# Patient Record
Sex: Female | Born: 2000 | Race: Black or African American | Hispanic: No | Marital: Single | Smoking: Never smoker
Health system: Southern US, Community
[De-identification: ages and names within clinical notes are randomized; demographics above are authoritative.]

## PROBLEM LIST (undated history)

## (undated) ENCOUNTER — Inpatient Hospital Stay (HOSPITAL_COMMUNITY): Payer: Self-pay

## (undated) DIAGNOSIS — R55 Syncope and collapse: Secondary | ICD-10-CM

## (undated) DIAGNOSIS — N739 Female pelvic inflammatory disease, unspecified: Secondary | ICD-10-CM

## (undated) DIAGNOSIS — R0600 Dyspnea, unspecified: Secondary | ICD-10-CM

## (undated) DIAGNOSIS — A749 Chlamydial infection, unspecified: Secondary | ICD-10-CM

## (undated) DIAGNOSIS — F419 Anxiety disorder, unspecified: Secondary | ICD-10-CM

## (undated) DIAGNOSIS — N96 Recurrent pregnancy loss: Secondary | ICD-10-CM

## (undated) DIAGNOSIS — F32A Depression, unspecified: Secondary | ICD-10-CM

## (undated) HISTORY — DX: Recurrent pregnancy loss: N96

## (undated) HISTORY — DX: Chlamydial infection, unspecified: A74.9

## (undated) HISTORY — DX: Female pelvic inflammatory disease, unspecified: N73.9

## (undated) HISTORY — DX: Anxiety disorder, unspecified: F41.9

## (undated) HISTORY — PX: NO PAST SURGERIES: SHX2092

---
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
## 2018-06-21 ENCOUNTER — Encounter: Payer: Self-pay | Admitting: Obstetrics & Gynecology

## 2018-06-21 ENCOUNTER — Ambulatory Visit (INDEPENDENT_AMBULATORY_CARE_PROVIDER_SITE_OTHER): Payer: BLUE CROSS/BLUE SHIELD | Admitting: Obstetrics & Gynecology

## 2018-06-21 ENCOUNTER — Encounter: Payer: BLUE CROSS/BLUE SHIELD | Admitting: Obstetrics & Gynecology

## 2018-06-21 ENCOUNTER — Other Ambulatory Visit: Payer: Self-pay

## 2018-06-21 VITALS — BP 102/58 | HR 76 | Resp 18 | Ht 68.5 in | Wt 130.4 lb

## 2018-06-21 DIAGNOSIS — N926 Irregular menstruation, unspecified: Secondary | ICD-10-CM | POA: Diagnosis not present

## 2018-06-21 DIAGNOSIS — Z8619 Personal history of other infectious and parasitic diseases: Secondary | ICD-10-CM | POA: Diagnosis not present

## 2018-06-21 DIAGNOSIS — N946 Dysmenorrhea, unspecified: Secondary | ICD-10-CM

## 2018-06-21 LAB — POCT URINE PREGNANCY: Preg Test, Ur: NEGATIVE

## 2018-06-21 MED ORDER — TRANEXAMIC ACID 650 MG PO TABS: ORAL_TABLET | ORAL | 1 refills | Status: DC

## 2018-06-21 NOTE — Progress Notes (Signed)
17 y.o. G0P0000 Single Black or [place] American female here for new patient annual exam.  Cycles are very irregular.  Can have two cycles in a month and has gone up to 8 weeks between cycles.  Cramping is very severe when she is on her cycle.  Bleeds for 7 full days when she is on her cycle.  With the beginning of her cycle, she will have significant cramping for two to three days.  This is typically when she has the heaviest bleeding.  She will have to change pads every two hours.  Has tried OCPs twice in the past.  Didn't take the pills regularly.  States this did help cycles quite a bit.  Overall, she took the pills for over a year but did miss pills often.  Tries her best to not take medication when she cramps.  Has taken Tramadol and Motrin.  Tramadol has helped the most.    Does have some concerns about being on birth control.  Consider  Currently SA.  Has two partners in the past.  Has hx of chlamydia.  This was 10/18.  Did have repeat testing at Urgent Care but cannot remember which one.  Partner (of five years) was tested but never treated as he was negative.  She had pelvic pain at that time and was diagnosed with PID at that time as well.    Currently not working.  Wants to start school in January for real estate in January.  Will then take her license exam    Patient's last menstrual period was 05/22/2018 (approximate).          Sexually active: Yes.    The current method of family planning is none.    Exercising: No.   Smoker: yes  Health Maintenance: Pap:  Never Gardasil: not completed TDaP:  2010 Screening Labs: PCP   reports that she has been smoking cigarettes. She has never used smokeless tobacco. She reports that she does not drink alcohol or use drugs.  Past Medical History:  Diagnosis Date  . Anxiety   . Chlamydia   . PID (pelvic inflammatory disease)   . Syncope     Past Surgical History:  Procedure Laterality Date  . NO PAST SURGERIES      Current Outpatient  Medications  Medication Sig Dispense Refill  . Biotin 10 MG CAPS Take by mouth daily.    . Multiple Vitamins-Minerals (HAIR SKIN AND NAILS FORMULA PO) Take by mouth daily.     No current facility-administered medications for this visit.     Family History  Problem Relation Age of Onset  . Pancreatitis Mother   . Heart disease Mother   . Pancreatic cancer Mother   . Diabetes Father   . Leukemia Father   . Healthy Brother   . Alzheimer's disease Maternal Grandmother     Review of Systems  All other systems reviewed and are negative.   Exam:   BP (!) 102/58 (BP Location: Right Arm, Patient Position: Sitting, Cuff Size: Normal)   Pulse 76   Resp 18   Ht 5' 8.5" (1.74 m)   Wt 130 lb 6.4 oz (59.1 kg)   LMP 05/22/2018 (Approximate)   BMI 19.54 kg/m   Height: 5' 8.5" (174 cm)  Ht Readings from Last 3 Encounters:  06/21/18 5' 8.5" (1.74 m) (95 %, Z= 1.65)*  03/16/17 5\' 6"  (1.676 m) (75 %, Z= 0.69)*   * Growth percentiles are based on CDC (Girls, 2-20 Years) data.  General appearance: alert, cooperative and appears stated age Head: Normocephalic, without obvious abnormality, atraumatic Lungs: clear to auscultation bilaterally Heart: regular rate and rhythm Neurologic: Grossly normal  Pelvic:not performed today  A:  Dysmenorrhea, desires Tramadol H/O chlamydia  P:   Options for treatment discussed including OCPs, POPs, depo provera, IUDs, Nexplanon, Nuva ring and Evra patch.  Concerns addressed.  She has some mis-information that she read that we did discuss.  Wants to do some additional research D/w pt Tramadol medication and my concerns about her using this monthly and long term.  Will not plan to use this at this time. Will try Lysteda 1300mg  tid with bleeding and until bleeding stops.  #30/1RF.  Pt will give update.   GC/Chl/trich testing obtained  ~30 minutes spent with patient >50% of time was in face to face discussion of above.

## 2018-06-22 LAB — CHLAMYDIA/GONOCOCCUS/TRICHOMONAS, NAA
Chlamydia by NAA: NEGATIVE
Gonococcus by NAA: NEGATIVE
Trich vag by NAA: NEGATIVE

## 2018-06-24 ENCOUNTER — Telehealth: Payer: Self-pay | Admitting: Obstetrics & Gynecology

## 2018-06-24 MED ORDER — NAPROXEN 500 MG PO TABS: 500.0000 mg | ORAL_TABLET | Freq: Two times a day (BID) | ORAL | 0 refills | Status: DC

## 2018-06-24 NOTE — Telephone Encounter (Signed)
Call to patient. Patient states that she started her cycle this morning around 4 am and that the "cramps have been really bad. I'm not able to do anything." Patient states she has thrown up twice and is not able to eat anything. Patient states she took the lysteda around 11am. States the medicine "helped the bleeding, but didn't do anything for the cramps." She is changing her pad about every 2-3 hours and not saturated when she changes. OV offered to patient, but she declined wondering if there is anything Dr. Hyacinth Meeker can prescribe for pain? RN advised would need to review with Dr. Hyacinth Meeker and return call. Patient agreeable. Bleeding/pain precautions reviewed with patient and she verbalized understanding.   Routing to provider for review.

## 2018-06-24 NOTE — Telephone Encounter (Signed)
Patient would like to discuss her cycle with a nurse.

## 2018-06-24 NOTE — Telephone Encounter (Signed)
She needs to keep taking the Lysteda 3 times daily.  She wants Tramadol for the pain which I do not feel comfortable prescribing for her.  We could try Naprosyn 500mg  bid #30/0RF.  She declined all other treatment options including OCPs, POPs, other progesterone methods and IUD use.

## 2018-06-24 NOTE — Telephone Encounter (Signed)
Call to patient. Message given to patient as seen below from Dr. Hyacinth Meeker. Patient verbalized understanding. Pharmacy confirmed as [facility] on [address]. Instructions on use of naprosyn reviewed with patient and she verbalized understanding. Advised patient that should pain continue/worsen, patient would need to seek Urgent/ER care. Patient verbalized understanding and agreeable.   RX for Naprosyn 500mg  bid, #30/0RF sent to [facility] on [address] per patient request.   Routing to provider and will close encounter.

## 2019-03-28 ENCOUNTER — Ambulatory Visit: Payer: Self-pay | Admitting: Internal Medicine

## 2019-03-28 ENCOUNTER — Encounter: Payer: Self-pay | Admitting: Internal Medicine

## 2019-03-28 DIAGNOSIS — R21 Rash and other nonspecific skin eruption: Secondary | ICD-10-CM

## 2019-03-28 NOTE — Progress Notes (Signed)
Patient ID: Sydney Woods, female   DOB: 12-13-2000, 18 y.o.   MRN: [id number]  Error  = pt no show for appt

## 2019-03-28 NOTE — Patient Instructions (Signed)
Error = pt no show for appt

## 2019-08-02 ENCOUNTER — Ambulatory Visit (HOSPITAL_COMMUNITY)
Admission: EM | Admit: 2019-08-02 | Discharge: 2019-08-02 | Disposition: A | Discharge status: Home / Self Care | Payer: BC Managed Care – PPO | Attending: Emergency Medicine | Admitting: Emergency Medicine

## 2019-08-02 ENCOUNTER — Other Ambulatory Visit: Payer: Self-pay

## 2019-08-02 NOTE — ED Notes (Signed)
Sydney Woods, patient access , reports patient is going to her provider.

## 2019-08-03 ENCOUNTER — Other Ambulatory Visit: Payer: Self-pay

## 2019-08-04 ENCOUNTER — Ambulatory Visit (INDEPENDENT_AMBULATORY_CARE_PROVIDER_SITE_OTHER): Payer: BC Managed Care – PPO | Admitting: Certified Nurse Midwife

## 2019-08-04 ENCOUNTER — Other Ambulatory Visit: Payer: Self-pay

## 2019-08-04 ENCOUNTER — Encounter: Payer: Self-pay | Admitting: Certified Nurse Midwife

## 2019-08-04 VITALS — BP 118/70 | HR 72 | Temp 97.2°F | Resp 16 | Wt 125.0 lb

## 2019-08-04 DIAGNOSIS — N912 Amenorrhea, unspecified: Secondary | ICD-10-CM | POA: Diagnosis not present

## 2019-08-04 LAB — POCT URINE PREGNANCY: Preg Test, Ur: POSITIVE — AB

## 2019-08-04 NOTE — Patient Instructions (Signed)
Morning Sickness  Morning sickness is when you feel sick to your stomach (nauseous) during pregnancy. You may feel sick to your stomach and throw up (vomit). You may feel sick in the morning, but you can feel this way at any time of day. Some women feel very sick to their stomach and cannot stop throwing up (hyperemesis gravidarum). Follow these instructions at home: Medicines  Take over-the-counter and prescription medicines only as told by your doctor. Do not take any medicines until you talk with your doctor about them first.  Taking multivitamins before getting pregnant can stop or lessen the harshness of morning sickness. Eating and drinking  Eat dry toast or crackers before getting out of bed.  Eat 5 or 6 small meals a day.  Eat dry and bland foods like [place] and baked potatoes.  Do not eat greasy, fatty, or spicy foods.  Have someone cook for you if the smell of food causes you to feel sick or throw up.  If you feel sick to your stomach after taking prenatal vitamins, take them at night or with a snack.  Eat protein when you need a snack. Nuts, yogurt, and cheese are good choices.  Drink fluids throughout the day.  Try ginger ale made with real ginger, ginger tea made from fresh grated ginger, or ginger candies. General instructions  Do not use any products that have nicotine or tobacco in them, such as cigarettes and e-cigarettes. If you need help quitting, ask your doctor.  Use an air purifier to keep the air in your house free of smells.  Get lots of fresh air.  Try to avoid smells that make you feel sick.  Try: ? Wearing a bracelet that is used for seasickness (acupressure wristband). ? Going to a doctor who puts thin needles into certain body points (acupuncture) to improve how you feel. Contact a doctor if:  You need medicine to feel better.  You feel dizzy or light-headed.  You are losing weight. Get help right away if:  You feel very sick to your  stomach and cannot stop throwing up.  You pass out (faint).  You have very bad pain in your belly. Summary  Morning sickness is when you feel sick to your stomach (nauseous) during pregnancy.  You may feel sick in the morning, but you can feel this way at any time of day.  Making some changes to what you eat may help your symptoms go away. This information is not intended to replace advice given to you by your health care provider. Make sure you discuss any questions you have with your health care provider. Document Released: 10/08/2004 Document Revised: 08/13/2017 Document Reviewed: 10/01/2016 Elsevier Patient Education  2020 Elsevier Inc.  

## 2019-08-04 NOTE — Progress Notes (Signed)
18 y.o. single african [place] female g0p0 presents with amenorrhea with + UPT on 08/01/2019 and then on 08/02/2019. + UPT here today. LMP End of September , but not sure of date. Had no period in 06/2019. Has not used contraception in entire time with partner of 7 years. Complaining of breast tenderness, fatigue, nausea with one occasion of vomiting. Denies spotting, bleeding or cramping. Medications she is taking are prenatal vitamins, recently started. Patient has not consumed alcohol since + UPT. Has smoked marijuana in the past, but not since + UPT. Partner supportive and excited. Anxious to know how far along her pregnancy is. No other concerns today.  Review of Systems  Constitutional: Negative.   HENT: Negative.   Eyes: Negative.   Respiratory: Negative.   Cardiovascular: Negative.   Gastrointestinal: Positive for nausea.       Vomiting x 1 only  Genitourinary: Negative.   Musculoskeletal: Negative.   Skin: Negative.   Neurological: Negative.   Endo/Heme/Allergies: Negative.   Psychiatric/Behavioral: Negative.     O: HPI pertinent to above. Healthy WDWN female Affect: normal, orientation x 3  Last OV: 06/21/18 only for dysmenorrhea Pap smear: not file     Rubella screen: not file  A: Amenorrhea with positive UPT  07/01/2019   ?? 4- 6 weeks per  ? LNMP  Unplanned pregnancy, but excited, partner supportive History of Chlamydia and PID, needs early confirmation in uterus to risk of ectopic.   P: Reviewed with patient importance of prenatal care during pregnancy. Given OB provider list. Reviewed nutrition importance of pregnancy and selecting from all food groups and making sure to have adequate protein intake daily. Discussed avoiding raw or exotic fish, soft cheeses due to risk of bacteria . Discussed concerns with FAS with alcohol use in pregnancy. Discussed increase of IUGR and SIDS with smoking use or second smoke. Reviewed warning signs of early pregnancy and need to advise  if occurs. Discussed comfort measures for early pregnancy changes, such as nausea and vomiting. Discussed small frequent meals better tolerated. If concerns with vomiting needs to advise. Offered  PUS here prior to determine dating prior to initiating prenatal care. Patient would like  to have PUS. She will be called with insurance information and scheduled. Questions addressed at length.   Rv prn         38 minutes spent in time spent with patient in face to face counseling regarding pregnancy, prenatal care, and comfort measures.

## 2019-08-05 ENCOUNTER — Emergency Department (HOSPITAL_COMMUNITY)
Admission: EM | Admit: 2019-08-05 | Discharge: 2019-08-05 | Disposition: A | Discharge status: Home / Self Care | Payer: BC Managed Care – PPO | Attending: Emergency Medicine | Admitting: Emergency Medicine

## 2019-08-05 ENCOUNTER — Emergency Department (HOSPITAL_COMMUNITY): Payer: BC Managed Care – PPO

## 2019-08-05 ENCOUNTER — Other Ambulatory Visit: Payer: Self-pay

## 2019-08-05 ENCOUNTER — Encounter (HOSPITAL_COMMUNITY): Payer: Self-pay

## 2019-08-05 DIAGNOSIS — Z3A01 Less than 8 weeks gestation of pregnancy: Secondary | ICD-10-CM | POA: Insufficient documentation

## 2019-08-05 DIAGNOSIS — Z79899 Other long term (current) drug therapy: Secondary | ICD-10-CM | POA: Insufficient documentation

## 2019-08-05 DIAGNOSIS — Z87891 Personal history of nicotine dependence: Secondary | ICD-10-CM | POA: Diagnosis not present

## 2019-08-05 DIAGNOSIS — B9689 Other specified bacterial agents as the cause of diseases classified elsewhere: Secondary | ICD-10-CM | POA: Insufficient documentation

## 2019-08-05 DIAGNOSIS — O200 Threatened abortion: Secondary | ICD-10-CM | POA: Diagnosis not present

## 2019-08-05 DIAGNOSIS — O209 Hemorrhage in early pregnancy, unspecified: Secondary | ICD-10-CM | POA: Diagnosis not present

## 2019-08-05 DIAGNOSIS — O23591 Infection of other part of genital tract in pregnancy, first trimester: Secondary | ICD-10-CM | POA: Diagnosis not present

## 2019-08-05 DIAGNOSIS — O23599 Infection of other part of genital tract in pregnancy, unspecified trimester: Secondary | ICD-10-CM | POA: Insufficient documentation

## 2019-08-05 DIAGNOSIS — N939 Abnormal uterine and vaginal bleeding, unspecified: Secondary | ICD-10-CM

## 2019-08-05 LAB — CBC
HCT: 43.1 % (ref 36.0–46.0)
Hemoglobin: 14.2 g/dL (ref 12.0–15.0)
MCH: 29.8 pg (ref 26.0–34.0)
MCHC: 32.9 g/dL (ref 30.0–36.0)
MCV: 90.5 fL (ref 80.0–100.0)
Platelets: 287 10*3/uL (ref 150–400)
RBC: 4.76 MIL/uL (ref 3.87–5.11)
RDW: 13.4 % (ref 11.5–15.5)
WBC: 5.2 10*3/uL (ref 4.0–10.5)
nRBC: 0 % (ref 0.0–0.2)

## 2019-08-05 LAB — URINALYSIS, ROUTINE W REFLEX MICROSCOPIC
Bilirubin Urine: NEGATIVE
Glucose, UA: NEGATIVE mg/dL
Hgb urine dipstick: NEGATIVE
Ketones, ur: 5 mg/dL — AB
Leukocytes,Ua: NEGATIVE
Nitrite: NEGATIVE
Protein, ur: NEGATIVE mg/dL
Specific Gravity, Urine: 1.018 (ref 1.005–1.030)
pH: 5 (ref 5.0–8.0)

## 2019-08-05 LAB — BASIC METABOLIC PANEL
Anion gap: 12 (ref 5–15)
BUN: 9 mg/dL (ref 6–20)
CO2: 20 mmol/L — ABNORMAL LOW (ref 22–32)
Calcium: 10 mg/dL (ref 8.9–10.3)
Chloride: 104 mmol/L (ref 98–111)
Creatinine, Ser: 0.76 mg/dL (ref 0.44–1.00)
GFR calc Af Amer: >60 mL/min (ref >60)
GFR calc non Af Amer: >60 mL/min (ref >60)
Glucose, Bld: 80 mg/dL (ref 70–99)
Potassium: 3.4 mmol/L — ABNORMAL LOW (ref 3.5–5.1)
Sodium: 136 mmol/L (ref 135–145)

## 2019-08-05 LAB — HCG, QUANTITATIVE, PREGNANCY: hCG, Beta Chain, Quant, S: 7675 m[IU]/mL — ABNORMAL HIGH (ref <5)

## 2019-08-05 LAB — WET PREP, GENITAL
Sperm: NONE SEEN
Trich, Wet Prep: NONE SEEN
Yeast Wet Prep HPF POC: NONE SEEN

## 2019-08-05 LAB — HIV ANTIBODY (ROUTINE TESTING W REFLEX): HIV Screen 4th Generation wRfx: NONREACTIVE

## 2019-08-05 LAB — ABO/RH: ABO/RH(D): O POS

## 2019-08-05 MED ORDER — METRONIDAZOLE 500 MG PO TABS: 500.0000 mg | ORAL_TABLET | Freq: Two times a day (BID) | ORAL | 0 refills | Status: DC

## 2019-08-05 NOTE — ED Triage Notes (Signed)
Pt presents with c/o vaginal bleeding and some cramping. Pt reports that she is pregnant, just recently found out.

## 2019-08-05 NOTE — ED Provider Notes (Signed)
[facility]-EMERGENCY DEPT Provider Note   CSN: [id number] Arrival date & time: 08/05/19  1132     History   Chief Complaint Chief Complaint  Patient presents with   Vaginal Bleeding    HPI Sydney Woods is a 18 y.o. female.     HPI Patient presents with vaginal bleeding and cramping.  Recently found out she is pregnant.  Including confirmatory pregnancy test yesterday.  Last menses was around 2 months ago.  However today had some mild vaginal spotting and had some cramping.  No lightheadedness weakness.  Did not have pelvic exam done yesterday.  Does not know her or her boyfriend's blood type.  This is her first pregnancy. Past Medical History:  Diagnosis Date   Anxiety    Chlamydia    PID (pelvic inflammatory disease)    Syncope     Patient Active Problem List   Diagnosis Date Noted   Rash and nonspecific skin eruption 12/14/2017   Microscopic hematuria 04/13/2017   Cervicitis 04/13/2017   History of chlamydia 04/13/2017   Menstrual cramps 03/16/2017   Syncope 10/23/2012    Past Surgical History:  Procedure Laterality Date   NO PAST SURGERIES       OB History    Gravida  1   Para  0   Term  0   Preterm  0   AB  0   Living  0     SAB  0   TAB  0   Ectopic  0   Multiple  0   Live Births  0            Home Medications    Prior to Admission medications   Medication Sig Start Date End Date Taking? Authorizing Provider  Prenatal Vit-Fe Fumarate-FA (PRENATAL VITAMIN PO) Take by mouth.   Yes [provider]  metroNIDAZOLE (FLAGYL) 500 MG tablet Take 1 tablet (500 mg total) by mouth 2 (two) times daily. 08/05/19   Benjiman CorePickering, Ignace Mandigo, MD    Family History Family History  Problem Relation Age of Onset   Pancreatitis Mother    Heart disease Mother    Pancreatic cancer Mother    Diabetes Father    Leukemia Father    Healthy Brother    Alzheimer's disease Maternal Grandmother     Social  History Social History   Tobacco Use   Smoking status: Former Smoker    Types: Cigarettes   Smokeless tobacco: Never Used  Substance Use Topics   Alcohol use: No   Drug use: No     Allergies   Tomato flavor [flavoring agent] and Citrus   Review of Systems Review of Systems  Constitutional: Negative for appetite change.  Respiratory: Negative for shortness of breath.   Gastrointestinal: Positive for abdominal pain.  Genitourinary: Positive for vaginal bleeding.  Musculoskeletal: Negative for arthralgias.  Skin: Negative for pallor.  Neurological: Negative for weakness.  Psychiatric/Behavioral: Negative for agitation.     Physical Exam Updated Vital Signs BP 131/72    Pulse 83    Temp 99 F (37.2 C) (Oral)    Resp 16    Ht 5\' 8"  (1.727 m)    Wt 56.7 kg    LMP 06/09/2019 (Approximate)    SpO2 90%    BMI 19.01 kg/m   Physical Exam Vitals signs and nursing note reviewed.  HENT:     Head: Normocephalic.  Eyes:     Pupils: Pupils are equal, round, and reactive to light.  Cardiovascular:     Rate and Rhythm: Regular rhythm.  Abdominal:     Tenderness: There is no abdominal tenderness.  Musculoskeletal:     Right lower leg: No edema.     Left lower leg: No edema.  Skin:    General: Skin is warm.     Capillary Refill: Capillary refill takes less than 2 seconds.  Neurological:     Mental Status: She is alert and oriented to person, place, and time.   Pelvic exam showed closed os.  Some cervical bleeding.  No cervical motion tenderness.   ED Treatments / Results  Labs (all labs ordered are listed, but only abnormal results are displayed) Labs Reviewed  WET PREP, GENITAL - Abnormal; Notable for the following components:      Result Value   Clue Cells Wet Prep HPF POC PRESENT (*)    WBC, Wet Prep HPF POC FEW (*)    All other components within normal limits  HCG, QUANTITATIVE, PREGNANCY - Abnormal; Notable for the following components:   hCG, Beta Chain,  Quant, S 7,675 (*)    All other components within normal limits  BASIC METABOLIC PANEL - Abnormal; Notable for the following components:   Potassium 3.4 (*)    CO2 20 (*)    All other components within normal limits  URINALYSIS, ROUTINE W REFLEX MICROSCOPIC - Abnormal; Notable for the following components:   Ketones, ur 5 (*)    All other components within normal limits  CBC  RPR  HIV ANTIBODY (ROUTINE TESTING W REFLEX)  ABO/RH  GC/CHLAMYDIA PROBE AMP ([place]) NOT AT [facility]    EKG None  Radiology US Ob Comp < 14 Wks  Result Date: 08/05/2019 CLINICAL DATA:  18 year old female reportedly pregnant with vaginal bleeding. Quantitative beta HCG in process. EXAM: OBSTETRIC <14 WK [place] AND TRANSVAGINAL OB US DOPPLER ULTRASOUND OF OVARIES TECHNIQUE: Both transabdominal and transvaginal ultrasound examinations were performed for complete evaluation of the gestation as well as the maternal uterus, adnexal regions, and pelvic cul-de-sac. Transvaginal technique was performed to assess early pregnancy. Color and duplex Doppler ultrasound was utilized to evaluate blood flow to the ovaries. COMPARISON:  None. FINDINGS: Intrauterine gestational sac: Single Yolk sac:  Not Visualized. Embryo:  Not Visualized. Cardiac Activity: Not Visualized. MSD: 7.6 mm   5 w   3 d Subchorionic hemorrhage:  None visualized. Maternal uterus/adnexae: Small amount of fluid visualized within the cervix consistent with the clinical history of vaginal bleeding. The endometrium is heterogeneously thickened. Simple follicular cyst present in the left ovary measuring up to 2.0 cm. Trace free fluid in the pelvic cul-de-sac, likely physiologic. Pulsed Doppler evaluation of both ovaries demonstrates normal appearing low-resistance arterial and venous waveforms. IMPRESSION: Probable early intrauterine gestational sac, but no yolk sac, fetal pole, or cardiac activity yet visualized. Recommend follow-up quantitative B-HCG levels and  follow-up US in 14 days to assess viability. This recommendation follows SRU consensus guidelines: Diagnostic Criteria for Nonviable Pregnancy Early in the First Trimester. [place] Med 2013; 376:2831-51. Electronically Signed   By: Jacqulynn Cadet M.D.   On: 08/05/2019 13:12   US Ob Transvaginal  Result Date: 08/05/2019 CLINICAL DATA:  18 year old female reportedly pregnant with vaginal bleeding. Quantitative beta HCG in process. EXAM: OBSTETRIC <14 WK [place] AND TRANSVAGINAL OB US DOPPLER ULTRASOUND OF OVARIES TECHNIQUE: Both transabdominal and transvaginal ultrasound examinations were performed for complete evaluation of the gestation as well as the maternal uterus, adnexal regions, and pelvic cul-de-sac. Transvaginal technique was  performed to assess early pregnancy. Color and duplex Doppler ultrasound was utilized to evaluate blood flow to the ovaries. COMPARISON:  None. FINDINGS: Intrauterine gestational sac: Single Yolk sac:  Not Visualized. Embryo:  Not Visualized. Cardiac Activity: Not Visualized. MSD: 7.6 mm   5 w   3 d Subchorionic hemorrhage:  None visualized. Maternal uterus/adnexae: Small amount of fluid visualized within the cervix consistent with the clinical history of vaginal bleeding. The endometrium is heterogeneously thickened. Simple follicular cyst present in the left ovary measuring up to 2.0 cm. Trace free fluid in the pelvic cul-de-sac, likely physiologic. Pulsed Doppler evaluation of both ovaries demonstrates normal appearing low-resistance arterial and venous waveforms. IMPRESSION: Probable early intrauterine gestational sac, but no yolk sac, fetal pole, or cardiac activity yet visualized. Recommend follow-up quantitative B-HCG levels and follow-up US in 14 days to assess viability. This recommendation follows SRU consensus guidelines: Diagnostic Criteria for Nonviable Pregnancy Early in the First Trimester. Malva Limes Med 2013; 161:0960-45. Electronically Signed   By: Malachy Moan  M.D.   On: 08/05/2019 13:12   US Pelvic Doppler (torsion R/o Or Mass Arterial Flow)  Result Date: 08/05/2019 CLINICAL DATA:  18 year old female reportedly pregnant with vaginal bleeding. Quantitative beta HCG in process. EXAM: OBSTETRIC <14 WK [place] AND TRANSVAGINAL OB US DOPPLER ULTRASOUND OF OVARIES TECHNIQUE: Both transabdominal and transvaginal ultrasound examinations were performed for complete evaluation of the gestation as well as the maternal uterus, adnexal regions, and pelvic cul-de-sac. Transvaginal technique was performed to assess early pregnancy. Color and duplex Doppler ultrasound was utilized to evaluate blood flow to the ovaries. COMPARISON:  None. FINDINGS: Intrauterine gestational sac: Single Yolk sac:  Not Visualized. Embryo:  Not Visualized. Cardiac Activity: Not Visualized. MSD: 7.6 mm   5 w   3 d Subchorionic hemorrhage:  None visualized. Maternal uterus/adnexae: Small amount of fluid visualized within the cervix consistent with the clinical history of vaginal bleeding. The endometrium is heterogeneously thickened. Simple follicular cyst present in the left ovary measuring up to 2.0 cm. Trace free fluid in the pelvic cul-de-sac, likely physiologic. Pulsed Doppler evaluation of both ovaries demonstrates normal appearing low-resistance arterial and venous waveforms. IMPRESSION: Probable early intrauterine gestational sac, but no yolk sac, fetal pole, or cardiac activity yet visualized. Recommend follow-up quantitative B-HCG levels and follow-up US in 14 days to assess viability. This recommendation follows SRU consensus guidelines: Diagnostic Criteria for Nonviable Pregnancy Early in the First Trimester. Malva Limes Med 2013; 409:8119-14. Electronically Signed   By: Malachy Moan M.D.   On: 08/05/2019 13:12    Procedures Procedures (including critical care time)  Medications Ordered in ED Medications - No data to display   Initial Impression / Assessment and Plan / ED Course  I  have reviewed the triage vital signs and the nursing notes.  Pertinent labs & imaging results that were available during my care of the patient were reviewed by me and considered in my medical decision making (see chart for details).        Patient presents with vaginal bleeding and cramping. Known pregnancy test but not had quantitative done.  Sydney Woods is 7600.  Ultrasound showed potential intrauterine pregnancy but no yolk sac.  Has bacterial vaginosis.  Will treat.  Urine does not show infection.  Will have follow-up with GYN/OB  Final Clinical Impressions(s) / ED Diagnoses   Final diagnoses:  Threatened miscarriage  Bacterial vaginosis in pregnancy    ED Discharge Orders         Ordered  metroNIDAZOLE (FLAGYL) 500 MG tablet  2 times daily     08/05/19 1422           Benjiman Core, MD 08/05/19 1436

## 2019-08-05 NOTE — ED Notes (Signed)
Urine culture sent down to lab with urinalysis. 

## 2019-08-06 LAB — RPR: RPR Ser Ql: NONREACTIVE

## 2019-08-07 ENCOUNTER — Other Ambulatory Visit: Payer: Self-pay

## 2019-08-07 ENCOUNTER — Other Ambulatory Visit: Payer: Self-pay | Admitting: Certified Nurse Midwife

## 2019-08-07 ENCOUNTER — Other Ambulatory Visit (INDEPENDENT_AMBULATORY_CARE_PROVIDER_SITE_OTHER): Payer: BC Managed Care – PPO

## 2019-08-07 ENCOUNTER — Telehealth: Payer: Self-pay | Admitting: *Deleted

## 2019-08-07 ENCOUNTER — Telehealth: Payer: Self-pay | Admitting: Certified Nurse Midwife

## 2019-08-07 DIAGNOSIS — N926 Irregular menstruation, unspecified: Secondary | ICD-10-CM

## 2019-08-07 DIAGNOSIS — Z3201 Encounter for pregnancy test, result positive: Secondary | ICD-10-CM

## 2019-08-07 DIAGNOSIS — O209 Hemorrhage in early pregnancy, unspecified: Secondary | ICD-10-CM

## 2019-08-07 DIAGNOSIS — O0281 Inappropriate change in quantitative human chorionic gonadotropin (hCG) in early pregnancy: Secondary | ICD-10-CM

## 2019-08-07 LAB — BETA HCG QUANT (REF LAB): hCG Quant: 6673 m[IU]/mL

## 2019-08-07 NOTE — Telephone Encounter (Signed)
Spoke with pts mother. Ok per [employer]. Pt in court as we spoke. Mother Kennyth Lose)  states pt needs f/u appt from ED on Saturday for bleeding with pregnancy. Spoke with Debbi, CNM and pt needs STAT beta HCG today. Lab visit scheduled 11/23 at 1:45pm per pts request bc in court this morning. Pt to call back when out of court to talk to triage to evaluate sx and to confirm lab visit. Pts mother agreeable.   Will route to French Ana, CNM for review. STAT beta HCG order placed.

## 2019-08-07 NOTE — Telephone Encounter (Signed)
Pt called back. Spoke with pt. Pt states having nausea and vomiting x 2 yesterday. Pt states spotting/ light flow and dark in color. Pt has been in court this morning and wasn't able to have lab appt for STAT Beta HCG until this afternoon. Confirmed lab appt 11/23 at 1:45 and arrival at 130pm Pt agreeable.  Pt states having been dx with BV on Sat at ED. Has been prescribed Flagyl. Pt states has only taken 1 pill yesterday bc of vomiting. Please advise on med.   Will route to D. Hollice Espy, CNM

## 2019-08-07 NOTE — Telephone Encounter (Signed)
She needs to stop medication, I do not recommend Flagyl in early pregnancy

## 2019-08-07 NOTE — Telephone Encounter (Signed)
Patient was seen at the ED department on Saturday for bleeding with pregnancy.

## 2019-08-07 NOTE — Telephone Encounter (Signed)
Left message to call Sharee Pimple, RN at [facility].    OV 08/04/19: positive UPT ER 08/05/19; HCG 1,157 PUS: IMPRESSION: Probable early intrauterine gestational sac, but no yolk sac, fetal pole, or cardiac activity yet visualized. Recommend follow-up quantitative B-HCG levels and follow-up US in 14 days to assess viability. This recommendation follows SRU consensus guidelines: Diagnostic Criteria for Nonviable Pregnancy Early in the First Trimester.   Hx: Chlamydia and PID O positive

## 2019-08-07 NOTE — Telephone Encounter (Signed)
Pt in office lab getting Stat beta hCG drawn. Spoke with pt. Pt instructed to stop taking Flagyl d/t early pregnancy. Pt verbalized understanding. Pt spoken to in office by D. Hollice Espy, CNM to go over heavy bleeding precautions re: spotting that pt reports. Pt verbalized understanding. Will call pt back when STAT beta hCG results in and make further plan of care. Pt agreeable.   Will route to D. Hollice Espy, CNM for review and close encounter.

## 2019-08-07 NOTE — Telephone Encounter (Signed)
STAT Beta Hcg [id number].  Reviewed with Melvia Heaps, CNM, call placed to patient. Repeat STAT beta Hcg scheduled for 08/09/19 at 8:55am. ER precautions reviewed for heavy bleeding, pain, N/V. Patient verbalizes understanding and is agreeable.   Future lab order placed.   Routing to provider for final review. Patient is agreeable to disposition. Will close encounter.  Cc: Dr. Quincy Simmonds

## 2019-08-08 ENCOUNTER — Telehealth: Payer: Self-pay | Admitting: *Deleted

## 2019-08-08 ENCOUNTER — Other Ambulatory Visit (HOSPITAL_COMMUNITY)
Admission: RE | Admit: 2019-08-08 | Discharge: 2019-08-08 | Disposition: A | Discharge status: Home / Self Care | Payer: BC Managed Care – PPO | Source: Ambulatory Visit | Attending: Obstetrics and Gynecology | Admitting: Obstetrics and Gynecology

## 2019-08-08 ENCOUNTER — Ambulatory Visit (INDEPENDENT_AMBULATORY_CARE_PROVIDER_SITE_OTHER): Payer: BC Managed Care – PPO | Admitting: Obstetrics and Gynecology

## 2019-08-08 ENCOUNTER — Encounter: Payer: Self-pay | Admitting: Obstetrics and Gynecology

## 2019-08-08 ENCOUNTER — Other Ambulatory Visit: Payer: Self-pay

## 2019-08-08 ENCOUNTER — Encounter (HOSPITAL_BASED_OUTPATIENT_CLINIC_OR_DEPARTMENT_OTHER): Payer: Self-pay | Admitting: *Deleted

## 2019-08-08 VITALS — BP 122/74 | HR 80 | Temp 97.3°F | Ht 68.5 in | Wt 122.0 lb

## 2019-08-08 DIAGNOSIS — Z01812 Encounter for preprocedural laboratory examination: Secondary | ICD-10-CM | POA: Diagnosis not present

## 2019-08-08 DIAGNOSIS — O021 Missed abortion: Secondary | ICD-10-CM

## 2019-08-08 DIAGNOSIS — Z20828 Contact with and (suspected) exposure to other viral communicable diseases: Secondary | ICD-10-CM | POA: Insufficient documentation

## 2019-08-08 LAB — GC/CHLAMYDIA PROBE AMP (~~LOC~~) NOT AT ARMC
Chlamydia: NEGATIVE
Neisseria Gonorrhea: NEGATIVE

## 2019-08-08 LAB — SARS CORONAVIRUS 2 (TAT 6-24 HRS): SARS Coronavirus 2: NEGATIVE

## 2019-08-08 NOTE — Telephone Encounter (Signed)
Spoke with patient. Patient reports dk blood on tissue when wiping this morning, no heavy bleeding. Denies pain, N/V, fever/chills. OV scheduled with Dr. Quincy Simmonds at 11:30am. Will keep lab appt as scheduled for tomorrow, will cancel while in office, if needed, after seeing Dr. Quincy Simmonds. [id number] prescreen negative.   Routing to provider for final review. Patient is agreeable to disposition. Will close encounter.  Cc: Melvia Heaps, CNM

## 2019-08-08 NOTE — Progress Notes (Signed)
Spoke w/ via phone for pre-op interview---[facility] Lab needs dos----   CBC           Lab results------ COVID test ------COVID 08-08-2019 Arrive at -------1200 PM 08-09-2019 NPO after ------MIDNIGHT FOOD, CLEAR LIQUIDS UNTIL 800 AM THEN NPO Medications to take morning of surgery -----NONE Diabetic medication -----N/A Patient Special Instructions ----- Pre-Op special Istructions ----- Patient verbalized understanding of instructions that were given at this phone interview. Patient denies shortness of breath, chest pain, fever, cough a this phone interview.

## 2019-08-08 NOTE — Progress Notes (Signed)
GYNECOLOGY  VISIT   HPI: 18 y.o.   Single  African American  female   G1P0000 with Patient's last menstrual period was 06/09/2019 (approximate).   here for positive UPT and having dark blood spotting.   Bleeding started 3 days ago, and it is very light.  She is having diarrhea for 4 days.  Nausea and vomiting.  No fever.  She has some cramping sensation on her left side.  Some shortness of breath for a few days.   No cough, no loss of taste.   No known exposures to Covid.  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   She has had serial hCGs and her levels are decreasing.  [id number] on 08/05/19 and 6673 on 08/07/19. At [facility], she had an US 10/04/18 showing an intrauterine gestational sac 5 +3 week size with no fetal pole and no yolk sac. She had a 2 cm simple left ovarian cyst. She had normal dopplers of the bilateral ovaries.    Her blood type is O positive.  GYNECOLOGIC HISTORY: Patient's last menstrual period was 06/09/2019 (approximate). Contraception:  none Menopausal hormone therapy:  n/a Last mammogram:  n/a Last pap smear:   n/a        OB History    Gravida  1   Para  0   Term  0   Preterm  0   AB  0   Living  0     SAB  0   TAB  0   Ectopic  0   Multiple  0   Live Births  0              Patient Active Problem List   Diagnosis Date Noted  . Rash and nonspecific skin eruption 12/14/2017  . Microscopic hematuria 04/13/2017  . Cervicitis 04/13/2017  . History of chlamydia 04/13/2017  . Menstrual cramps 03/16/2017  . Syncope 10/23/2012    Past Medical History:  Diagnosis Date  . Anxiety   . Chlamydia   . PID (pelvic inflammatory disease)   . Syncope     Past Surgical History:  Procedure Laterality Date  . NO PAST SURGERIES      Current Outpatient Medications  Medication Sig Dispense Refill  . Prenatal Vit-Fe Fumarate-FA (PRENATAL VITAMIN PO) Take by mouth.    . metroNIDAZOLE (FLAGYL) 500 MG tablet Take  1 tablet (500 mg total) by mouth 2 (two) times daily. (Patient not taking: Reported on 08/08/2019) 10 tablet 0   No current facility-administered medications for this visit.      ALLERGIES: Tomato flavor [flavoring agent] and Citrus  Family History  Problem Relation Age of Onset  . Pancreatitis Mother   . Heart disease Mother   . Pancreatic cancer Mother   . Diabetes Father   . Leukemia Father   . Healthy Brother   . Alzheimer's disease Maternal Grandmother     Social History   Socioeconomic History  . Marital status: Single    Spouse name: Not on file  . Number of children: Not on file  . Years of education: Not on file  . Highest education level: Not on file  Occupational History  . Not on file  Social Needs  . Financial resource strain: Not on file  . Food insecurity    Worry: Not on file    Inability: Not on file  . Transportation needs    Medical: Not on file    Non-medical:  Not on file  Tobacco Use  . Smoking status: Former Smoker    Types: Cigarettes  . Smokeless tobacco: Never Used  Substance and Sexual Activity  . Alcohol use: No  . Drug use: No  . Sexual activity: Yes    Partners: Male    Birth control/protection: None  Lifestyle  . Physical activity    Days per week: Not on file    Minutes per session: Not on file  . Stress: Not on file  Relationships  . Social Herbalist on phone: Not on file    Gets together: Not on file    Attends religious service: Not on file    Active member of club or organization: Not on file    Attends meetings of clubs or organizations: Not on file    Relationship status: Not on file  . Intimate partner violence    Fear of current or ex partner: Not on file    Emotionally abused: Not on file    Physically abused: Not on file    Forced sexual activity: Not on file  Other Topics Concern  . Not on file  Social History Narrative  . Not on file    Review of Systems  All other systems reviewed and are  negative.   PHYSICAL EXAMINATION:    BP 122/74   Pulse 80   Temp (!) 97.3 F (36.3 C) (Temporal)   Ht 5' 8.5" (1.74 m)   Wt 122 lb (55.3 kg)   LMP 06/09/2019 (Approximate)   BMI 18.28 kg/m     General appearance: alert, cooperative and appears stated age Head: Normocephalic, without obvious abnormality, atraumatic Lungs: clear to auscultation bilaterally Heart: regular rate and rhythm Abdomen: soft, non-tender, no masses,  no organomegaly Extremities: extremities normal, atraumatic, no cyanosis or edema Skin: Skin color, texture, turgor normal. No rashes or lesions No abnormal inguinal nodes palpated Neurologic: Grossly normal  Pelvic: External genitalia:  no lesions              Urethra:  normal appearing urethra with no masses, tenderness or lesions              Bartholins and Skenes: normal                 Vagina: normal appearing vagina with normal color and discharge, no lesions              Cervix: no lesions.  Small amount of dark blood in the vagina.                 Bimanual Exam:  Uterus:  normal size, contour, position, consistency, mobility, non-tender              Adnexa: no mass, fullness, tenderness               Chaperone was present for exam.  ASSESSMENT  Missed abortion.   Shortness of breath.  Uncertain etiology. Hx chlamydia and PID.  PLAN  We discussed her pregnancy loss with the falling hCGs. We reviewed aneuploidy as the likely cause.  Options for observational management of the SAB, Misoprostol, and Dilation and Evacuation explained . She prefers Dilation and Evacuation.  Risks of surgery may include but are not limited to bleeding, infection, damage to surrounding organs including uterine perforation requiring further surgery, development of Asherman's syndrome.  Will schedule surgery hopefully for tomorrow.  She will return home and quarantine now.  The office will call with  further instructions.    An After Visit Summary was printed and  given to the patient.  __20____ minutes face to face time of which over 50% was spent in counseling.

## 2019-08-08 NOTE — Telephone Encounter (Signed)
Left message to call Sharee Pimple, RN at [facility].   Reviewed with Melvia Heaps, CNM. Patient needs OV today with Dr. Quincy Simmonds. 11:30am time on hold.

## 2019-08-08 NOTE — Telephone Encounter (Signed)
Call to patient. Advised of need to proceed for Covid testing today in preparation for surgery tomorrow.Planning for surgery at 2pm at [facility]. Instructed to go now for testing and call back once completed.   1325- Call from patient. Completed Covid testing. Surgery instructions reviewed with patient. Advised NPO after midnight with exception of clear liquids as directed by hospital. Encouraged to call back if any qquestions or change in status.

## 2019-08-09 ENCOUNTER — Encounter (HOSPITAL_BASED_OUTPATIENT_CLINIC_OR_DEPARTMENT_OTHER): Payer: Self-pay | Admitting: *Deleted

## 2019-08-09 ENCOUNTER — Other Ambulatory Visit: Payer: BC Managed Care – PPO

## 2019-08-09 ENCOUNTER — Ambulatory Visit (HOSPITAL_BASED_OUTPATIENT_CLINIC_OR_DEPARTMENT_OTHER): Payer: BC Managed Care – PPO | Admitting: Anesthesiology

## 2019-08-09 ENCOUNTER — Other Ambulatory Visit: Payer: Self-pay

## 2019-08-09 ENCOUNTER — Encounter (HOSPITAL_BASED_OUTPATIENT_CLINIC_OR_DEPARTMENT_OTHER)
Admission: RE | Disposition: A | Discharge status: Home / Self Care | Payer: Self-pay | Source: Home / Self Care | Attending: Obstetrics and Gynecology

## 2019-08-09 ENCOUNTER — Ambulatory Visit (HOSPITAL_BASED_OUTPATIENT_CLINIC_OR_DEPARTMENT_OTHER)
Admission: RE | Admit: 2019-08-09 | Discharge: 2019-08-09 | Disposition: A | Discharge status: Home / Self Care | Payer: BC Managed Care – PPO | Attending: Obstetrics and Gynecology | Admitting: Obstetrics and Gynecology

## 2019-08-09 DIAGNOSIS — Z87891 Personal history of nicotine dependence: Secondary | ICD-10-CM | POA: Diagnosis not present

## 2019-08-09 DIAGNOSIS — O021 Missed abortion: Secondary | ICD-10-CM | POA: Diagnosis not present

## 2019-08-09 HISTORY — PX: DILATION AND EVACUATION: SHX1459

## 2019-08-09 HISTORY — DX: Dyspnea, unspecified: R06.00

## 2019-08-09 LAB — CBC
HCT: 42 % (ref 36.0–46.0)
Hemoglobin: 13.6 g/dL (ref 12.0–15.0)
MCH: 29.7 pg (ref 26.0–34.0)
MCHC: 32.4 g/dL (ref 30.0–36.0)
MCV: 91.7 fL (ref 80.0–100.0)
Platelets: 293 10*3/uL (ref 150–400)
RBC: 4.58 MIL/uL (ref 3.87–5.11)
RDW: 13.2 % (ref 11.5–15.5)
WBC: 5.6 10*3/uL (ref 4.0–10.5)
nRBC: 0 % (ref 0.0–0.2)

## 2019-08-09 SURGERY — DILATION AND EVACUATION, UTERUS
Anesthesia: General | Site: Vagina

## 2019-08-09 MED ORDER — OXYCODONE HCL 5 MG PO TABS
5.0000 mg | ORAL_TABLET | Freq: Once | ORAL | Status: DC | PRN
  Filled 2019-08-09: qty 1

## 2019-08-09 MED ORDER — LACTATED RINGERS IV SOLN
INTRAVENOUS | Status: DC
  Filled 2019-08-09: qty 1000

## 2019-08-09 MED ORDER — ACETAMINOPHEN 10 MG/ML IV SOLN
1000.0000 mg | Freq: Once | INTRAVENOUS | Status: DC | PRN
  Filled 2019-08-09: qty 100

## 2019-08-09 MED ORDER — DEXAMETHASONE SODIUM PHOSPHATE 10 MG/ML IJ SOLN
INTRAMUSCULAR | Status: AC
  Filled 2019-08-09: qty 1

## 2019-08-09 MED ORDER — LIDOCAINE 2% (20 MG/ML) 5 ML SYRINGE
INTRAMUSCULAR | Status: AC
  Filled 2019-08-09: qty 5

## 2019-08-09 MED ORDER — DOXYCYCLINE HYCLATE 100 MG PO TABS
200.0000 mg | ORAL_TABLET | Freq: Once | ORAL | Status: AC
  Administered 2019-08-09: 13:00:00 200 mg via ORAL
  Filled 2019-08-09: qty 2

## 2019-08-09 MED ORDER — ONDANSETRON HCL 4 MG/2ML IJ SOLN
INTRAMUSCULAR | Status: AC
  Filled 2019-08-09: qty 2

## 2019-08-09 MED ORDER — FENTANYL CITRATE (PF) 100 MCG/2ML IJ SOLN
25.0000 ug | INTRAMUSCULAR | Status: DC | PRN
  Filled 2019-08-09: qty 1

## 2019-08-09 MED ORDER — ACETAMINOPHEN 325 MG PO TABS
ORAL_TABLET | ORAL | Status: DC | PRN
  Administered 2019-08-09: 1000 mg via ORAL

## 2019-08-09 MED ORDER — MIDAZOLAM HCL 2 MG/2ML IJ SOLN
INTRAMUSCULAR | Status: AC
  Filled 2019-08-09: qty 4

## 2019-08-09 MED ORDER — FENTANYL CITRATE (PF) 100 MCG/2ML IJ SOLN
INTRAMUSCULAR | Status: DC | PRN
  Administered 2019-08-09: 50 ug via INTRAVENOUS

## 2019-08-09 MED ORDER — DOXYCYCLINE HYCLATE 100 MG PO TABS
ORAL_TABLET | ORAL | Status: AC
  Filled 2019-08-09: qty 2

## 2019-08-09 MED ORDER — OXYCODONE HCL 5 MG/5ML PO SOLN
5.0000 mg | Freq: Once | ORAL | Status: DC | PRN
  Filled 2019-08-09: qty 5

## 2019-08-09 MED ORDER — DEXAMETHASONE SODIUM PHOSPHATE 10 MG/ML IJ SOLN
INTRAMUSCULAR | Status: DC | PRN
  Administered 2019-08-09: 5 mg via INTRAVENOUS

## 2019-08-09 MED ORDER — FENTANYL CITRATE (PF) 100 MCG/2ML IJ SOLN
INTRAMUSCULAR | Status: AC
  Filled 2019-08-09: qty 2

## 2019-08-09 MED ORDER — IBUPROFEN 800 MG PO TABS: 800.0000 mg | ORAL_TABLET | Freq: Three times a day (TID) | ORAL | 0 refills | Status: DC | PRN

## 2019-08-09 MED ORDER — METRONIDAZOLE IN NACL 5-0.79 MG/ML-% IV SOLN
INTRAVENOUS | Status: AC
  Filled 2019-08-09: qty 100

## 2019-08-09 MED ORDER — MIDAZOLAM HCL 2 MG/2ML IJ SOLN
INTRAMUSCULAR | Status: DC | PRN
  Administered 2019-08-09: 2 mg via INTRAVENOUS

## 2019-08-09 MED ORDER — ARTIFICIAL TEARS OPHTHALMIC OINT
TOPICAL_OINTMENT | OPHTHALMIC | Status: AC
  Filled 2019-08-09: qty 3.5

## 2019-08-09 MED ORDER — METRONIDAZOLE IN NACL 5-0.79 MG/ML-% IV SOLN
500.0000 mg | Freq: Once | INTRAVENOUS | Status: AC
  Administered 2019-08-09: 500 mg via INTRAVENOUS
  Filled 2019-08-09: qty 100

## 2019-08-09 MED ORDER — LIDOCAINE 2% (20 MG/ML) 5 ML SYRINGE
INTRAMUSCULAR | Status: DC | PRN
  Administered 2019-08-09: 80 mg via INTRAVENOUS

## 2019-08-09 MED ORDER — PROPOFOL 10 MG/ML IV BOLUS
INTRAVENOUS | Status: DC | PRN
  Administered 2019-08-09: 40 mg via INTRAVENOUS
  Administered 2019-08-09: 160 mg via INTRAVENOUS

## 2019-08-09 MED ORDER — ACETAMINOPHEN 160 MG/5ML PO SOLN
1000.0000 mg | Freq: Once | ORAL | Status: DC | PRN
  Filled 2019-08-09: qty 40.6

## 2019-08-09 MED ORDER — KETOROLAC TROMETHAMINE 30 MG/ML IJ SOLN
INTRAMUSCULAR | Status: DC | PRN
  Administered 2019-08-09: 30 mg via INTRAVENOUS

## 2019-08-09 MED ORDER — ACETAMINOPHEN 500 MG PO TABS
1000.0000 mg | ORAL_TABLET | Freq: Once | ORAL | Status: DC | PRN
  Filled 2019-08-09: qty 2

## 2019-08-09 MED ORDER — KETOROLAC TROMETHAMINE 30 MG/ML IJ SOLN
INTRAMUSCULAR | Status: AC
  Filled 2019-08-09: qty 1

## 2019-08-09 MED ORDER — LACTATED RINGERS IV SOLN
INTRAVENOUS | Status: DC
  Administered 2019-08-09: 12:00:00 via INTRAVENOUS
  Filled 2019-08-09: qty 1000

## 2019-08-09 MED ORDER — LIDOCAINE HCL 1 % IJ SOLN
INTRAMUSCULAR | Status: DC | PRN
  Administered 2019-08-09: 10 mL

## 2019-08-09 MED ORDER — ACETAMINOPHEN 500 MG PO TABS
ORAL_TABLET | ORAL | Status: AC
  Filled 2019-08-09: qty 2

## 2019-08-09 MED ORDER — ONDANSETRON HCL 4 MG/2ML IJ SOLN
INTRAMUSCULAR | Status: DC | PRN
  Administered 2019-08-09: 4 mg via INTRAVENOUS

## 2019-08-09 SURGICAL SUPPLY — 21 items
CATH ROBINSON RED A/P 16FR (CATHETERS) ×3 IMPLANT
DECANTER SPIKE VIAL GLASS SM (MISCELLANEOUS) ×3 IMPLANT
FILTER UTR ASPR ASSEMBLY (MISCELLANEOUS) ×3 IMPLANT
GLOVE BIO SURGEON STRL SZ 6.5 (GLOVE) ×2 IMPLANT
GLOVE BIO SURGEONS STRL SZ 6.5 (GLOVE) ×1
GLOVE BIOGEL PI IND STRL 7.0 (GLOVE) ×1 IMPLANT
GLOVE BIOGEL PI INDICATOR 7.0 (GLOVE) ×2
GOWN STRL REUS W/ TWL LRG LVL3 (GOWN DISPOSABLE) ×2 IMPLANT
GOWN STRL REUS W/TWL LRG LVL3 (GOWN DISPOSABLE) ×6
HOSE CONNECTING 18IN BERKELEY (TUBING) ×3 IMPLANT
KIT BERKELEY 1ST TRIMESTER 3/8 (MISCELLANEOUS) ×6 IMPLANT
KIT TURNOVER CYSTO (KITS) ×3 IMPLANT
NS IRRIG 1000ML POUR BTL (IV SOLUTION) ×1 IMPLANT
PACK VAGINAL MINOR WOMEN LF (CUSTOM PROCEDURE TRAY) ×3 IMPLANT
PAD OB MATERNITY 4.3X12.25 (PERSONAL CARE ITEMS) ×3 IMPLANT
SET BERKELEY SUCTION TUBING (SUCTIONS) ×3 IMPLANT
TOWEL OR 17X26 10 PK STRL BLUE (TOWEL DISPOSABLE) ×3 IMPLANT
VACURETTE 10 RIGID CVD (CANNULA) IMPLANT
VACURETTE 7MM CVD STRL WRAP (CANNULA) ×2 IMPLANT
VACURETTE 8 RIGID CVD (CANNULA) IMPLANT
VACURETTE 9 RIGID CVD (CANNULA) IMPLANT

## 2019-08-09 NOTE — Progress Notes (Signed)
Patient voided at 1240

## 2019-08-09 NOTE — Anesthesia Procedure Notes (Signed)
Procedure Name: LMA Insertion Date/Time: 08/09/2019 1:53 PM Performed by: Wanita Chamberlain, CRNA Pre-anesthesia Checklist: Patient identified, Timeout performed, Emergency Drugs available, Suction available and Patient being monitored Patient Re-evaluated:Patient Re-evaluated prior to induction Oxygen Delivery Method: Circle system utilized Preoxygenation: Pre-oxygenation with 100% oxygen Induction Type: IV induction Ventilation: Mask ventilation without difficulty LMA: LMA inserted LMA Size: 4.0 Number of attempts: 1 Placement Confirmation: breath sounds checked- equal and bilateral,  CO2 detector and positive ETCO2 Tube secured with: Tape Dental Injury: Teeth and Oropharynx as per pre-operative assessment

## 2019-08-09 NOTE — Op Note (Signed)
OPERATIVE NOTE  PREOPERATIVE DIAGNOSIS:  Missed abortion.   POSTOPERATIVE DIAGNOSIS:  Missed abortion.   PROCEDURE:  Dilation and evacuation.   SURGEON:  Aundria Rud, MD  ANESTHESIA:  LMA, Paracervical block with 10 cc 1% lidocaine.  IVF:  600 cc Lr  EBL:  25 cc.   UO:  25 cc.   COMPLICATIONS:  None.   INDICATIONS FOR THE PROCEDURE:   The patient is a 18 year old Gravida 1, Sydney Woods female, with LMP 06/09/19, who presents with bleeding in early pregnancy.  The patient was seen in the emergency department on 08/05/19, and she had an hCG of 7675 and an ultrasound showing an IUP with a gestational sac measuring 5+3 week.  No fetal pole or yolk sac were seen.  Her follow up hCG measured 6673 on 08/07/19.   She was diagnosed with a missed abortion and a plan was made to proceed with a dilation and evacuation procedure after the risks, benefits, and alternatives were reviewed.   FINDINGS:    Exam under anesthesia reveals a small uterus with no adnexal masses.   A small amount of products of conception were obtained.   PROCEDURE:  The patient was re-identified in the preoperative hold area.  She received Doxycycline 200 mg orally and Flagyl 500 mg IV.  She received Ted hose and PAS stockings for DVT prophylaxis.    In the operating room, she received an LMA anesthetic and was placed in the dorsal lithotomy position.  Her vaginal and lower abdomen were prepped with betadine.  She was I and O catherized of urine.  She was then sterilely draped.    An exam under anesthesia was performed.  See the findings above.   A speculum was placed in the vagina and a tenaculum was placed on the anterior cervical lip.  A paracervical block was administered with 10 cc 1% lidocaine.  The uterus was sounded to about 7 cm.  A number 7 cannula was placed inside the uterine cavity.  Proper pressure was achieved, and the cannula was turned in a clockwise fashion while it was  removed slowly from the uterine cavity.  This was repeated an additional time.  POC tissue was obtained. Gentle sharp curettage was performed.  A small mount of mucous tissue was obtained and went to pathology with the other POC.  The cannula was inserted one final time and proper pressure applied so that remaining blood could be removed.   The instruments were removed from the vaginal after hemostasis was confirmed.   Repeat bimanual exam was normal.   There were no complications to the procedure.  All sponge, instrument, and needle counts were correct.   The patient was escorted to the recovery room in stable and awake condition.   SIGNED:  Aundria Rud, MD

## 2019-08-09 NOTE — H&P (Signed)
Office Visit  08/08/2019 [facility]   Sydney Woods, Forrestine Him, MD Obstetrics and Gynecology  Missed abortion Dx  Follow-up   ; Referred by Pincus Sanes, MD Reason for Visit  Additional Documentation  Vitals:    BP 122/74  Pulse 80  Temp 97.3 F (36.3 C)  (Temporal)  Ht 5' 8.5" (1.74 m)  Wt 55.3 kg  LMP 06/12/2019  BMI 18.28 kg/m  BSA 1.63 m    More Vitals  Flowsheets:    MEWS Score,  NEWS,  Anthropometrics,  Method of Visit    Encounter Info:   Billing Info,  History,  Allergies,  Detailed Report    All Notes   Progress Notes by Patton Salles, MD at 08/08/2019 11:30 AM Author: Patton Salles, MD Author Type: Physician Filed: 08/08/2019 12:50 PM  Note Status: Signed Cosign: Cosign Not Required Encounter Date: 08/08/2019  Editor: Patton Salles, MD (Physician)  Prior Versions: 1. Patton Salles, MD (Physician) at 08/08/2019 12:49 PM - Sign when Signing Visit   2. Alphonsa Overall, CMA (Certified Engineer, site) at 08/08/2019 11:41 AM - Sign when Signing Visit    GYNECOLOGY  VISIT   HPI: 18 y.o.   Single  African American  female   G1P0000 with Patient's last menstrual period was 06/09/2019 (approximate).   here for positive UPT and having dark blood spotting.   Bleeding started 3 days ago, and it is very light.  She is having diarrhea for 4 days.  Nausea and vomiting.  No fever.  She has some cramping sensation on her left side.  Some shortness of breath for a few days.   No cough, no loss of taste.    No known exposures to Covid.  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    She has had serial hCGs and her levels are decreasing.  9892 on 08/05/19 and 6673 on 08/07/19. At the new [facility], she had an [place] 10/04/18 showing an intrauterine gestational sac 5 +3 week size with no fetal pole and no yolk sac. She had a 2 cm simple left ovarian cyst.  She had normal dopplers of the bilateral ovaries.     Her blood type is O positive.   GYNECOLOGIC HISTORY: Patient's last menstrual period was 06/09/2019 (approximate). Contraception:  none Menopausal hormone therapy:  n/a Last mammogram:  n/a Last pap smear:   n/a                OB History     Gravida  1   Para  0   Term  0   Preterm  0   AB  0   Living  0      SAB  0   TAB  0   Ectopic  0   Multiple  0   Live Births  0                     Patient Active Problem List    Diagnosis Date Noted  . Rash and nonspecific skin eruption 12/14/2017  . Microscopic hematuria 04/13/2017  . Cervicitis 04/13/2017  . History of chlamydia 04/13/2017  . Menstrual cramps 03/16/2017  . Syncope 10/23/2012         Past Medical History:  Diagnosis Date  . Anxiety    . Chlamydia    . PID (pelvic inflammatory disease)    .  Syncope            Past Surgical History:  Procedure Laterality Date  . NO PAST SURGERIES               Current Outpatient Medications  Medication Sig Dispense Refill  . Prenatal Vit-Fe Fumarate-FA (PRENATAL VITAMIN PO) Take by mouth.      . metroNIDAZOLE (FLAGYL) 500 MG tablet Take 1 tablet (500 mg total) by mouth 2 (two) times daily. (Patient not taking: Reported on 08/08/2019) 10 tablet 0    No current facility-administered medications for this visit.       ALLERGIES: Tomato flavor [flavoring agent] and Citrus        Family History  Problem Relation Age of Onset  . Pancreatitis Mother    . Heart disease Mother    . Pancreatic cancer Mother    . Diabetes Father    . Leukemia Father    . Healthy Brother    . Alzheimer's disease Maternal Grandmother       Social History         Socioeconomic History  . Marital status: Single      Spouse name: Not on file  . Number of children: Not on file  . Years of education: Not on file  . Highest education level: Not on file  Occupational History  . Not on file  Social Needs  .  Financial resource strain: Not on file  . Food insecurity      Worry: Not on file      Inability: Not on file  . Transportation needs      Medical: Not on file      Non-medical: Not on file  Tobacco Use  . Smoking status: Former Smoker      Types: Cigarettes  . Smokeless tobacco: Never Used  Substance and Sexual Activity  . Alcohol use: No  . Drug use: No  . Sexual activity: Yes      Partners: Male      Birth control/protection: None  Lifestyle  . Physical activity      Days per week: Not on file      Minutes per session: Not on file  . Stress: Not on file  Relationships  . Social Chief Strategy Officer on phone: Not on file      Gets together: Not on file      Attends religious service: Not on file      Active member of club or organization: Not on file      Attends meetings of clubs or organizations: Not on file      Relationship status: Not on file  . Intimate partner violence      Fear of current or ex partner: Not on file      Emotionally abused: Not on file      Physically abused: Not on file      Forced sexual activity: Not on file  Other Topics Concern  . Not on file  Social History Narrative  . Not on file     Review of Systems  All other systems reviewed and are negative.     PHYSICAL EXAMINATION:     BP 122/74   Pulse 80   Temp (!) 97.3 F (36.3 C) (Temporal)   Ht 5' 8.5" (1.74 m)   Wt 122 lb (55.3 kg)   LMP 06/09/2019 (Approximate)   BMI 18.28 kg/m     General appearance: alert, cooperative and  appears stated age Head: Normocephalic, without obvious abnormality, atraumatic Lungs: clear to auscultation bilaterally Heart: regular rate and rhythm Abdomen: soft, non-tender, no masses,  no organomegaly Extremities: extremities normal, atraumatic, no cyanosis or edema Skin: Skin color, texture, turgor normal. No rashes or lesions No abnormal inguinal nodes palpated Neurologic: Grossly normal   Pelvic: External genitalia:  no lesions               Urethra:  normal appearing urethra with no masses, tenderness or lesions              Bartholins and Skenes: normal                 Vagina: normal appearing vagina with normal color and discharge, no lesions              Cervix: no lesions.  Small amount of dark blood in the vagina.                 Bimanual Exam:  Uterus:  normal size, contour, position, consistency, mobility, non-tender              Adnexa: no mass, fullness, tenderness                Chaperone was present for exam.   ASSESSMENT   Missed abortion.   Shortness of breath.  Uncertain etiology. Hx chlamydia and PID.   PLAN   We discussed her pregnancy loss with the falling hCGs. We reviewed aneuploidy as the likely cause.  Options for observational management of the SAB, Misoprostol, and Dilation and Evacuation explained . She prefers Dilation and Evacuation.  Risks of surgery may include but are not limited to bleeding, infection, damage to surrounding organs including uterine perforation requiring further surgery, development of Asherman's syndrome.  Will schedule surgery hopefully for tomorrow.  She will return home and quarantine now.  The office will call with further instructions.    An After Visit Summary was printed and given to the patient.   __25____ minutes face to face time of which over 50% was spent in counseling.

## 2019-08-09 NOTE — Transfer of Care (Signed)
Immediate Anesthesia Transfer of Care Note  Patient: Sydney Woods  Procedure(s) Performed: DILATATION AND EVACUATION (N/A Vagina )  Patient Location: PACU  Anesthesia Type:General  Level of Consciousness: drowsy and patient cooperative  Airway & Oxygen Therapy: Patient Spontanous Breathing and Patient connected to nasal cannula oxygen  Post-op Assessment: Report given to RN and Post -op Vital signs reviewed and stable  Post vital signs: Reviewed and stable  Last Vitals:  Vitals Value Taken Time  BP    Temp    Pulse 107 08/09/19 1437  Resp 21 08/09/19 1437  SpO2 97 % 08/09/19 1437  Vitals shown include unvalidated device data.  Last Pain:  Vitals:   08/09/19 1224  TempSrc: Oral  PainSc: 0-No pain      Patients Stated Pain Goal: 5 (41/96/22 2979)  Complications: No apparent anesthesia complications

## 2019-08-09 NOTE — Progress Notes (Signed)
Update to History and Physical  No marked change in status since office preop visit.  No bleeding overnight. Feeling better overall.  States she did not complete her course of Flagyl for bacterial vaginosis dx in the ER.  She had nausea with the Flagyl.   Patient examined.   Covid 19 negative test.  OK to proceed with surgery.  Will give Flagyl 500 mg IV prophylaxis preop.  Patient also received Doxycycline 200 mg preop.

## 2019-08-09 NOTE — Anesthesia Preprocedure Evaluation (Addendum)
Anesthesia Evaluation  Patient identified by MRN, date of birth, ID band Patient awake    Reviewed: Allergy & Precautions, NPO status , Patient's Chart, lab work & pertinent test results  History of Anesthesia Complications Negative for: history of anesthetic complications  Airway Mallampati: II  TM Distance: >3 FB Neck ROM: Full    Dental  (+) Dental Advisory Given   Pulmonary neg recent URI, former smoker,    breath sounds clear to auscultation       Cardiovascular Exercise Tolerance: Good negative cardio ROS   Rhythm:Regular     Neuro/Psych negative psych ROS   GI/Hepatic negative GI ROS, Neg liver ROS,   Endo/Other    Renal/GU negative Renal ROS  negative genitourinary   Musculoskeletal negative musculoskeletal ROS (+)   Abdominal   Peds  Hematology negative hematology ROS (+)   Anesthesia Other Findings Missed AB  Reproductive/Obstetrics                           Anesthesia Physical Anesthesia Plan  ASA: II  Anesthesia Plan: General   Post-op Pain Management:    Induction: Intravenous  PONV Risk Score and Plan: 3 and Ondansetron and Dexamethasone  Airway Management Planned: LMA and Oral ETT  Additional Equipment: None  Intra-op Plan:   Post-operative Plan: Extubation in OR  Informed Consent: I have reviewed the patients History and Physical, chart, labs and discussed the procedure including the risks, benefits and alternatives for the proposed anesthesia with the patient or authorized representative who has indicated his/her understanding and acceptance.     Dental advisory given  Plan Discussed with: CRNA and Surgeon  Anesthesia Plan Comments:         Anesthesia Quick Evaluation

## 2019-08-09 NOTE — Discharge Instructions (Signed)

## 2019-08-11 LAB — SURGICAL PATHOLOGY

## 2019-08-15 ENCOUNTER — Encounter (HOSPITAL_BASED_OUTPATIENT_CLINIC_OR_DEPARTMENT_OTHER): Payer: Self-pay | Admitting: Obstetrics and Gynecology

## 2019-08-17 NOTE — Anesthesia Postprocedure Evaluation (Signed)
Anesthesia Post Note  Patient: Sydney Woods  Procedure(s) Performed: DILATATION AND EVACUATION (N/A Vagina )     Patient location during evaluation: PACU Anesthesia Type: General Level of consciousness: awake and alert Pain management: pain level controlled Vital Signs Assessment: post-procedure vital signs reviewed and stable Respiratory status: spontaneous breathing, nonlabored ventilation, respiratory function stable and patient connected to nasal cannula oxygen Cardiovascular status: blood pressure returned to baseline and stable Postop Assessment: no apparent nausea or vomiting Anesthetic complications: no    Last Vitals:  Vitals:   08/09/19 1515 08/09/19 1545  BP: 108/68 108/82  Pulse: 73 67  Resp: 18 17  Temp:    SpO2: 98% 100%    Last Pain:  Vitals:   08/15/19 1009  TempSrc:   PainSc: 4                  Debralee Braaksma

## 2019-08-18 ENCOUNTER — Other Ambulatory Visit: Payer: Self-pay

## 2019-08-22 ENCOUNTER — Encounter: Payer: Self-pay | Admitting: Obstetrics and Gynecology

## 2019-08-22 ENCOUNTER — Other Ambulatory Visit: Payer: Self-pay

## 2019-08-22 ENCOUNTER — Ambulatory Visit (INDEPENDENT_AMBULATORY_CARE_PROVIDER_SITE_OTHER): Payer: BC Managed Care – PPO | Admitting: Obstetrics and Gynecology

## 2019-08-22 VITALS — BP 100/58 | HR 66 | Temp 96.4°F | Ht 68.5 in | Wt 132.2 lb

## 2019-08-22 DIAGNOSIS — Z3169 Encounter for other general counseling and advice on procreation: Secondary | ICD-10-CM

## 2019-08-22 DIAGNOSIS — Z9889 Other specified postprocedural states: Secondary | ICD-10-CM

## 2019-08-22 NOTE — Progress Notes (Signed)
GYNECOLOGY  VISIT   HPI: 18 y.o.   Single  African American  female   G1P0000 with Patient's last menstrual period was 08/16/2019 (approximate).here for 2 week follow up [place] (N/A Vagina ).  Pathology report showed chorionic villi.    Bleeding stopped 2 - 3 days ago.   Wants to try again for pregnancy in a couple months.   Denies FH of birth defects.  Denies FH background or Jewish background.   Blood type O positive.   GYNECOLOGIC HISTORY: Patient's last menstrual period was 08/16/2019 (approximate). Contraception: None Menopausal hormone therapy:  n/a Last mammogram:  n/a Last pap smear:   N/a TDap:  2010.        OB History    Gravida  1   Para  0   Term  0   Preterm  0   AB  0   Living  0     SAB  0   TAB  0   Ectopic  0   Multiple  0   Live Births  0              Patient Active Problem List   Diagnosis Date Noted  . Rash and nonspecific skin eruption 12/14/2017  . Microscopic hematuria 04/13/2017  . Cervicitis 04/13/2017  . History of chlamydia 04/13/2017  . Menstrual cramps 03/16/2017  . Syncope 10/23/2012    Past Medical History:  Diagnosis Date  . Anxiety   . Chlamydia   . Dyspnea    SOB WHEN GETS NERVOUS FOR 4 DAYS LAST TIME 08-08-2019  . PID (pelvic inflammatory disease)   . Syncope    NONE RECENT    Past Surgical History:  Procedure Laterality Date  . DILATION AND EVACUATION N/A 08/09/2019   Procedure: DILATATION AND EVACUATION;  Surgeon: Nunzio Cobbs, MD;  Location: [facility];  Service: Gynecology;  Laterality: N/A;  . NO PAST SURGERIES      Current Outpatient Medications  Medication Sig Dispense Refill  . Prenatal Vit-Fe Fumarate-FA (PRENATAL VITAMIN PO) Take by mouth.     No current facility-administered medications for this visit.      ALLERGIES: Tomato flavor [flavoring agent] and Citrus  Family History  Problem Relation Age of Onset  . Pancreatitis Mother    . Heart disease Mother   . Pancreatic cancer Mother   . Diabetes Father   . Leukemia Father   . Healthy Brother   . Alzheimer's disease Maternal Grandmother     Social History   Socioeconomic History  . Marital status: Single    Spouse name: Not on file  . Number of children: Not on file  . Years of education: Not on file  . Highest education level: Not on file  Occupational History  . Not on file  Social Needs  . Financial resource strain: Not on file  . Food insecurity    Worry: Not on file    Inability: Not on file  . Transportation needs    Medical: Not on file    Non-medical: Not on file  Tobacco Use  . Smoking status: Former Smoker    Types: Cigarettes  . Smokeless tobacco: Never Used  . Tobacco comment: QUIT SOICAL SMOKER  Substance and Sexual Activity  . Alcohol use: No  . Drug use: Yes    Types: Marijuana    Comment: MARIJUANA LAST USED 07-25-2019  . Sexual activity: Yes    Partners: Male  Birth control/protection: None  Lifestyle  . Physical activity    Days per week: Not on file    Minutes per session: Not on file  . Stress: Not on file  Relationships  . Social Musician on phone: Not on file    Gets together: Not on file    Attends religious service: Not on file    Active member of club or organization: Not on file    Attends meetings of clubs or organizations: Not on file    Relationship status: Not on file  . Intimate partner violence    Fear of current or ex partner: Not on file    Emotionally abused: Not on file    Physically abused: Not on file    Forced sexual activity: Not on file  Other Topics Concern  . Not on file  Social History Narrative  . Not on file    Review of Systems  All other systems reviewed and are negative.   PHYSICAL EXAMINATION:    BP (!) 100/58   Pulse 66   Temp (!) 96.4 F (35.8 C) (Temporal)   Ht 5' 8.5" (1.74 m)   Wt 132 lb 3.2 oz (60 kg)   LMP 08/16/2019 (Approximate)   Breastfeeding  Unknown   BMI 19.81 kg/m     General appearance: alert, cooperative and appears stated age    Pelvic: External genitalia:  no lesions              Urethra:  normal appearing urethra with no masses, tenderness or lesions              Bartholins and Skenes: normal                 Vagina: normal appearing vagina with normal color and discharge, no lesions              Cervix: no lesions                Bimanual Exam:  Uterus:  normal size, contour, position, consistency, mobility, non-tender              Adnexa: no mass, fullness, tenderness               Chaperone was present for exam.  ASSESSMENT  Status post dilation and evacuation for missed abortion.  Recovering well. Desire for future pregnancy.  PLAN  We discussed her surgical care and final pathology report.  Return to menses reviewed.  She will wait to try for pregnancy again until she has had at least one normal period. PNV recommended.  I recommended avoiding tobacco, ETOH, drugs, and unnecessary medications.  TDap and flu vaccine recommended.  She declines TDap today. Fu for annual exam and pap in 3 months.    An After Visit Summary was printed and given to the patient.

## 2019-11-05 NOTE — Progress Notes (Deleted)
Subjective:    Patient ID: Sydney Woods, female    DOB: June 19, 2001, 19 y.o.   MRN: [id number]  HPI The patient is here for an acute visit.   Can not hear out of right ear  Medications and allergies reviewed with patient and updated if appropriate.  Patient Active Problem List   Diagnosis Date Noted  . Rash and nonspecific skin eruption 12/14/2017  . Microscopic hematuria 04/13/2017  . Cervicitis 04/13/2017  . History of chlamydia 04/13/2017  . Menstrual cramps 03/16/2017  . Syncope 10/23/2012    Current Outpatient Medications on File Prior to Visit  Medication Sig Dispense Refill  . Prenatal Vit-Fe Fumarate-FA (PRENATAL VITAMIN PO) Take by mouth.     No current facility-administered medications on file prior to visit.    Past Medical History:  Diagnosis Date  . Anxiety   . Chlamydia   . Dyspnea    SOB WHEN GETS NERVOUS FOR 4 DAYS LAST TIME 08-08-2019  . PID (pelvic inflammatory disease)   . Syncope    NONE RECENT    Past Surgical History:  Procedure Laterality Date  . DILATION AND EVACUATION N/A 08/09/2019   Procedure: DILATATION AND EVACUATION;  Surgeon: Patton Salles, MD;  [facility]  Service: Gynecology;  Laterality: N/A;  . NO PAST SURGERIES      Social History   Socioeconomic History  . Marital status: Single    Spouse name: Not on file  . Number of children: Not on file  . Years of education: Not on file  . Highest education level: Not on file  Occupational History  . Not on file  Tobacco Use  . Smoking status: Former Smoker    Types: Cigarettes  . Smokeless tobacco: Never Used  . Tobacco comment: QUIT SOICAL SMOKER  Substance and Sexual Activity  . Alcohol use: No  . Drug use: Yes    Types: Marijuana    Comment: MARIJUANA LAST USED 07-25-2019  . Sexual activity: Yes    Partners: Male    Birth control/protection: None  Other Topics Concern  . Not on file  Social History Narrative  . Not on file     Social Determinants of Health   Financial Resource Strain:   . Difficulty of Paying Living Expenses: Not on file  Food Insecurity:   . Worried About Programme researcher, broadcasting/film/video in the Last Year: Not on file  . Ran Out of Food in the Last Year: Not on file  Transportation Needs:   . Lack of Transportation (Medical): Not on file  . Lack of Transportation (Non-Medical): Not on file  Physical Activity:   . Days of Exercise per Week: Not on file  . Minutes of Exercise per Session: Not on file  Stress:   . Feeling of Stress : Not on file  Social Connections:   . Frequency of Communication with Friends and Family: Not on file  . Frequency of Social Gatherings with Friends and Family: Not on file  . Attends Religious Services: Not on file  . Active Member of Clubs or Organizations: Not on file  . Attends Banker Meetings: Not on file  . Marital Status: Not on file    Family History  Problem Relation Age of Onset  . Pancreatitis Mother   . Heart disease Mother   . Pancreatic cancer Mother   . Diabetes Father   . Leukemia Father   . Healthy Brother   . Alzheimer's  disease Maternal Grandmother     Review of Systems     Objective:  There were no vitals filed for this visit. BP Readings from Last 3 Encounters:  08/22/19 (!) 100/58  08/09/19 108/82  08/08/19 122/74   Wt Readings from Last 3 Encounters:  08/22/19 132 lb 3.2 oz (60 kg)  08/09/19 120 lb 3 oz (54.5 kg)  08/08/19 122 lb (55.3 kg)   There is no height or weight on file to calculate BMI.   Physical Exam         Assessment & Plan:    See Problem List for Assessment and Plan of chronic medical problems.

## 2019-11-06 ENCOUNTER — Ambulatory Visit: Payer: BC Managed Care – PPO | Admitting: Internal Medicine

## 2019-11-06 DIAGNOSIS — Z0289 Encounter for other administrative examinations: Secondary | ICD-10-CM

## 2019-11-27 ENCOUNTER — Encounter: Payer: Self-pay | Admitting: Certified Nurse Midwife

## 2019-11-29 ENCOUNTER — Encounter: Payer: Self-pay | Admitting: Certified Nurse Midwife

## 2019-12-04 ENCOUNTER — Other Ambulatory Visit: Payer: Self-pay

## 2019-12-05 ENCOUNTER — Inpatient Hospital Stay (HOSPITAL_COMMUNITY): Payer: BC Managed Care – PPO

## 2019-12-05 ENCOUNTER — Telehealth: Payer: Self-pay | Admitting: Internal Medicine

## 2019-12-05 ENCOUNTER — Inpatient Hospital Stay (HOSPITAL_COMMUNITY)
Admission: AD | Admit: 2019-12-05 | Discharge: 2019-12-05 | Disposition: A | Discharge status: Home / Self Care | Payer: BC Managed Care – PPO | Attending: Obstetrics and Gynecology | Admitting: Obstetrics and Gynecology

## 2019-12-05 ENCOUNTER — Encounter (HOSPITAL_COMMUNITY): Payer: Self-pay | Admitting: Obstetrics and Gynecology

## 2019-12-05 ENCOUNTER — Other Ambulatory Visit: Payer: Self-pay

## 2019-12-05 DIAGNOSIS — O3680X0 Pregnancy with inconclusive fetal viability, not applicable or unspecified: Secondary | ICD-10-CM | POA: Diagnosis not present

## 2019-12-05 DIAGNOSIS — Z3A00 Weeks of gestation of pregnancy not specified: Secondary | ICD-10-CM | POA: Diagnosis not present

## 2019-12-05 DIAGNOSIS — Z3A01 Less than 8 weeks gestation of pregnancy: Secondary | ICD-10-CM | POA: Insufficient documentation

## 2019-12-05 DIAGNOSIS — Z87891 Personal history of nicotine dependence: Secondary | ICD-10-CM | POA: Diagnosis not present

## 2019-12-05 DIAGNOSIS — O26851 Spotting complicating pregnancy, first trimester: Secondary | ICD-10-CM | POA: Diagnosis not present

## 2019-12-05 DIAGNOSIS — Z6790 Unspecified blood type, Rh positive: Secondary | ICD-10-CM

## 2019-12-05 DIAGNOSIS — O209 Hemorrhage in early pregnancy, unspecified: Secondary | ICD-10-CM | POA: Diagnosis not present

## 2019-12-05 DIAGNOSIS — N939 Abnormal uterine and vaginal bleeding, unspecified: Secondary | ICD-10-CM

## 2019-12-05 LAB — COMPREHENSIVE METABOLIC PANEL
ALT: 13 U/L (ref 0–44)
AST: 17 U/L (ref 15–41)
Albumin: 4.4 g/dL (ref 3.5–5.0)
Alkaline Phosphatase: 45 U/L (ref 38–126)
Anion gap: 13 (ref 5–15)
BUN: 8 mg/dL (ref 6–20)
CO2: 21 mmol/L — ABNORMAL LOW (ref 22–32)
Calcium: 9.4 mg/dL (ref 8.9–10.3)
Chloride: 104 mmol/L (ref 98–111)
Creatinine, Ser: 0.88 mg/dL (ref 0.44–1.00)
GFR calc Af Amer: >60 mL/min (ref >60)
GFR calc non Af Amer: >60 mL/min (ref >60)
Glucose, Bld: 90 mg/dL (ref 70–99)
Potassium: 3.5 mmol/L (ref 3.5–5.1)
Sodium: 138 mmol/L (ref 135–145)
Total Bilirubin: 1.2 mg/dL (ref 0.3–1.2)
Total Protein: 7.6 g/dL (ref 6.5–8.1)

## 2019-12-05 LAB — CBC
HCT: 42.6 % (ref 36.0–46.0)
Hemoglobin: 14.4 g/dL (ref 12.0–15.0)
MCH: 31.4 pg (ref 26.0–34.0)
MCHC: 33.8 g/dL (ref 30.0–36.0)
MCV: 92.8 fL (ref 80.0–100.0)
Platelets: 272 10*3/uL (ref 150–400)
RBC: 4.59 MIL/uL (ref 3.87–5.11)
RDW: 11.7 % (ref 11.5–15.5)
WBC: 5.8 10*3/uL (ref 4.0–10.5)
nRBC: 0 % (ref 0.0–0.2)

## 2019-12-05 LAB — HCG, QUANTITATIVE, PREGNANCY: hCG, Beta Chain, Quant, S: 101 m[IU]/mL — ABNORMAL HIGH (ref <5)

## 2019-12-05 LAB — WET PREP, GENITAL
Clue Cells Wet Prep HPF POC: NONE SEEN
Sperm: NONE SEEN
Trich, Wet Prep: NONE SEEN
Yeast Wet Prep HPF POC: NONE SEEN

## 2019-12-05 LAB — POCT PREGNANCY, URINE: Preg Test, Ur: POSITIVE — AB

## 2019-12-05 NOTE — MAU Provider Note (Signed)
History     CSN: [id number]  Arrival date and time: 12/05/19 9485   First Provider Initiated Contact with Patient 12/05/19 1739      Chief Complaint  Patient presents with  . Vaginal Bleeding   [name] is a 19 y.o. G2P0000 at [redacted]w[redacted]d who presents to MAU for vaginal bleeding which began this morning. Pt describes bleeding as "very light" and like spotting that she describes as brown, only when wiping. Pt denies bleeding at this time and reports she has been wearing a pad since this morning.  Passing blood clots? no Blood soaking clothes? no Lightheaded/dizzy? no Significant pelvic pain or cramping? no Passed any tissue? no Hx ectopic pregnancy? no  Current pregnancy problems? Pt has not yet been seen Blood Type? O Positive Allergies? Tomatoe, citrus, NKDA Current medications? PNVs Current PNC & next appt? Sydney Woods, Thursday 12/07/2019  Pt denies vaginal discharge/odor/itching. Pt denies N/V, abdominal pain, constipation, diarrhea, or urinary problems. Pt denies fever, chills, fatigue, sweating or changes in appetite. Pt denies SOB or chest pain. Pt denies dizziness, HA, light-headedness, weakness.  Mother present for entire visit.   OB History    Gravida  2   Para  0   Term  0   Preterm  0   AB  0   Living  0     SAB  0   TAB  0   Ectopic  0   Multiple  0   Live Births  0           Past Medical History:  Diagnosis Date  . Anxiety   . Chlamydia   . Dyspnea    SOB WHEN GETS NERVOUS FOR 4 DAYS LAST TIME 08-08-2019  . PID (pelvic inflammatory disease)   . Syncope    NONE RECENT    Past Surgical History:  Procedure Laterality Date  . DILATION AND EVACUATION N/A 08/09/2019   Procedure: DILATATION AND EVACUATION;  Surgeon: Sydney Salles, MD;  Location: [facility];  Service: Gynecology;  Laterality: N/A;  . NO PAST SURGERIES      Family History  Problem Relation Age of Onset  . Pancreatitis Mother    . Heart disease Mother   . Pancreatic cancer Mother   . Diabetes Father   . Leukemia Father   . Healthy Brother   . Alzheimer's disease Maternal Grandmother     Social History   Tobacco Use  . Smoking status: Former Smoker    Types: Cigarettes  . Smokeless tobacco: Never Used  . Tobacco comment: QUIT SOICAL SMOKER  Substance Use Topics  . Alcohol use: No  . Drug use: Yes    Types: Marijuana    Comment: MARIJUANA LAST USED 07-25-2019    Allergies:  Allergies  Allergen Reactions  . Tomato Flavor [Flavoring Agent] Other (See Comments)    Bumps on tongue  . Citrus Other (See Comments)    Bumps on tongue     No medications prior to admission.    Review of Systems  Constitutional: Negative for chills, diaphoresis, fatigue and fever.  Eyes: Negative for visual disturbance.  Respiratory: Negative for shortness of breath.   Cardiovascular: Negative for chest pain.  Gastrointestinal: Negative for abdominal pain, constipation, diarrhea, nausea and vomiting.  Genitourinary: Positive for vaginal bleeding. Negative for dysuria, flank pain, frequency, pelvic pain, urgency and vaginal discharge.  Neurological: Negative for dizziness, weakness, light-headedness and headaches.   Physical Exam   Blood pressure 117/71,  pulse 79, temperature 97.9 F (36.6 C), temperature source Oral, resp. rate 17, height 5\' 8"  (1.727 m), weight 58.6 kg, last menstrual period 11/09/2019, SpO2 100 %, unknown if currently breastfeeding.  Patient Vitals for the past 24 hrs:  BP Temp Temp src Pulse Resp SpO2 Height Weight  12/05/19 1957 117/71 97.9 F (36.6 C) Oral 79 17 - - -  12/05/19 1649 117/71 99 F (37.2 C) Oral (!) 112 16 100 % 5\' 8"  (1.727 m) 58.6 kg   Physical Exam  Constitutional: She is oriented to person, place, and time. She appears well-developed and well-nourished. No distress.  HENT:  Head: Normocephalic and atraumatic.  Respiratory: Effort normal.  GI: Soft. She exhibits no  distension and no mass. There is no abdominal tenderness. There is no rebound and no guarding.  Neurological: She is alert and oriented to person, place, and time.  Skin: Skin is warm and dry. She is not diaphoretic.  Psychiatric: She has a normal mood and affect. Her behavior is normal. Judgment and thought content normal.   Results for orders placed or performed during the hospital encounter of 12/05/19 (from the past 24 hour(s))  Pregnancy, urine POC     Status: Abnormal   Collection Time: 12/05/19  4:55 PM  Result Value Ref Range   Preg Test, Ur POSITIVE (A) NEGATIVE  CBC     Status: None   Collection Time: 12/05/19  5:52 PM  Result Value Ref Range   WBC 5.8 4.0 - 10.5 K/uL   RBC 4.59 3.87 - 5.11 MIL/uL   Hemoglobin 14.4 12.0 - 15.0 g/dL   HCT 12/07/19 12/07/19 - 50.3 %   MCV 92.8 80.0 - 100.0 fL   MCH 31.4 26.0 - 34.0 pg   MCHC 33.8 30.0 - 36.0 g/dL   RDW 54.6 56.8 - 12.7 %   Platelets 272 150 - 400 K/uL   nRBC 0.0 0.0 - 0.2 %  Comprehensive metabolic panel     Status: Abnormal   Collection Time: 12/05/19  5:52 PM  Result Value Ref Range   Sodium 138 135 - 145 mmol/L   Potassium 3.5 3.5 - 5.1 mmol/L   Chloride 104 98 - 111 mmol/L   CO2 21 (L) 22 - 32 mmol/L   Glucose, Bld 90 70 - 99 mg/dL   BUN 8 6 - 20 mg/dL   Creatinine, Ser 00.1 0.44 - 1.00 mg/dL   Calcium 9.4 8.9 - 12/07/19 mg/dL   Total Protein 7.6 6.5 - 8.1 g/dL   Albumin 4.4 3.5 - 5.0 g/dL   AST 17 15 - 41 U/L   ALT 13 0 - 44 U/L   Alkaline Phosphatase 45 38 - 126 U/L   Total Bilirubin 1.2 0.3 - 1.2 mg/dL   GFR calc non Af Amer >60 >60 mL/min   GFR calc Af Amer >60 >60 mL/min   Anion gap 13 5 - 15  hCG, quantitative, pregnancy     Status: Abnormal   Collection Time: 12/05/19  5:52 PM  Result Value Ref Range   hCG, Beta Chain, Quant, S 101 (H) <5 mIU/mL  Wet prep, genital     Status: Abnormal   Collection Time: 12/05/19  6:23 PM   Specimen: Vaginal  Result Value Ref Range   Yeast Wet Prep HPF POC NONE SEEN NONE SEEN    Trich, Wet Prep NONE SEEN NONE SEEN   Clue Cells Wet Prep HPF POC NONE SEEN NONE SEEN   WBC, Wet Prep HPF POC  MODERATE (A) NONE SEEN   Sperm NONE SEEN    US OB LESS THAN 14 WEEKS WITH OB TRANSVAGINAL  Result Date: 12/05/2019 CLINICAL DATA:  Vaginal spotting with positive beta HCG EXAM: OBSTETRIC <14 WK [place] AND TRANSVAGINAL OB US TECHNIQUE: Both transabdominal and transvaginal ultrasound examinations were performed for complete evaluation of the gestation as well as the maternal uterus, adnexal regions, and pelvic cul-de-sac. Transvaginal technique was performed to assess early pregnancy. COMPARISON:  None. FINDINGS: Intrauterine gestational sac: Not visualized Yolk sac:  Not visualized Embryo:  Not visualized Cardiac Activity: Not visualized Subchorionic hemorrhage:  None visualized. Maternal uterus/adnexae: The endometrium appears thickened with a smooth contour. Cervical os is closed. Right ovary measures 3.2 x 3.0 x 2.5 cm. Left ovary measures 2.2 x 4.6 x 4.0 cm. There is a cyst arising from the left ovary which may represent a follicle measuring 2.2 x 2.2 x 2.2 cm. There is a probable collapsed cyst in the right ovary measuring 2.0 x 1.0 cm. There is moderate fluid in the cul-de-sac. IMPRESSION: 1. There is no evident intrauterine gestation currently. Given positive beta HCG, differential considerations in this circumstance must include intrauterine gestation too early to be seen by either transabdominal transvaginal technique; recent spontaneous abortion; possible ectopic gestation. This finding warrants close clinical and laboratory surveillance. Timing of repeat ultrasound in part will depend on beta HCG values going forward. 2. Moderate fluid in the cul-de-sac, a finding that could indicate recent ovarian cyst rupture. This finding potentially could be seen with ectopic gestation; in this regard, close clinical assessment advised. 3. Probable corpus luteum left ovary. Collapsing cyst right ovary  noted. Electronically Signed   By: Lowella Grip III M.D.   On: 12/05/2019 19:08    MAU Course  Procedures  MDM -r/o ectopic -CBC: WNL -CMP: WNL -[place]: pregnancy of unknown anatomic location, moderate fluid in cul-de-sac -hCG: 101 -ABO: O Positive -WetPrep: WNL -GC/CT collected -consulted with Dr. Nehemiah Settle about fluid in cul-de-sac who looked at the ultrasound images and noted that there is nothing else to do at this time other than repeat hCG in 48hrs if she is not having pain -pt discharged to home in stable condition  Orders Placed This Encounter  Procedures  . Wet prep, genital    Standing Status:   Standing    Number of Occurrences:   1  . US OB LESS THAN 14 WEEKS WITH OB TRANSVAGINAL    Standing Status:   Standing    Number of Occurrences:   1    Order Specific Question:   Symptom/Reason for Exam    Answer:   Vaginal spotting [[id number]]  . CBC    Standing Status:   Standing    Number of Occurrences:   1  . Comprehensive metabolic panel    Standing Status:   Standing    Number of Occurrences:   1  . hCG, quantitative, pregnancy    Standing Status:   Standing    Number of Occurrences:   1  . Pregnancy, urine POC    Standing Status:   Standing    Number of Occurrences:   1  . Discharge patient    Order Specific Question:   Discharge disposition    Answer:   01-Home or Self Care [1]    Order Specific Question:   Discharge patient date    Answer:   12/05/2019    Assessment and Plan   1. Pregnancy of unknown anatomic location   2. Vaginal  spotting   3. Blood type, Rh positive    Allergies as of 12/05/2019      Reactions   Tomato Flavor [flavoring Agent] Other (See Comments)   Bumps on tongue   Citrus Other (See Comments)   Bumps on tongue       Medication List    TAKE these medications   PRENATAL VITAMIN PO Take by mouth.       -discussed ectopic vs. SAB vs. Early miscarriage -patient said she was a [facility] patient, Dr. Rutherford Nail called to  inform that patient needs hCG on Friday 12/08/2019 for pregnancy of unknown location. Dr. Rutherford Nail calls back about 20 minutes later to inform that patient is not a [facility] patient, but sees another practice in the same building called [facility] -called on-call doctor for [facility], no answer, voicemail not set up -message sent to Dr. Valentina Shaggy who has an appointment with patient for pregnancy confirmation on 12/07/2019, to let her know that patient needs f/u hCG on 12/08/2019 -strict ectopic/bleeding precautions -return MAU precautions -patient discharged to home in stable condition  Joni Reining E Artie Takayama 12/05/2019, 8:11 PM

## 2019-12-05 NOTE — MAU Note (Signed)
Pt is here to be evaluated for spotting when she wipes, but no crampig. She has a hx of miscarriage in Nov 2020. Her LMP is approx 2/25.

## 2019-12-05 NOTE — Discharge Instructions (Signed)
First Trimester of Pregnancy The first trimester of pregnancy is from week 1 until the end of week 13 (months 1 through 3). A week after a sperm fertilizes an egg, the egg will implant on the wall of the uterus. This embryo will begin to develop into a baby. Genes from you and your partner will form the baby. The female genes will determine whether the baby will be a boy or a girl. At 6-8 weeks, the eyes and face will be formed, and the heartbeat can be seen on ultrasound. At the end of 12 weeks, all the baby's organs will be formed. Now that you are pregnant, you will want to do everything you can to have a healthy baby. Two of the most important things are to get good prenatal care and to follow your health care provider's instructions. Prenatal care is all the medical care you receive before the baby's birth. This care will help prevent, find, and treat any problems during the pregnancy and childbirth. Body changes during your first trimester Your body goes through many changes during pregnancy. The changes vary from woman to woman.  You may gain or lose a couple of pounds at first.  You may feel sick to your stomach (nauseous) and you may throw up (vomit). If the vomiting is uncontrollable, call your health care provider.  You may tire easily.  You may develop headaches that can be relieved by medicines. All medicines should be approved by your health care provider.  You may urinate more often. Painful urination may mean you have a bladder infection.  You may develop heartburn as a result of your pregnancy.  You may develop constipation because certain hormones are causing the muscles that push stool through your intestines to slow down.  You may develop hemorrhoids or swollen veins (varicose veins).  Your breasts may begin to grow larger and become tender. Your nipples may stick out more, and the tissue that surrounds them (areola) may become darker.  Your gums may bleed and may be  sensitive to brushing and flossing.  Dark spots or blotches (chloasma, mask of pregnancy) may develop on your face. This will likely fade after the baby is born.  Your menstrual periods will stop.  You may have a loss of appetite.  You may develop cravings for certain kinds of food.  You may have changes in your emotions from day to day, such as being excited to be pregnant or being concerned that something may go wrong with the pregnancy and baby.  You may have more vivid and strange dreams.  You may have changes in your hair. These can include thickening of your hair, rapid growth, and changes in texture. Some women also have hair loss during or after pregnancy, or hair that feels dry or thin. Your hair will most likely return to normal after your baby is born. What to expect at prenatal visits During a routine prenatal visit:  You will be weighed to make sure you and the baby are growing normally.  Your blood pressure will be taken.  Your abdomen will be measured to track your baby's growth.  The fetal heartbeat will be listened to between weeks 10 and 14 of your pregnancy.  Test results from any previous visits will be discussed. Your health care provider may ask you:  How you are feeling.  If you are feeling the baby move.  If you have had any abnormal symptoms, such as leaking fluid, bleeding, severe headaches, or abdominal   cramping.  If you are using any tobacco products, including cigarettes, chewing tobacco, and electronic cigarettes.  If you have any questions. Other tests that may be performed during your first trimester include:  Blood tests to find your blood type and to check for the presence of any previous infections. The tests will also be used to check for low iron levels (anemia) and protein on red blood cells (Rh antibodies). Depending on your risk factors, or if you previously had diabetes during pregnancy, you may have tests to check for high blood sugar  that affects pregnant women (gestational diabetes).  Urine tests to check for infections, diabetes, or protein in the urine.  An ultrasound to confirm the proper growth and development of the baby.  Fetal screens for spinal cord problems (spina bifida) and Down syndrome.  HIV (human immunodeficiency virus) testing. Routine prenatal testing includes screening for HIV, unless you choose not to have this test.  You may need other tests to make sure you and the baby are doing well. Follow these instructions at home: Medicines  Follow your health care provider's instructions regarding medicine use. Specific medicines may be either safe or unsafe to take during pregnancy.  Take a prenatal vitamin that contains at least 600 micrograms (mcg) of folic acid.  If you develop constipation, try taking a stool softener if your health care provider approves. Eating and drinking   Eat a balanced diet that includes fresh fruits and vegetables, whole grains, good sources of protein such as meat, eggs, or tofu, and low-fat dairy. Your health care provider will help you determine the amount of weight gain that is right for you.  Avoid raw meat and uncooked cheese. These carry germs that can cause birth defects in the baby.  Eating four or five small meals rather than three large meals a day may help relieve nausea and vomiting. If you start to feel nauseous, eating a few soda crackers can be helpful. Drinking liquids between meals, instead of during meals, also seems to help ease nausea and vomiting.  Limit foods that are high in fat and processed sugars, such as fried and sweet foods.  To prevent constipation: ? Eat foods that are high in fiber, such as fresh fruits and vegetables, whole grains, and beans. ? Drink enough fluid to keep your urine clear or pale yellow. Activity  Exercise only as directed by your health care provider. Most women can continue their usual exercise routine during  pregnancy. Try to exercise for 30 minutes at least 5 days a week. Exercising will help you: ? Control your weight. ? Stay in shape. ? Be prepared for labor and delivery.  Experiencing pain or cramping in the lower abdomen or lower back is a good sign that you should stop exercising. Check with your health care provider before continuing with normal exercises.  Try to avoid standing for long periods of time. Move your legs often if you must stand in one place for a long time.  Avoid heavy lifting.  Wear low-heeled shoes and practice good posture.  You may continue to have sex unless your health care provider tells you not to. Relieving pain and discomfort  Wear a good support bra to relieve breast tenderness.  Take warm sitz baths to soothe any pain or discomfort caused by hemorrhoids. Use hemorrhoid cream if your health care provider approves.  Rest with your legs elevated if you have leg cramps or low back pain.  If you develop varicose veins in   your legs, wear support hose. Elevate your feet for 15 minutes, 3-4 times a day. Limit salt in your diet. Prenatal care  Schedule your prenatal visits by the twelfth week of pregnancy. They are usually scheduled monthly at first, then more often in the last 2 months before delivery.  Write down your questions. Take them to your prenatal visits.  Keep all your prenatal visits as told by your health care provider. This is important. Safety  Wear your seat belt at all times when driving.  Make a list of emergency phone numbers, including numbers for family, friends, the hospital, and police and fire departments. General instructions  Ask your health care provider for a referral to a local prenatal education class. Begin classes no later than the beginning of month 6 of your pregnancy.  Ask for help if you have counseling or nutritional needs during pregnancy. Your health care provider can offer advice or refer you to specialists for help  with various needs.  Do not use hot tubs, steam rooms, or saunas.  Do not douche or use tampons or scented sanitary pads.  Do not cross your legs for long periods of time.  Avoid cat litter boxes and soil used by cats. These carry germs that can cause birth defects in the baby and possibly loss of the fetus by miscarriage or stillbirth.  Avoid all smoking, herbs, alcohol, and medicines not prescribed by your health care provider. Chemicals in these products affect the formation and growth of the baby.  Do not use any products that contain nicotine or tobacco, such as cigarettes and e-cigarettes. If you need help quitting, ask your health care provider. You may receive counseling support and other resources to help you quit.  Schedule a dentist appointment. At home, brush your teeth with a soft toothbrush and be gentle when you floss. Contact a health care provider if:  You have dizziness.  You have mild pelvic cramps, pelvic pressure, or nagging pain in the abdominal area.  You have persistent nausea, vomiting, or diarrhea.  You have a bad smelling vaginal discharge.  You have pain when you urinate.  You notice increased swelling in your face, hands, legs, or ankles.  You are exposed to fifth disease or chickenpox.  You are exposed to [place] measles (rubella) and have never had it. Get help right away if:  You have a fever.  You are leaking fluid from your vagina.  You have spotting or bleeding from your vagina.  You have severe abdominal cramping or pain.  You have rapid weight gain or loss.  You vomit blood or material that looks like coffee grounds.  You develop a severe headache.  You have shortness of breath.  You have any kind of trauma, such as from a fall or a car accident. Summary  The first trimester of pregnancy is from week 1 until the end of week 13 (months 1 through 3).  Your body goes through many changes during pregnancy. The changes vary from  woman to woman.  You will have routine prenatal visits. During those visits, your health care provider will examine you, discuss any test results you may have, and talk with you about how you are feeling. This information is not intended to replace advice given to you by your health care provider. Make sure you discuss any questions you have with your health care provider. Document Revised: 08/13/2017 Document Reviewed: 08/12/2016 Elsevier Patient Education  2020 [employer].     Miscarriage A  miscarriage is the loss of an unborn baby (fetus) before the 20th week of pregnancy. Most miscarriages happen during the first 3 months of pregnancy. Sometimes, a miscarriage can happen before a woman knows that she is pregnant. Having a miscarriage can be an emotional experience. If you have had a miscarriage, talk with your health care provider about any questions you may have about miscarrying, the grieving process, and your plans for future pregnancy. What are the causes? A miscarriage may be caused by:  Problems with the genes or chromosomes of the fetus. These problems make it impossible for the baby to develop normally. They are often the result of random errors that occur early in the development of the baby, and are not passed from parent to child (not inherited).  Infection of the cervix or uterus.  Conditions that affect hormone balance in the body.  Problems with the cervix, such as the cervix opening and thinning before pregnancy is at term (cervical insufficiency).  Problems with the uterus. These may include: ? A uterus with an abnormal shape. ? Fibroids in the uterus. ? Congenital abnormalities. These are problems that were present at birth.  Certain medical conditions.  Smoking, drinking alcohol, or using drugs.  Injury (trauma). In many cases, the cause of a miscarriage is not known. What are the signs or symptoms? Symptoms of this condition include:  Vaginal bleeding  or spotting, with or without cramps or pain.  Pain or cramping in the abdomen or lower back.  Passing fluid, tissue, or blood clots from the vagina. How is this diagnosed? This condition may be diagnosed based on:  A physical exam.  Ultrasound.  Blood tests.  Urine tests. How is this treated? Treatment for a miscarriage is sometimes not necessary if you naturally pass all the tissue that was in your uterus. If necessary, this condition may be treated with:  Dilation and curettage (D&C). This is a procedure in which the cervix is stretched open and the lining of the uterus (endometrium) is scraped. This is done only if tissue from the fetus or placenta remains in the body (incomplete miscarriage).  Medicines, such as: ? Antibiotic medicine, to treat infection. ? Medicine to help the body pass any remaining tissue. ? Medicine to reduce (contract) the size of the uterus. These medicines may be given if you have a lot of bleeding. If you have Rh negative blood and your baby was Rh positive, you will need a shot of a medicine called Rh immunoglobulinto protect your future babies from Rh blood problems. "Rh-negative" and "Rh-positive" refer to whether or not the blood has a specific protein found on the surface of red blood cells (Rh factor). Follow these instructions at home: Medicines   Take over-the-counter and prescription medicines only as told by your health care provider.  If you were prescribed antibiotic medicine, take it as told by your health care provider. Do not stop taking the antibiotic even if you start to feel better.  Do not take NSAIDs, such as aspirin and ibuprofen, unless they are approved by your health care provider. These medicines can cause bleeding. Activity  Rest as directed. Ask your health care provider what activities are safe for you.  Have someone help with home and family responsibilities during this time. General instructions  Keep track of the  number of sanitary pads you use each day and how soaked (saturated) they are. Write down this information.  Monitor the amount of tissue or blood clots that you  pass from your vagina. Save any large amounts of tissue for your health care provider to examine.  Do not use tampons, douche, or have sex until your health care provider approves.  To help you and your partner with the process of grieving, talk with your health care provider or seek counseling.  When you are ready, meet with your health care provider to discuss any important steps you should take for your health. Also, discuss steps you should take to have a healthy pregnancy in the future.  Keep all follow-up visits as told by your health care provider. This is important. Where to find more information  The American Congress of Obstetricians and Gynecologists: www.acog.org  [place] Department of Health and [place], systems of Women's Health: [place] Contact a health care provider if:  You have a fever or chills.  You have a foul smelling vaginal discharge.  You have more bleeding instead of less. Get help right away if:  You have severe cramps or pain in your back or abdomen.  You pass blood clots or tissue from your vagina that is walnut-sized or larger.  You soak more than 1 regular sanitary pad in an hour.  You become light-headed or weak.  You pass out.  You have feelings of sadness that take over your thoughts, or you have thoughts of hurting yourself. Summary  Most miscarriages happen in the first 3 months of pregnancy. Sometimes miscarriage happens before a woman even knows that she is pregnant.  Follow your health care provider's instruction for home care. Keep all follow-up appointments.  To help you and your partner with the process of grieving, talk with your health care provider or seek counseling. This information is not intended to replace advice given to you by your health care  provider. Make sure you discuss any questions you have with your health care provider. Document Revised: 12/23/2018 Document Reviewed: 10/06/2016 Elsevier Patient Education  [place].    Ectopic Pregnancy  An ectopic pregnancy is when the fertilized egg attaches (implants) outside the uterus. Most ectopic pregnancies occur in one of the tubes where eggs travel from the ovary to the uterus (fallopian tubes), but the implanting can occur in other locations. In rare cases, ectopic pregnancies occur on the ovary, intestine, pelvis, abdomen, or cervix. In an ectopic pregnancy, the fertilized egg does not have the ability to develop into a normal, healthy baby. A ruptured ectopic pregnancy is one in which tearing or bursting of a fallopian tube causes internal bleeding. Often, there is intense lower abdominal pain, and vaginal bleeding sometimes occurs. Having an ectopic pregnancy can be life-threatening. If this dangerous condition is not treated, it can lead to blood loss, shock, or even death. What are the causes? The most common cause of this condition is damage to one of the fallopian tubes. A fallopian tube may be narrowed or blocked, and that keeps the fertilized egg from reaching the uterus. What increases the risk? This condition is more likely to develop in women of childbearing age who have different levels of risk. The levels of risk can be divided into three categories. High risk  You have gone through infertility treatment.  You have had an ectopic pregnancy before.  You have had surgery on the fallopian tubes, or another surgical procedure, such as an abortion.  You have had surgery to have the fallopian tubes tied (tubal ligation).  You have problems or diseases of the fallopian tubes.  You have been exposed to  diethylstilbestrol (DES). This medicine was used until 1971, and it had effects on babies whose mothers took the medicine.  You become pregnant while using an  IUD (intrauterine device) for birth control. Moderate risk  You have a history of infertility.  You have had an STI (sexually transmitted infection).  You have a history of pelvic inflammatory disease (PID).  You have scarring from endometriosis.  You have multiple sexual partners.  You smoke. Low risk  You have had pelvic surgery.  You use vaginal douches.  You became sexually active before age 21. What are the signs or symptoms? Common symptoms of this condition include normal pregnancy symptoms, such as missing a period, nausea, tiredness, abdominal pain, breast tenderness, and bleeding. However, ectopic pregnancy will have additional symptoms, such as:  Pain with intercourse.  Irregular vaginal bleeding or spotting.  Cramping or pain on one side or in the lower abdomen.  Fast heartbeat, low blood pressure, and sweating.  Passing out while having a bowel movement. Symptoms of a ruptured ectopic pregnancy and internal bleeding may include:  Sudden, severe pain in the abdomen and pelvis.  Dizziness, weakness, light-headedness, or fainting.  Pain in the shoulder or neck area. How is this diagnosed? This condition is diagnosed by:  A pelvic exam to locate pain or a mass in the abdomen.  A pregnancy test. This blood test checks for the presence as well as the specific level of pregnancy hormone in the bloodstream.  Ultrasound. This is performed if a pregnancy test is positive. In this test, a probe is inserted into the vagina. The probe will detect a fetus, possibly in a location other than the uterus.  Taking a sample of uterus tissue (dilation and curettage, or D&C).  Surgery to perform a visual exam of the inside of the abdomen using a thin, lighted tube that has a tiny camera on the end (laparoscope).  Culdocentesis. This procedure involves inserting a needle at the top of the vagina, behind the uterus. If blood is present in this area, it may indicate that a  fallopian tube is torn. How is this treated? This condition is treated with medicine or surgery. Medicine  An injection of a medicine (methotrexate) may be given to cause the pregnancy tissue to be absorbed. This medicine may save your fallopian tube. It may be given if: ? The diagnosis is made early, with no signs of active bleeding. ? The fallopian tube has not ruptured. ? You are considered to be a good candidate for the medicine. Usually, pregnancy hormone blood levels are checked after methotrexate treatment. This is to be sure that the medicine is effective. It may take 4-6 weeks for the pregnancy to be absorbed. Most pregnancies will be absorbed by 3 weeks. Surgery  A laparoscope may be used to remove the pregnancy tissue.  If severe internal bleeding occurs, a larger cut (incision) may be made in the lower abdomen (laparotomy) to remove the fetus and placenta. This is done to stop the bleeding.  Part or all of the fallopian tube may be removed (salpingectomy) along with the fetus and placenta. The fallopian tube may also be repaired during the surgery.  In very rare circumstances, removal of the uterus (hysterectomy) may be required.  After surgery, pregnancy hormone testing may be done to be sure that there is no pregnancy tissue left. Whether your treatment is medicine or surgery, you may receive a Rho (D) immune globulin shot to prevent problems with any future pregnancy. This shot  may be given if:  You are Rh-negative and the baby's father is Rh-positive.  You are Rh-negative and you do not know the Rh type of the baby's father. Follow these instructions at home:  Rest and limit your activity after the procedure for as long as told by your health care provider.  Until your health care provider says that it is safe: ? Do not lift anything that is heavier than 10 lb (4.5 kg), or the limit that your health care provider tells you. ? Avoid physical exercise and any movement  that requires effort (is strenuous).  To help prevent constipation: ? Eat a healthy diet that includes fruits, vegetables, and whole grains. ? Drink 6-8 glasses of water per day. Get help right away if:  You develop worsening pain that is not relieved by medicine.  You have: ? A fever or chills. ? Vaginal bleeding. ? Redness and swelling at the incision site. ? Nausea and vomiting.  You feel dizzy or weak.  You feel light-headed or you faint. This information is not intended to replace advice given to you by your health care provider. Make sure you discuss any questions you have with your health care provider. Document Revised: 08/13/2017 Document Reviewed: 04/01/2016 Elsevier Patient Education  2020 [employer].                     Safe Medications in Pregnancy    Acne: Benzoyl Peroxide Salicylic Acid  Backache/Headache: Tylenol: 2 regular strength every 4 hours OR              2 Extra strength every 6 hours  Colds/Coughs/Allergies: Benadryl (alcohol free) 25 mg every 6 hours as needed Breath right strips Claritin Cepacol throat lozenges Chloraseptic throat spray Cold-Eeze- up to three times per day Cough drops, alcohol free Flonase (by prescription only) Guaifenesin Mucinex Robitussin DM (plain only, alcohol free) Saline nasal spray/drops Sudafed (pseudoephedrine) & Actifed ** use only after [redacted] weeks gestation and if you do not have high blood pressure Tylenol Vicks Vaporub Zinc lozenges Zyrtec   Constipation: Colace Ducolax suppositories Fleet enema Glycerin suppositories Metamucil Milk of magnesia Miralax Senokot Smooth move tea  Diarrhea: Kaopectate Imodium A-D  *NO pepto Bismol  Hemorrhoids: Anusol Anusol HC Preparation H Tucks  Indigestion: Tums Maalox Mylanta Zantac  Pepcid  Insomnia: Benadryl (alcohol free) 25mg  every 6 hours as needed Tylenol PM Unisom, no Gelcaps  Leg Cramps: Tums MagGel  Nausea/Vomiting:   Bonine Dramamine Emetrol Ginger extract Sea bands Meclizine  Nausea medication to take during pregnancy:  Unisom (doxylamine succinate 25 mg tablets) Take one tablet daily at bedtime. If symptoms are not adequately controlled, the dose can be increased to a maximum recommended dose of two tablets daily (1/2 tablet in the morning, 1/2 tablet mid-afternoon and one at bedtime). Vitamin B6 100mg  tablets. Take one tablet twice a day (up to 200 mg per day).  Skin Rashes: Aveeno products Benadryl cream or 25mg  every 6 hours as needed Calamine Lotion 1% cortisone cream  Yeast infection: Gyne-lotrimin 7 Monistat 7   **If taking multiple medications, please check labels to avoid duplicating the same active ingredients **take medication as directed on the label ** Do not exceed 4000 mg of tylenol in 24 hours **Do not take medications that contain aspirin or ibuprofen

## 2019-12-05 NOTE — Telephone Encounter (Signed)
New message:   Pt states she took a pregnancy and it came back positive. She states she is having some spotting. Please advise.

## 2019-12-06 LAB — GC/CHLAMYDIA PROBE AMP (~~LOC~~) NOT AT ARMC
Chlamydia: NEGATIVE
Comment: NEGATIVE
Comment: NORMAL
Neisseria Gonorrhea: NEGATIVE

## 2019-12-06 NOTE — Telephone Encounter (Signed)
Spoke with pt. She has an appointment with GYN tomorrow.

## 2019-12-07 ENCOUNTER — Encounter: Payer: Self-pay | Admitting: Obstetrics & Gynecology

## 2019-12-07 ENCOUNTER — Other Ambulatory Visit: Payer: Self-pay

## 2019-12-07 ENCOUNTER — Telehealth: Payer: Self-pay | Admitting: *Deleted

## 2019-12-07 ENCOUNTER — Ambulatory Visit (INDEPENDENT_AMBULATORY_CARE_PROVIDER_SITE_OTHER): Payer: BC Managed Care – PPO | Admitting: Obstetrics & Gynecology

## 2019-12-07 VITALS — BP 116/78 | HR 68 | Temp 97.3°F | Resp 10 | Ht 68.0 in | Wt 129.0 lb

## 2019-12-07 DIAGNOSIS — O200 Threatened abortion: Secondary | ICD-10-CM

## 2019-12-07 LAB — BETA HCG QUANT (REF LAB): hCG Quant: 170 m[IU]/mL

## 2019-12-07 NOTE — Progress Notes (Addendum)
GYNECOLOGY  VISIT  CC:   Patient states that she took 7 pregnancy tests that was positive 12/03/19. Per patient, 6 tests were positive (2 being faint) and 1 was negative. A couple of days later, patient states that she noticed spotting in her underwear and when she wiped. [facility] and patient's hcg level was 101 and an ultrasound was done.  HPI: 19 y.o. G2P0000 Single Black or [place] American female here for ER follow up.  LMP 2/25/22021.  EGA today is 4 0/7 weeks with EDC of 08/15/2020.   Bleeding stopped on Tuesday.  Has not had any cramping or pelvic pain.  She did have some looser stools.  She is keeping down liquids.  She is not having any nausea.    Lab work and notes from MAU reviewed and results given to pt.  Neg Gc/Chl discussed as well as negative wet prep.  Ultrasound images reviewed.  Left ovarian cyst measuring 2.2cm and probable right ovarian cyst 2.0 x 1.0cm.  Moderate fluid in the cul se sac noted.  MB 0+.  Last 08/05/2019.    GYNECOLOGIC HISTORY: Patient's last menstrual period was 11/09/2019 (approximate). Contraception: none Menopausal hormone therapy: n/a  Patient Active Problem List   Diagnosis Date Noted  . Rash and nonspecific skin eruption 12/14/2017  . Microscopic hematuria 04/13/2017  . Cervicitis 04/13/2017  . History of chlamydia 04/13/2017  . Menstrual cramps 03/16/2017  . Syncope 10/23/2012    Past Medical History:  Diagnosis Date  . Anxiety   . Chlamydia   . Dyspnea    SOB WHEN GETS NERVOUS FOR 4 DAYS LAST TIME 08-08-2019  . PID (pelvic inflammatory disease)   . Syncope    NONE RECENT    Past Surgical History:  Procedure Laterality Date  . DILATION AND EVACUATION N/A 08/09/2019   Procedure: DILATATION AND EVACUATION;  Surgeon: Patton Salles, MD;  Location: [facility];  Service: Gynecology;  Laterality: N/A;  . NO PAST SURGERIES      MEDS:   Current Outpatient Medications on File Prior to  Visit  Medication Sig Dispense Refill  . Prenatal Vit-Fe Fumarate-FA (PRENATAL VITAMIN PO) Take by mouth.     No current facility-administered medications on file prior to visit.    ALLERGIES: Tomato flavor [flavoring agent] and Citrus  Family History  Problem Relation Age of Onset  . Pancreatitis Mother   . Heart disease Mother   . Pancreatic cancer Mother   . Diabetes Father   . Leukemia Father   . Healthy Brother   . Alzheimer's disease Maternal Grandmother     SH:  Single, non smoker  Review of Systems  Constitutional:       Night sweats  Gastrointestinal: Positive for diarrhea.  All other systems reviewed and are negative.   PHYSICAL EXAMINATION:    BP 116/78 (BP Location: Left Arm, Patient Position: Sitting, Cuff Size: Normal)   Pulse 68   Temp (!) 97.3 F (36.3 C) (Temporal)   Resp 10   Ht 5\' 8"  (1.727 m)   Wt 129 lb (58.5 kg)   LMP 11/09/2019 (Approximate)   BMI 19.61 kg/m     General appearance: alert, cooperative and appears stated age  Assessment: Bleeding in first trimester H/o missed Ab in December H/o chlamydia H/o PID  Plan: Repeat HCG today.  If going up appropriately, will repeat ultrasound in about two weeks.  If not, repeat lab work on Monday.

## 2019-12-07 NOTE — Telephone Encounter (Signed)
Call to patient to review STAT beta hcg results per Dr. Hyacinth Meeker.  Advised HCG was 101 on 12/05/19. This is increased appropriately. Repeat beta hcg on Monday, 12/11/19, results to Dr. Edward Jolly to review. Patient verbalizes understanding and is agreeable.   Lab scheduled for 12/11/19 at 9:45am. Future lab order placed.   ER precautions reviewed. Patient verbalizes understanding and is agreeable.   Routing to provider for final review. Patient is agreeable to disposition. Will close encounter.  Cc: Dr. Edward Jolly

## 2019-12-11 ENCOUNTER — Telehealth: Payer: Self-pay | Admitting: Obstetrics and Gynecology

## 2019-12-11 ENCOUNTER — Other Ambulatory Visit (INDEPENDENT_AMBULATORY_CARE_PROVIDER_SITE_OTHER): Payer: BC Managed Care – PPO

## 2019-12-11 ENCOUNTER — Other Ambulatory Visit: Payer: Self-pay

## 2019-12-11 DIAGNOSIS — O200 Threatened abortion: Secondary | ICD-10-CM

## 2019-12-11 LAB — BETA HCG QUANT (REF LAB): hCG Quant: 265 m[IU]/mL

## 2019-12-11 NOTE — Telephone Encounter (Signed)
Spoke with pt. Pt wanting to know more about lab results with beta Hcg. Pt educated on possible miscarriage vc ectopic pregnancy due to level not increasing as expected. Pt agreeable to PUS on 12/12/19 per Dr Edward Jolly. Pt to discuss more with Dr Edward Jolly at [facility]. Pt verbalized understanding. Pt denies vaginal bleeding or pain at this time.   Routing to Dr Edward Jolly for review and will close encounter.

## 2019-12-11 NOTE — Telephone Encounter (Signed)
See phone encounter dated 12/11/19  Encounter closed.

## 2019-12-11 NOTE — Telephone Encounter (Signed)
Patient has questions regarding her lab results from today.

## 2019-12-11 NOTE — Telephone Encounter (Signed)
Call to patient and mother, present together. Questions answered and plan of care reviewed. See account notes.  Reviewed ectopic precautions to see care in ED if any changes in status.

## 2019-12-11 NOTE — Telephone Encounter (Addendum)
Patient's mother Annice Pih stopped by to talk with a nurse about her daughter. DPR on file from 2019 to talk with mother Annice Pih? , No details given just wants to talk with a nurse today. The patient herself called while her mother was in the office to talk with a nurse about her results?

## 2019-12-11 NOTE — Progress Notes (Signed)
PUS orders placed for PUS per Dr Edward Jolly results on 12/11/2019.

## 2019-12-12 ENCOUNTER — Other Ambulatory Visit: Payer: BC Managed Care – PPO | Admitting: Obstetrics and Gynecology

## 2019-12-12 ENCOUNTER — Ambulatory Visit: Payer: BC Managed Care – PPO | Admitting: Obstetrics and Gynecology

## 2019-12-12 ENCOUNTER — Encounter: Payer: Self-pay | Admitting: Obstetrics and Gynecology

## 2019-12-12 ENCOUNTER — Other Ambulatory Visit: Payer: BC Managed Care – PPO

## 2019-12-12 ENCOUNTER — Telehealth: Payer: Self-pay | Admitting: Obstetrics and Gynecology

## 2019-12-12 ENCOUNTER — Ambulatory Visit (INDEPENDENT_AMBULATORY_CARE_PROVIDER_SITE_OTHER): Payer: BC Managed Care – PPO

## 2019-12-12 ENCOUNTER — Ambulatory Visit (INDEPENDENT_AMBULATORY_CARE_PROVIDER_SITE_OTHER): Payer: BC Managed Care – PPO | Admitting: Obstetrics and Gynecology

## 2019-12-12 ENCOUNTER — Ambulatory Visit (HOSPITAL_COMMUNITY): Admission: RE | Admit: 2019-12-12 | Payer: BC Managed Care – PPO | Source: Ambulatory Visit

## 2019-12-12 VITALS — BP 100/64 | HR 80 | Temp 98.4°F | Ht 68.5 in | Wt 128.4 lb

## 2019-12-12 DIAGNOSIS — Z3687 Encounter for antenatal screening for uncertain dates: Secondary | ICD-10-CM | POA: Diagnosis not present

## 2019-12-12 DIAGNOSIS — O200 Threatened abortion: Secondary | ICD-10-CM

## 2019-12-12 DIAGNOSIS — Z8619 Personal history of other infectious and parasitic diseases: Secondary | ICD-10-CM | POA: Diagnosis not present

## 2019-12-12 DIAGNOSIS — O0281 Inappropriate change in quantitative human chorionic gonadotropin (hCG) in early pregnancy: Secondary | ICD-10-CM | POA: Diagnosis not present

## 2019-12-12 NOTE — Telephone Encounter (Signed)
Spoke with Gigi Gin at [facility] main radiology scheduling [phone]. Patient scheduled for OB PUS today at 11am, arrive at 10:45am. [facility] with full bladder[facility] [address], 2nd floor, [address].  Call report to [facility].   Routing to [employer]

## 2019-12-12 NOTE — Telephone Encounter (Signed)
Spoke with patient. Patient was on her way to the [facility] after 11:30 am. Patient states that she was contacted by [facility] and advised that since she had not arrived yet they would not be able to work her in today as they were working her in at 84 am. Advised patient as their office was working her in and are not able to do so later in the day she will need to come to our office to have this done or can be seen at MAU for [place] and evaluation.  Patient would like to come to our office for [place] and consult. [place] rescheduled for 2:30 pm with 3 pm consult with Dr.Silva. Aware of benefits and is agreeable.  Routing to provider and will close encounter.

## 2019-12-12 NOTE — Telephone Encounter (Signed)
Left voicemail to return call to [facility] at [phone].

## 2019-12-12 NOTE — Progress Notes (Signed)
GYNECOLOGY  VISIT   HPI: 19 y.o.   Single  African American  female   G2P0000 with Patient's last menstrual period was 11/09/2019 (approximate).   here for Obstetrical ultrasound.    Patient's partner is here for the consultation.   Abnormally rising hCG:  12/05/19 - 101   12/07/19 - 170 12/11/19 - 265  She went to the hospital on 12/05/19 due to bleeding in pregnancy.  Her pregnancy test was positive on 12/03/19 at home. She denies a hx of pain, either then or until now.   Her OB US on 12/05/19 showed no IUP and some fluid in the cul de sac.  Also had possible left CL cyst.   This is a desired pregnancy.   GYNECOLOGIC HISTORY: Patient's last menstrual period was 11/09/2019 (approximate). Contraception: None Menopausal hormone therapy:  n/a Last mammogram:  n/a Last pap smear: n/a        OB History    Gravida  2   Para  0   Term  0   Preterm  0   AB  0   Living  0     SAB  0   TAB  0   Ectopic  0   Multiple  0   Live Births  0              Patient Active Problem List   Diagnosis Date Noted  . Rash and nonspecific skin eruption 12/14/2017  . Microscopic hematuria 04/13/2017  . Cervicitis 04/13/2017  . History of chlamydia 04/13/2017  . Menstrual cramps 03/16/2017  . Syncope 10/23/2012    Past Medical History:  Diagnosis Date  . Anxiety   . Chlamydia   . Dyspnea    SOB WHEN GETS NERVOUS FOR 4 DAYS LAST TIME 08-08-2019  . PID (pelvic inflammatory disease)   . Syncope    NONE RECENT    Past Surgical History:  Procedure Laterality Date  . DILATION AND EVACUATION N/A 08/09/2019   Procedure: DILATATION AND EVACUATION;  Surgeon: Nunzio Cobbs, MD;  Location: [facility];  Service: Gynecology;  Laterality: N/A;  . NO PAST SURGERIES      Current Outpatient Medications  Medication Sig Dispense Refill  . Prenatal Vit-Fe Fumarate-FA (PRENATAL VITAMIN PO) Take by mouth.     No current facility-administered medications  for this visit.     ALLERGIES: Tomato flavor [flavoring agent] and Citrus  Family History  Problem Relation Age of Onset  . Pancreatitis Mother   . Heart disease Mother   . Pancreatic cancer Mother   . Diabetes Father   . Leukemia Father   . Healthy Brother   . Alzheimer's disease Maternal Grandmother     Social History   Socioeconomic History  . Marital status: Single    Spouse name: Not on file  . Number of children: Not on file  . Years of education: Not on file  . Highest education level: Not on file  Occupational History  . Not on file  Tobacco Use  . Smoking status: Never Smoker  . Smokeless tobacco: Never Used  Substance and Sexual Activity  . Alcohol use: No  . Drug use: Yes    Types: Marijuana    Comment: MARIJUANA LAST USED 07-25-2019  . Sexual activity: Yes    Partners: Male    Birth control/protection: None  Other Topics Concern  . Not on file  Social History Narrative  . Not on file   Social Determinants  of Health   Financial Resource Strain:   . Difficulty of Paying Living Expenses:   Food Insecurity:   . Worried About Programme researcher, broadcasting/film/video in the Last Year:   . Barista in the Last Year:   Transportation Needs:   . Freight forwarder (Medical):   Marland Kitchen Lack of Transportation (Non-Medical):   Physical Activity:   . Days of Exercise per Week:   . Minutes of Exercise per Session:   Stress:   . Feeling of Stress :   Social Connections:   . Frequency of Communication with Friends and Family:   . Frequency of Social Gatherings with Friends and Family:   . Attends Religious Services:   . Active Member of Clubs or Organizations:   . Attends Banker Meetings:   Marland Kitchen Marital Status:   Intimate Partner Violence:   . Fear of Current or Ex-Partner:   . Emotionally Abused:   Marland Kitchen Physically Abused:   . Sexually Abused:     Review of Systems  All other systems reviewed and are negative.   PHYSICAL EXAMINATION:    BP 100/64    Pulse 80   Temp 98.4 F (36.9 C) (Temporal)   Ht 5' 8.5" (1.74 m)   Wt 128 lb 6.4 oz (58.2 kg)   LMP 11/09/2019 (Approximate)   BMI 19.24 kg/m     General appearance: alert, cooperative and appears stated age    Pelvic: External genitalia:  no lesions              Urethra:  normal appearing urethra with no masses, tenderness or lesions              Bartholins and Skenes: normal                 Vagina: normal appearing vagina with normal color and discharge, no lesions              Cervix: no lesions                Bimanual Exam:  Uterus:  normal size, contour, position, consistency, mobility, non-tender              Adnexa: no mass, fullness, tenderness      Chaperone was present for exam.  Pelvic US - images reviewed with patient.  No IUP.  Small amount of fluid in endometrial canal.  Left CL cyst.  Mild clear fluid in left adnexa. No evidence of ectopic.   ASSESSMENT  Abnormally rising hCG.  Hx chlamydia.  Hx SAB.   PLAN  Will check hCG tomorrow am and run it STAT.  She knows to go to [facility] at [facility] if she develops any bleeding or pain.  Ectopic precautions.  Pelvic rest.  We discussed potential MTX treatment if the next hCG is not rising appropriately.  We reviewed dilation and evacuation as well, but this can cause intrauterine scarring.  If this represents another pregnancy loss, I recommend work up with MFM.    An After Visit Summary was printed and given to the patient.  ___20___ minutes face to face time of which over 50% was spent in counseling.

## 2019-12-12 NOTE — Telephone Encounter (Signed)
Patient is returning call to Hayley. 

## 2019-12-13 ENCOUNTER — Other Ambulatory Visit (INDEPENDENT_AMBULATORY_CARE_PROVIDER_SITE_OTHER): Payer: BC Managed Care – PPO

## 2019-12-13 ENCOUNTER — Other Ambulatory Visit: Payer: Self-pay

## 2019-12-13 ENCOUNTER — Telehealth: Payer: Self-pay | Admitting: Obstetrics and Gynecology

## 2019-12-13 DIAGNOSIS — O0281 Inappropriate change in quantitative human chorionic gonadotropin (hCG) in early pregnancy: Secondary | ICD-10-CM

## 2019-12-13 LAB — BETA HCG QUANT (REF LAB): hCG Quant: 464 m[IU]/mL

## 2019-12-13 NOTE — Telephone Encounter (Signed)
Spoke to pt. Pt given results of HCG. Pt verbalized understanding of going to [facility] on Friday am for repeat Beta HcG. Pt also advised on ectopic precautions and pelvic rest.  Pt is happy and thankful for call.   Routing to Dr Edward Jolly for review. Encounter closed.  Lab results now showing in Epic

## 2019-12-13 NOTE — Telephone Encounter (Signed)
Please contact patient with her hCG result.  Her hCG from this am is 464.  This is an appropriate rise from her last check 2 days ago.   I recommend she go to the [facility] on Friday am for her next hCG.   I cannot explain why her hCG had a less than expected rise the other day and why it is now normal.   Continue with ectopic precautions and pelvic rest.

## 2019-12-15 ENCOUNTER — Other Ambulatory Visit: Payer: Self-pay

## 2019-12-15 ENCOUNTER — Inpatient Hospital Stay (HOSPITAL_COMMUNITY)
Admission: AD | Admit: 2019-12-15 | Discharge: 2019-12-15 | Disposition: A | Discharge status: Home / Self Care | Payer: BC Managed Care – PPO | Source: Ambulatory Visit | Attending: Obstetrics and Gynecology | Admitting: Obstetrics and Gynecology

## 2019-12-15 DIAGNOSIS — O0281 Inappropriate change in quantitative human chorionic gonadotropin (hCG) in early pregnancy: Secondary | ICD-10-CM | POA: Diagnosis not present

## 2019-12-15 LAB — HCG, QUANTITATIVE, PREGNANCY: hCG, Beta Chain, Quant, S: 1560 m[IU]/mL — ABNORMAL HIGH (ref <5)

## 2019-12-15 NOTE — MAU Note (Signed)
OB has been monitoring hormone level, they are closed today-so had her come here to get it checked. No pain or bleeding. Feels normal.

## 2019-12-18 ENCOUNTER — Ambulatory Visit (INDEPENDENT_AMBULATORY_CARE_PROVIDER_SITE_OTHER): Payer: BC Managed Care – PPO | Admitting: Obstetrics and Gynecology

## 2019-12-18 ENCOUNTER — Telehealth: Payer: Self-pay

## 2019-12-18 ENCOUNTER — Encounter: Payer: Self-pay | Admitting: Obstetrics and Gynecology

## 2019-12-18 ENCOUNTER — Telehealth: Payer: Self-pay | Admitting: Obstetrics and Gynecology

## 2019-12-18 ENCOUNTER — Other Ambulatory Visit: Payer: Self-pay

## 2019-12-18 VITALS — BP 102/60 | HR 64 | Temp 97.3°F | Resp 10 | Ht 68.0 in | Wt 126.0 lb

## 2019-12-18 DIAGNOSIS — O200 Threatened abortion: Secondary | ICD-10-CM

## 2019-12-18 LAB — BETA HCG QUANT (REF LAB): hCG Quant: 2394 m[IU]/mL

## 2019-12-18 NOTE — Telephone Encounter (Signed)
Spoke with pt, results given . Per review with Dr Edward Jolly, pt will need  OB PUS as follow up. Pt is agreeable and verbalized understanding. Pt scheduled for OB  PUS on 12/21/2019 at 10 am. Bleeding precautions given. Pt is aware of need to call office or go to ER if bleeding restarts or pt has any pain. Pt verbalized understanding.   Routing to Dr Edward Jolly for review.  Cc: Hayley for precert.  Future orders placed.

## 2019-12-18 NOTE — Telephone Encounter (Signed)
Spoke to pt. Pt reports having a bleeding episode yesterday that she describes as bright red bleeding that 1/2 filled a panty liner in 30 mins time x 1 after a stressful personal situation. Denies cramping at time of bleeding episode, just "heart racing". Pt is pregnant. Pt denies cramping or bleeding today. Pt also had results from beta HCG on Friday and wanting to discuss next steps.  Spoke to Dr Edward Jolly about above information. Dr Edward Jolly requests pt have OV today. Pt scheduled for 12/18/2019 at 2pm.  Spoke back with pt. Pt agreeable to OV at 2pm today. CPS neg.   Routing to Dr Edward Jolly for final review and will close encounter.

## 2019-12-18 NOTE — Telephone Encounter (Signed)
Patient is pregnant and started bleeding yesterday.

## 2019-12-18 NOTE — Telephone Encounter (Signed)
Received stat beta HCG 2,394.   Routing to Dr Edward Jolly for review.

## 2019-12-18 NOTE — Progress Notes (Signed)
GYNECOLOGY  VISIT   HPI: 19 y.o.   Single  African American  female   G2P0000 with Patient's last menstrual period was 11/09/2019 (approximate).   here for bleeding with pregnancy    Had recent emotional stress and then the bleeding occurred yesterday.  She had bleeding for 30 minutes yesterday.  It has stopped.  No pain or cramping.   hCG was 464 on 12/13/19 and then 1560 on 12/15/19. She has been followed due to a history of chlamydia and prior SAB.   BT O positive.   GYNECOLOGIC HISTORY: Patient's last menstrual period was 11/09/2019 (approximate). Contraception:  none Last pap smear:   never        OB History    Gravida  2   Para  0   Term  0   Preterm  0   AB  0   Living  0     SAB  0   TAB  0   Ectopic  0   Multiple  0   Live Births  0              Patient Active Problem List   Diagnosis Date Noted  . Rash and nonspecific skin eruption 12/14/2017  . Microscopic hematuria 04/13/2017  . Cervicitis 04/13/2017  . History of chlamydia 04/13/2017  . Menstrual cramps 03/16/2017  . Syncope 10/23/2012    Past Medical History:  Diagnosis Date  . Anxiety   . Chlamydia   . Dyspnea    SOB WHEN GETS NERVOUS FOR 4 DAYS LAST TIME 08-08-2019  . PID (pelvic inflammatory disease)   . Syncope    NONE RECENT    Past Surgical History:  Procedure Laterality Date  . DILATION AND EVACUATION N/A 08/09/2019   Procedure: DILATATION AND EVACUATION;  Surgeon: Patton Salles, MD;  Location: [facility];  Service: Gynecology;  Laterality: N/A;  . NO PAST SURGERIES      Current Outpatient Medications  Medication Sig Dispense Refill  . Prenatal Vit-Fe Fumarate-FA (PRENATAL VITAMIN PO) Take by mouth.     No current facility-administered medications for this visit.     ALLERGIES: Tomato flavor [flavoring agent] and Citrus  Family History  Problem Relation Age of Onset  . Pancreatitis Mother   . Heart disease Mother   . Pancreatic  cancer Mother   . Diabetes Father   . Leukemia Father   . Healthy Brother   . Alzheimer's disease Maternal Grandmother     Social History   Socioeconomic History  . Marital status: Single    Spouse name: Not on file  . Number of children: Not on file  . Years of education: Not on file  . Highest education level: Not on file  Occupational History  . Not on file  Tobacco Use  . Smoking status: Never Smoker  . Smokeless tobacco: Never Used  Substance and Sexual Activity  . Alcohol use: No  . Drug use: Yes    Types: Marijuana    Comment: MARIJUANA LAST USED 07-25-2019  . Sexual activity: Yes    Partners: Male    Birth control/protection: None  Other Topics Concern  . Not on file  Social History Narrative  . Not on file   Social Determinants of Health   Financial Resource Strain:   . Difficulty of Paying Living Expenses:   Food Insecurity:   . Worried About Programme researcher, broadcasting/film/video in the Last Year:   . [employer] of  Food in the Last Year:   Transportation Needs:   . Film/video editor (Medical):   Marland Kitchen Lack of Transportation (Non-Medical):   Physical Activity:   . Days of Exercise per Week:   . Minutes of Exercise per Session:   Stress:   . Feeling of Stress :   Social Connections:   . Frequency of Communication with Friends and Family:   . Frequency of Social Gatherings with Friends and Family:   . Attends Religious Services:   . Active Member of Clubs or Organizations:   . Attends Archivist Meetings:   Marland Kitchen Marital Status:   Intimate Partner Violence:   . Fear of Current or Ex-Partner:   . Emotionally Abused:   Marland Kitchen Physically Abused:   . Sexually Abused:     Review of Systems  Constitutional: Negative.   HENT: Negative.   Eyes: Negative.   Respiratory: Negative.   Cardiovascular: Negative.   Gastrointestinal: Negative.   Endocrine: Negative.   Genitourinary: Negative.   Musculoskeletal: Negative.   Skin: Negative.   Allergic/Immunologic: Negative.    Neurological: Negative.   Hematological: Negative.   Psychiatric/Behavioral: Negative.     PHYSICAL EXAMINATION:    BP 102/60 (BP Location: Right Arm, Patient Position: Sitting, Cuff Size: Normal)   Pulse 64   Temp (!) 97.3 F (36.3 C) (Temporal)   Resp 10   Ht 5\' 8"  (1.727 m)   Wt 126 lb (57.2 kg)   LMP 11/09/2019 (Approximate)   BMI 19.16 kg/m     General appearance: alert, cooperative and appears stated age  Pelvic: External genitalia:  no lesions              Urethra:  normal appearing urethra with no masses, tenderness or lesions              Bartholins and Skenes: normal                 Vagina: normal appearing vagina with normal color and discharge, no lesions              Cervix: no lesions.  No blood noted.                 Bimanual Exam:  Uterus:  normal size, contour, position, consistency, mobility, non-tender              Adnexa: no mass, fullness, tenderness             Chaperone was present for exam.  ASSESSMENT  Threatened abortion.  Appropriately rising hCGs.  BT - Rh positive.   PLAN  Will check hCG now.  Patient given list of OB providers to establish care.  We discussed reading with What to Expect When Expecting.    An After Visit Summary was printed and given to the patient.  __20____ minutes face to face time of which over 50% was spent in counseling.

## 2019-12-19 ENCOUNTER — Inpatient Hospital Stay (HOSPITAL_COMMUNITY)
Admission: AD | Admit: 2019-12-19 | Discharge: 2019-12-19 | Disposition: A | Discharge status: Home / Self Care | Payer: BC Managed Care – PPO | Attending: Family Medicine | Admitting: Family Medicine

## 2019-12-19 ENCOUNTER — Inpatient Hospital Stay (HOSPITAL_COMMUNITY): Payer: BC Managed Care – PPO

## 2019-12-19 ENCOUNTER — Other Ambulatory Visit: Payer: Self-pay

## 2019-12-19 ENCOUNTER — Encounter (HOSPITAL_COMMUNITY): Payer: Self-pay | Admitting: Family Medicine

## 2019-12-19 DIAGNOSIS — O0281 Inappropriate change in quantitative human chorionic gonadotropin (hCG) in early pregnancy: Secondary | ICD-10-CM

## 2019-12-19 DIAGNOSIS — O200 Threatened abortion: Secondary | ICD-10-CM | POA: Insufficient documentation

## 2019-12-19 DIAGNOSIS — O208 Other hemorrhage in early pregnancy: Secondary | ICD-10-CM | POA: Diagnosis not present

## 2019-12-19 DIAGNOSIS — O209 Hemorrhage in early pregnancy, unspecified: Secondary | ICD-10-CM

## 2019-12-19 DIAGNOSIS — Z3A08 8 weeks gestation of pregnancy: Secondary | ICD-10-CM | POA: Diagnosis not present

## 2019-12-19 DIAGNOSIS — O468X1 Other antepartum hemorrhage, first trimester: Secondary | ICD-10-CM | POA: Diagnosis present

## 2019-12-19 DIAGNOSIS — Z3A01 Less than 8 weeks gestation of pregnancy: Secondary | ICD-10-CM | POA: Diagnosis not present

## 2019-12-19 DIAGNOSIS — O4690 Antepartum hemorrhage, unspecified, unspecified trimester: Secondary | ICD-10-CM | POA: Diagnosis present

## 2019-12-19 DIAGNOSIS — O418X10 Other specified disorders of amniotic fluid and membranes, first trimester, not applicable or unspecified: Secondary | ICD-10-CM | POA: Diagnosis present

## 2019-12-19 LAB — WET PREP, GENITAL
Clue Cells Wet Prep HPF POC: NONE SEEN
Sperm: NONE SEEN
Trich, Wet Prep: NONE SEEN
Yeast Wet Prep HPF POC: NONE SEEN

## 2019-12-19 LAB — CBC
HCT: 40.2 % (ref 36.0–46.0)
Hemoglobin: 13.6 g/dL (ref 12.0–15.0)
MCH: 31.3 pg (ref 26.0–34.0)
MCHC: 33.8 g/dL (ref 30.0–36.0)
MCV: 92.6 fL (ref 80.0–100.0)
Platelets: 266 10*3/uL (ref 150–400)
RBC: 4.34 MIL/uL (ref 3.87–5.11)
RDW: 11.3 % — ABNORMAL LOW (ref 11.5–15.5)
WBC: 5.8 10*3/uL (ref 4.0–10.5)
nRBC: 0 % (ref 0.0–0.2)

## 2019-12-19 NOTE — Telephone Encounter (Signed)
Patient does need OB ultrasound due to bleeding in pregnancy and intermittently inadequate rise of her hCG level.

## 2019-12-19 NOTE — Discharge Instructions (Signed)
Return to MAU:  If you have heavier bleeding that soaks through more that 2 pads per hour for an hour or more  If you bleed so much that you feel like you might pass out or you do pass out  If you have significant abdominal pain that is not improved with Tylenol 1000 mg every 6 hours  If you develop a fever > 100.5   Center for [facility] Prenatal Care Providers          Center for [facility] locations:  Hours may vary. Please call for an appointment  Center for [facility] @ [place]  [address] [place]  [phone]  Center for [facility] @ [facility]   [address]  [phone]  Center For [facility] @ [facility]       [address] [phone]            Center for [facility] @ [place]     [phone]          Center for [facility] @ [facility]   [address] [phone]  Center for [facility] @ [facility]  [address] [phone]     Center for [facility] @ [facility]  [address]   [phone]    Safe Medications in Pregnancy   Acne: Benzoyl Peroxide Salicylic Acid  Backache/Headache: Tylenol: 2 regular strength every 4 hours OR              2 Extra strength every 6 hours  Colds/Coughs/Allergies: Benadryl (alcohol free) 25 mg every 6 hours as needed Breath right strips Claritin Cepacol throat lozenges Chloraseptic throat spray Cold-Eeze- up to three times per day Cough drops, alcohol free Flonase (by prescription only) Guaifenesin Mucinex Robitussin DM (plain only, alcohol free) Saline nasal spray/drops Sudafed (pseudoephedrine) & Actifed ** use only after [redacted] weeks gestation and if you do not have high blood pressure Tylenol Vicks Vaporub Zinc lozenges Zyrtec   Constipation: Colace Ducolax suppositories Fleet enema Glycerin suppositories Metamucil Milk of  magnesia Miralax Senokot Smooth move tea  Diarrhea: Kaopectate Imodium A-D  *NO pepto Bismol  Hemorrhoids: Anusol Anusol HC Preparation H Tucks  Indigestion: Tums Maalox Mylanta Zantac  Pepcid  Insomnia: Benadryl (alcohol free) 25mg  every 6 hours as needed Tylenol PM Unisom, no Gelcaps  Leg Cramps: Tums MagGel  Nausea/Vomiting:  Bonine Dramamine Emetrol Ginger extract Sea bands Meclizine  Nausea medication to take during pregnancy:  Unisom (doxylamine succinate 25 mg tablets) Take one tablet daily at bedtime. If symptoms are not adequately controlled, the dose can be increased to a maximum recommended dose of two tablets daily (1/2 tablet in the morning, 1/2 tablet mid-afternoon and one at bedtime). Vitamin B6 100mg  tablets. Take one tablet twice a day (up to 200 mg per day).  Skin Rashes: Aveeno products Benadryl cream or 25mg  every 6 hours as needed Calamine Lotion 1% cortisone cream  Yeast infection: Gyne-lotrimin 7 Monistat 7   **If taking multiple medications, please check labels to avoid duplicating the same active ingredients **take medication as directed on the label ** Do not exceed 4000 mg of tylenol in 24 hours **Do not take medications that contain aspirin or ibuprofen

## 2019-12-19 NOTE — MAU Provider Note (Addendum)
History     CSN: [id number]  Arrival date and time: 12/19/19 1655   First Provider Initiated Contact with Patient 12/19/19 1801      Chief Complaint  Patient presents with  . Vaginal Bleeding  . Abdominal Pain   HPI  Sydney Woods is a 19 yo G2P0000 at 5 weeks 5 days EGA who is presenting to [facility] for vaginal bleeding that started approximately 30 minutes prior to presenting to MAU. She says that is similar to a period, and has used a pad but has not not quite filled it. She has been having some cramping as well, but it is mild and not as bad as her periods.  She also states that she has been having yellow discharge for a few days  *Prior HCG levels: 3/23: 101, 3/25: 170, 3/29: 265, 3/31: 464, 4/2: 1560, 4/5: 2394  OB History    Gravida  2   Para  0   Term  0   Preterm  0   AB  0   Living  0     SAB  0   TAB  0   Ectopic  0   Multiple  0   Live Births  0           Past Medical History:  Diagnosis Date  . Anxiety   . Chlamydia   . Dyspnea    SOB WHEN GETS NERVOUS FOR 4 DAYS LAST TIME 08-08-2019  . PID (pelvic inflammatory disease)   . Syncope    NONE RECENT    Past Surgical History:  Procedure Laterality Date  . DILATION AND EVACUATION N/A 08/09/2019   Procedure: DILATATION AND EVACUATION;  Surgeon: Nunzio Cobbs, MD;  Location: [facility];  Service: Gynecology;  Laterality: N/A;  . NO PAST SURGERIES      Family History  Problem Relation Age of Onset  . Pancreatitis Mother   . Heart disease Mother   . Pancreatic cancer Mother   . Diabetes Father   . Leukemia Father   . Healthy Brother   . Alzheimer's disease Maternal Grandmother     Social History   Tobacco Use  . Smoking status: Never Smoker  . Smokeless tobacco: Never Used  Substance Use Topics  . Alcohol use: No  . Drug use: Yes    Types: Marijuana    Comment: MARIJUANA LAST USED 07-25-2019    Allergies:  Allergies  Allergen Reactions  . Tomato Flavor  [Flavoring Agent] Other (See Comments)    Bumps on tongue  . Citrus Other (See Comments)    Bumps on tongue     Medications Prior to Admission  Medication Sig Dispense Refill Last Dose  . Prenatal Vit-Fe Fumarate-FA (PRENATAL VITAMIN PO) Take by mouth.       Review of Systems  Constitutional: Negative.   HENT: Negative.   Eyes: Negative.   Respiratory: Negative.   Cardiovascular: Negative.   Gastrointestinal: Negative.   Endocrine: Negative.   Genitourinary: Positive for pelvic pain and vaginal bleeding.  Musculoskeletal: Negative.   Skin: Negative.   Allergic/Immunologic: Negative.   Neurological: Negative.   Hematological: Negative.   Psychiatric/Behavioral: Negative.    Physical Exam   Blood pressure 112/69, pulse 81, temperature 99 F (37.2 C), temperature source Oral, resp. rate 19, height 5\' 8"  (1.727 m), weight 57.7 kg, last menstrual period 11/09/2019, unknown if currently breastfeeding.  Physical Exam  Nursing note and vitals reviewed. Constitutional: She is oriented to person, place, and  time. She appears well-developed and well-nourished.  HENT:  Head: Normocephalic and atraumatic.  Eyes: Pupils are equal, round, and reactive to light. Conjunctivae and EOM are normal.  Cardiovascular: Normal rate and regular rhythm.  Respiratory: Effort normal and breath sounds normal.  GI: Soft. Bowel sounds are normal.  Genitourinary:    Vagina and uterus normal.     Genitourinary Comments: Dark, clotted blood in vaginal vault- scant amount   Musculoskeletal:        General: Normal range of motion.     Cervical back: Normal range of motion and neck supple.  Neurological: She is alert and oriented to person, place, and time. She has normal reflexes.  Skin: Skin is warm and dry.  Psychiatric: She has a normal mood and affect. Her behavior is normal. Judgment and thought content normal.    MAU Course  Procedures  MDM - bHCG, [place], and CBC  Report given to Raelyn Mora, CNM at [facility], DO OB Fellow, Faculty [facility] 12/19/2019 7:32 PM  Results for orders placed or performed during the hospital encounter of 12/19/19 (from the past 24 hour(s))  CBC     Status: Abnormal   Collection Time: 12/19/19  6:14 PM  Result Value Ref Range   WBC 5.8 4.0 - 10.5 K/uL   RBC 4.34 3.87 - 5.11 MIL/uL   Hemoglobin 13.6 12.0 - 15.0 g/dL   HCT 16.1 09.6 - 04.5 %   MCV 92.6 80.0 - 100.0 fL   MCH 31.3 26.0 - 34.0 pg   MCHC 33.8 30.0 - 36.0 g/dL   RDW 40.9 (L) 81.1 - 91.4 %   Platelets 266 150 - 400 K/uL   nRBC 0.0 0.0 - 0.2 %  Wet prep, genital     Status: Abnormal   Collection Time: 12/19/19  7:32 PM  Result Value Ref Range   Yeast Wet Prep HPF POC NONE SEEN NONE SEEN   Trich, Wet Prep NONE SEEN NONE SEEN   Clue Cells Wet Prep HPF POC NONE SEEN NONE SEEN   WBC, Wet Prep HPF POC FEW (A) NONE SEEN   Sperm NONE SEEN     Assessment and Plan  Vaginal bleeding affecting early pregnancy   - Information provided on vaginal bleeding in early pregnancy - Return to MAU:  If you have heavier bleeding that soaks through more that 2 pads per hour for an hour or more  If you bleed so much that you feel like you might pass out or you do pass out  If you have significant abdominal pain that is not improved with Tylenol   If you develop a fever > 100.5   Subchorionic hematoma in first trimester, single or unspecified fetus  - Discussed [facility] - Information provided on [facility] - Advised [facility] can increase the risk of miscarriage   Threatened miscarriage in early pregnancy  - Information provided on threatened miscarriage - Recommend F/U ultrasound in 2 wks    - Discharge patient - Call Dr. Rica Records office tomorrow to see when she wants you to follow-up for ultrasound -- scheduled for F/U U/S on 12/21/2019. - Patient and spouse verbalized an understanding of the plan of care and agrees.     Raelyn Mora, MSN, CNM 12/19/2019, 8:08 PM

## 2019-12-19 NOTE — MAU Note (Signed)
Pt is a G2P0 at 5.5 week reporting bleeding in the past week that is heavier today with cramping.  Pt reports bleeding is not like a period and needs to wear a pad.  No other OB or medical concerns reported.

## 2019-12-19 NOTE — MAU Note (Signed)
Just started bleeding and cramping really bad about ago.

## 2019-12-20 ENCOUNTER — Telehealth: Payer: Self-pay | Admitting: Obstetrics and Gynecology

## 2019-12-20 LAB — BETA HCG QUANT (REF LAB): hCG Quant: 2785 m[IU]/mL

## 2019-12-20 NOTE — Telephone Encounter (Signed)
Spoke with patient, advised as seen below per Dr. Edward Jolly.  Patient agreeable to plan. Provided patient with contact information for  [facility] [employer] [facility] [address], Suite [place],  [place]  [address], [phone].  Patient will call today to schedule appt to establish care and f/u.  PUS cancelled for 12/21/19 at [facility].  Instructed patient to return call to office if any additional assistance needed with scheduling. Patient verbalizes understanding and is agreeable.   Routing to provider for final review. Patient is agreeable to disposition. Will close encounter.

## 2019-12-20 NOTE — Telephone Encounter (Signed)
Patient was seen at [facility] last night for bleeding and ultrasound was done there.

## 2019-12-20 NOTE — Telephone Encounter (Signed)
I have reviewed the patient's ultrasound, and I recommend she establish care with an OB team.  I do not recommend she return here for further care.  She needs to be seen by an OB team now.   I would recommend she continue with the provider team at the new [facility].

## 2019-12-20 NOTE — Telephone Encounter (Signed)
Spoke with patient. Patient was seen at [facility] MAU for vaginal bleeding on 12/19/19, PUS completed. Patient reports scant amount of dark blood in panties. Denies pain, fever/chills, N/V.   Patient is scheduled for PUS at [facility] on 12/21/19.   Advised patient I will provide update to Dr. Edward Jolly and return call to review plan of care. Patient verbalizes understanding and is agreeable.   Dr. Edward Jolly -please advise on f/u.

## 2019-12-21 ENCOUNTER — Other Ambulatory Visit: Payer: BC Managed Care – PPO | Admitting: Obstetrics and Gynecology

## 2019-12-21 ENCOUNTER — Other Ambulatory Visit: Payer: BC Managed Care – PPO

## 2019-12-24 ENCOUNTER — Inpatient Hospital Stay (HOSPITAL_COMMUNITY)
Admission: AD | Admit: 2019-12-24 | Discharge: 2019-12-24 | Disposition: A | Discharge status: Home / Self Care | Payer: BC Managed Care – PPO | Attending: Obstetrics & Gynecology | Admitting: Obstetrics & Gynecology

## 2019-12-24 ENCOUNTER — Encounter (HOSPITAL_COMMUNITY): Payer: Self-pay | Admitting: Obstetrics & Gynecology

## 2019-12-24 ENCOUNTER — Other Ambulatory Visit: Payer: Self-pay

## 2019-12-24 DIAGNOSIS — O200 Threatened abortion: Secondary | ICD-10-CM | POA: Diagnosis not present

## 2019-12-24 DIAGNOSIS — O418X10 Other specified disorders of amniotic fluid and membranes, first trimester, not applicable or unspecified: Secondary | ICD-10-CM | POA: Diagnosis not present

## 2019-12-24 DIAGNOSIS — O208 Other hemorrhage in early pregnancy: Secondary | ICD-10-CM | POA: Diagnosis not present

## 2019-12-24 DIAGNOSIS — Z3A01 Less than 8 weeks gestation of pregnancy: Secondary | ICD-10-CM | POA: Insufficient documentation

## 2019-12-24 DIAGNOSIS — O468X1 Other antepartum hemorrhage, first trimester: Secondary | ICD-10-CM | POA: Diagnosis not present

## 2019-12-24 DIAGNOSIS — N939 Abnormal uterine and vaginal bleeding, unspecified: Secondary | ICD-10-CM | POA: Diagnosis not present

## 2019-12-24 DIAGNOSIS — O209 Hemorrhage in early pregnancy, unspecified: Secondary | ICD-10-CM

## 2019-12-24 LAB — HCG, QUANTITATIVE, PREGNANCY: hCG, Beta Chain, Quant, S: 8942 m[IU]/mL — ABNORMAL HIGH (ref <5)

## 2019-12-24 NOTE — Discharge Instructions (Signed)
Prenatal Care Providers           [facility]   Phone: [phone]  [facility]   Phone: [phone]  [facility]  [facility]       Phone: [phone]            [facility]     Phone: [phone]          [facility] @ [facility]   Phone: [phone]  [facility]  Phone: [phone]  [facility] Phone: [phone]     [facility]  Phone: [phone]  [facility]  Phone: [phone]  [phone] OB/GYN Phone: [phone]  [facility] Phone: [phone]  [facility] Phone: [phone]  [facility] Phone: [phone]  [facility]  Phone: [phone]

## 2019-12-24 NOTE — MAU Note (Signed)
Sydney Woods is a 19 y.o. at [redacted]w[redacted]d here in MAU reporting: states she has not stopped bleeding since previous MAU visit. Bleeding has been brown until about 15-20 minutes ago bleeding was more red. No pain. No recent IC.  Onset of complaint: ongoing but bleeding is more red today  Pain score: 0/10  Vitals:   12/24/19 1635  BP: (!) 102/57  Pulse: 80  Resp: 16  Temp: 98.8 F (37.1 C)  SpO2: 100%     Lab orders placed from triage: UA

## 2019-12-24 NOTE — MAU Provider Note (Signed)
First Provider Initiated Contact with Patient 12/24/19 1643      S [name] is a 19 y.o. G2P0010 female at [redacted]w[redacted]d with twins who presents to MAU today with complaint of vaginal bleeding. States she's been bleeding since last week when she was seen in MAU. Diagnosed with moderate subchorionic hemorrhage. Also told there were 2 gestational sacs (both with yolk sacs per review). Was told by her gyn that she needs to start prenatal care so she is currently between practices. States the bleeding has slowed down & is now minimal. No abdominal pain, abdomen just feels strange at times. Doesn't want to be evaluated for the bleeding because it has improved, is here because she wants her pregnancy hormone checked & some kind of follow up since her insurance is pending & she doesn't have an ob/gyn yet.    O BP (!) 102/57 (BP Location: Right Arm)   Pulse 80   Temp 98.8 F (37.1 C) (Oral)   Resp 16   Ht 5\' 8"  (1.727 m)   Wt 58.6 kg   LMP 11/09/2019 (Approximate)   SpO2 100% Comment: room air  BMI 19.64 kg/m  Physical Exam  Nursing note and vitals reviewed. Constitutional: She appears well-developed and well-nourished. No distress.  HENT:  Head: Normocephalic and atraumatic.  Respiratory: Effort normal. No respiratory distress.  Skin: She is not diaphoretic.  Psychiatric: She has a normal mood and affect. Her behavior is normal. Judgment and thought content normal.   MDM RH positive Previous ultrasound & records reviewed. Discussed with patient that HCG today would not give 11/11/2019 much information regarding her pregnancy but that we could do an outpatient ultrasound to confirm progression of the pregnancy. Patient would like outpatient ultrasound but states she would also like HCG check today. Pt aware that HCG today will not change any plans we have for her & I will still order an outpatient ultrasound. Patient appreciative. Also given list of ob/gyn providers & encouraged to call around.     A 1. Subchorionic hematoma in first trimester, single or unspecified fetus   2. Threatened miscarriage in early pregnancy   3. Vaginal bleeding affecting early pregnancy      P Discharge from MAU in stable condition Outpatient ultrasound in 1 week ordered for viability Discussed bleeding precautions & reasons to return to MAU OB provider list given  [place], NP 12/24/2019 6:30 PM

## 2020-01-14 ENCOUNTER — Other Ambulatory Visit: Payer: Self-pay

## 2020-01-14 ENCOUNTER — Inpatient Hospital Stay (HOSPITAL_COMMUNITY): Payer: BC Managed Care – PPO

## 2020-01-14 ENCOUNTER — Encounter (HOSPITAL_COMMUNITY): Payer: Self-pay | Admitting: Obstetrics & Gynecology

## 2020-01-14 ENCOUNTER — Inpatient Hospital Stay (HOSPITAL_COMMUNITY)
Admission: AD | Admit: 2020-01-14 | Discharge: 2020-01-14 | Disposition: A | Discharge status: Home / Self Care | Payer: BC Managed Care – PPO | Attending: Obstetrics & Gynecology | Admitting: Obstetrics & Gynecology

## 2020-01-14 DIAGNOSIS — O200 Threatened abortion: Secondary | ICD-10-CM

## 2020-01-14 DIAGNOSIS — O468X1 Other antepartum hemorrhage, first trimester: Secondary | ICD-10-CM

## 2020-01-14 DIAGNOSIS — Z833 Family history of diabetes mellitus: Secondary | ICD-10-CM | POA: Diagnosis not present

## 2020-01-14 DIAGNOSIS — O208 Other hemorrhage in early pregnancy: Secondary | ICD-10-CM | POA: Diagnosis not present

## 2020-01-14 DIAGNOSIS — Z8249 Family history of ischemic heart disease and other diseases of the circulatory system: Secondary | ICD-10-CM | POA: Diagnosis not present

## 2020-01-14 DIAGNOSIS — Z3A01 Less than 8 weeks gestation of pregnancy: Secondary | ICD-10-CM

## 2020-01-14 DIAGNOSIS — O4690 Antepartum hemorrhage, unspecified, unspecified trimester: Secondary | ICD-10-CM

## 2020-01-14 DIAGNOSIS — Z800 Family history of malignant neoplasm of digestive organs: Secondary | ICD-10-CM | POA: Diagnosis not present

## 2020-01-14 DIAGNOSIS — O418X10 Other specified disorders of amniotic fluid and membranes, first trimester, not applicable or unspecified: Secondary | ICD-10-CM

## 2020-01-14 DIAGNOSIS — N939 Abnormal uterine and vaginal bleeding, unspecified: Secondary | ICD-10-CM | POA: Diagnosis not present

## 2020-01-14 LAB — URINALYSIS, ROUTINE W REFLEX MICROSCOPIC
Bilirubin Urine: NEGATIVE
Glucose, UA: NEGATIVE mg/dL
Ketones, ur: NEGATIVE mg/dL
Leukocytes,Ua: NEGATIVE
Nitrite: NEGATIVE
Protein, ur: NEGATIVE mg/dL
Specific Gravity, Urine: 1.014 (ref 1.005–1.030)
pH: 6 (ref 5.0–8.0)

## 2020-01-14 NOTE — MAU Note (Signed)
Pt is a G2P0 at 9.3 weeks with no established care.  Pt has been in MAU in the past for bleeding.  US showed possible blood clot vs. TIUP and subchorionic hemorrhage.  Pt reports bleeding every day for a month, small amount.  Vomiting started 2 days ago and reported a dime size clot in the toilet after going to the bathroom.    Today pt reports slight cramping.  Pt states today that she "just wants to make sure everything is okay" and wants to know if she does have twins.

## 2020-01-14 NOTE — MAU Provider Note (Signed)
History     CSN: [id number]  Arrival date and time: 01/14/20 1137   First Provider Initiated Contact with Patient 01/14/20 1441      Chief Complaint  Patient presents with  . Vaginal Bleeding   HPI   [name] is 19 y.o. female G2P0010 @ [redacted]w[redacted]d here in MAU with complaints of vaginal bleeding. States the bleeding is very light, however she notices is every single day. The bleeding is dark red in color. She was told this is a questionable twin pregnancy.  She has a history of SAB first trimester with D & C. She was first seen in MAU on 3/23 for vaginal bleeding. Patient has had multiple visits to the office and MAU for quants that have abnormally risen. She has no pain. States the pain has not changed since March. She is concerned that she has been bleeding this long and wants to make sure everything is ok.   OB History    Gravida  2   Para  0   Term  0   Preterm  0   AB  1   Living  0     SAB  1   TAB  0   Ectopic  0   Multiple  0   Live Births  0           Past Medical History:  Diagnosis Date  . Anxiety   . Chlamydia   . Dyspnea    SOB WHEN GETS NERVOUS FOR 4 DAYS LAST TIME 08-08-2019  . PID (pelvic inflammatory disease)   . Syncope    NONE RECENT    Past Surgical History:  Procedure Laterality Date  . DILATION AND EVACUATION N/A 08/09/2019   Procedure: DILATATION AND EVACUATION;  Surgeon: Patton Salles, MD;  Location: [facility];  Service: Gynecology;  Laterality: N/A;    Family History  Problem Relation Age of Onset  . Pancreatitis Mother   . Heart disease Mother   . Pancreatic cancer Mother   . Diabetes Father   . Leukemia Father   . Healthy Brother   . Alzheimer's disease Maternal Grandmother     Social History   Tobacco Use  . Smoking status: Never Smoker  . Smokeless tobacco: Never Used  Substance Use Topics  . Alcohol use: No  . Drug use: Yes    Types: Marijuana    Comment: MARIJUANA LAST USED  07-25-2019    Allergies:  Allergies  Allergen Reactions  . Tomato Flavor [Flavoring Agent] Other (See Comments)    Bumps on tongue  . Citrus Other (See Comments)    Bumps on tongue     Medications Prior to Admission  Medication Sig Dispense Refill Last Dose  . Prenatal Vit-Fe Fumarate-FA (PRENATAL VITAMIN PO) Take by mouth.      Results for orders placed or performed during the hospital encounter of 01/14/20 (from the past 48 hour(s))  Urinalysis, Routine w reflex microscopic     Status: Abnormal   Collection Time: 01/14/20  1:04 PM  Result Value Ref Range   Color, Urine YELLOW YELLOW   APPearance CLEAR CLEAR   Specific Gravity, Urine 1.014 1.005 - 1.030   pH 6.0 5.0 - 8.0   Glucose, UA NEGATIVE NEGATIVE mg/dL   Hgb urine dipstick MODERATE (A) NEGATIVE   Bilirubin Urine NEGATIVE NEGATIVE   Ketones, ur NEGATIVE NEGATIVE mg/dL   Protein, ur NEGATIVE NEGATIVE mg/dL   Nitrite NEGATIVE NEGATIVE   Leukocytes,Ua NEGATIVE  NEGATIVE   RBC / HPF 0-5 0 - 5 RBC/hpf   WBC, UA 0-5 0 - 5 WBC/hpf   Bacteria, UA RARE (A) NONE SEEN   Squamous Epithelial / LPF 0-5 0 - 5   Mucus PRESENT     Comment: Performed at [facility], [place] [address] [place], [place] [address]    US OB LESS THAN 14 WEEKS WITH [place] TRANSVAGINAL  Addendum Date: 01/14/2020   ADDENDUM REPORT: 01/14/2020 15:58 ADDENDUM: Comparison to the prior study of 12/19/2019 was omitted from the initial dictation. On 12/19/2019, mean sac diameter was 5.5 mm corresponding to 5 weeks 2 days EGA. On 01/14/2020, mean sac diameter is 15.2 mm, corresponding to 6 weeks 2 days. This represents in inappropriate/inadequate increase in gestational sac size over 26 days. Findings are consistent with a failed pregnancy. Electronically Signed   By: Lavonia Dana M.D.   On: 01/14/2020 15:58   Result Date: 01/14/2020 CLINICAL DATA:  Vaginal bleeding in first trimester of pregnancy; LMP 11/09/2019 EXAM: OBSTETRIC <14 WK [place] AND TRANSVAGINAL OB US  TECHNIQUE: Both transabdominal and transvaginal ultrasound examinations were performed for complete evaluation of the gestation as well as the maternal uterus, adnexal regions, and pelvic cul-de-sac. Transvaginal technique was performed to assess early pregnancy. COMPARISON:  12/19/2019 FINDINGS: Intrauterine gestational sac: Present, single, minimally irregular Yolk sac:  Present Embryo:  Present Cardiac Activity: Not identified Heart Rate: N/A  bpm CRL:  2.8 mm   5 w   5 d                  [place] EDC: 09/10/2020 Subchorionic hemorrhage:  Small subchronic hemorrhage Maternal uterus/adnexae: RIGHT ovary normal size and morphology 4.3 x 1.7 x 2.2 cm. LEFT ovary measures 3.8 x 1.3 x 1.8 cm and contains a small cyst 2.1 cm diameter. No free pelvic fluid or adnexal masses. IMPRESSION: Single intrauterine gestation with crown-rump length corresponding to 5 weeks 5 days EGA. No fetal cardiac activity seen. Small subchronic hemorrhage. Findings are suspicious but not yet definitive for failed pregnancy. Recommend follow-up US in 10-14 days for definitive diagnosis. This recommendation follows SRU consensus guidelines: Diagnostic Criteria for Nonviable Pregnancy Early in the First Trimester. [employer] Med 2013; 425:9563-87. Electronically Signed: By: Lavonia Dana M.D. On: 01/14/2020 15:03   Review of Systems  Gastrointestinal: Negative for abdominal pain.  Genitourinary: Positive for vaginal bleeding.  Neurological: Negative for dizziness.   Physical Exam   Blood pressure 110/65, pulse 87, temperature 98.5 F (36.9 C), temperature source Oral, resp. rate 17, height 5\' 8"  (1.727 m), weight 59.9 kg, last menstrual period 11/09/2019, SpO2 100 %, unknown if currently breastfeeding.  Physical Exam  Constitutional: She is oriented to person, place, and time. She appears well-developed and well-nourished. No distress.  HENT:  Head: Normocephalic.  GI: Soft. She exhibits no distension. There is no abdominal tenderness.  There is no rebound.  Neurological: She is alert and oriented to person, place, and time.  Skin: Skin is warm. She is not diaphoretic.  Psychiatric: Her behavior is normal.    MAU Course  Procedures  MDM  O positive blood type Reviewed [place] and previous US: no growth in gestational sac since April. Discussed [place] in detail with patient and partner. She was counseled on options for management--observation, medical treatment with Cytotec,  or D&E.  She elected to proceed with D&E. She wishes to f/u with Dr. Quincy Simmonds tomorrow. Notified Dr. Dellis Filbert who is on call for practice that  patient was seen in MAU and discussed f/u plan.     Assessment and Plan   A:  1. [redacted] weeks gestation of pregnancy   2. Subchorionic hematoma in first trimester, single or unspecified fetus   3. Threatened miscarriage in early pregnancy   4. Vaginal bleeding in pregnancy     P:  Discharge home in stable condition Bleeding precautions Return to MAU if symptoms worsen Call the office tomorrow and scheduled appointment to discuss management Support given  Venia Carbon I, NP 01/15/2020 10:30 AM

## 2020-01-14 NOTE — Discharge Instructions (Signed)
Miscarriage A miscarriage is the loss of an unborn baby (fetus) before the 20th week of pregnancy. Follow these instructions at home: Medicines   Take over-the-counter and prescription medicines only as told by your doctor.  If you were prescribed antibiotic medicine, take it as told by your doctor. Do not stop taking the antibiotic even if you start to feel better.  Do not take NSAIDs unless your doctor says that this is safe for you. NSAIDs include aspirin and ibuprofen. These medicines can cause bleeding. Activity  Rest as directed. Ask your doctor what activities are safe for you.  Have someone help you at home during this time. General instructions  Write down how many pads you use each day and how soaked they are.  Watch the amount of tissue or clumps of blood (blood clots) that you pass from your vagina. Save any large amounts of tissue for your doctor.  Do not use tampons, douche, or have sex until your doctor approves.  To help you and your partner with the process of grieving, talk with your doctor or seek counseling.  When you are ready, meet with your doctor to talk about steps you should take for your health. Also, talk with your doctor about steps to take to have a healthy pregnancy in the future.  Keep all follow-up visits as told by your doctor. This is important. Contact a doctor if:  You have a fever or chills.  You have vaginal discharge that smells bad.  You have more bleeding. Get help right away if:  You have very bad cramps or pain in your back or belly.  You pass clumps of blood that are walnut-sized or larger from your vagina.  You pass tissue that is walnut-sized or larger from your vagina.  You soak more than 1 regular pad in an hour.  You get light-headed or weak.  You faint (pass out).  You have feelings of sadness that do not go away, or you have thoughts of hurting yourself. Summary  A miscarriage is the loss of an unborn baby before  the 20th week of pregnancy.  Follow your doctor's instructions for home care. Keep all follow-up appointments.  To help you and your partner with the process of grieving, talk with your doctor or seek counseling. This information is not intended to replace advice given to you by your health care provider. Make sure you discuss any questions you have with your health care provider. Document Revised: 12/23/2018 Document Reviewed: 10/06/2016 Elsevier Patient Education  2020 Elsevier Inc.  

## 2020-01-14 NOTE — MAU Note (Signed)
Sydney Woods is a 19 y.o. at [redacted]w[redacted]d here in MAU reporting: bleeding for a little bit over a month. Previous MAU visit showed a subchorionic hemorrhage. Is wearing a pad and changing it every few hours because it is dirty, not saturated. States a few days ago she vomited and passed a blood clot. No pain.   Onset of complaint: ongoing  Pain score: 0/10  Vitals:   01/14/20 1234  BP: (!) 99/54  Pulse: 71  Resp: 16  Temp: 98.7 F (37.1 C)  SpO2: 100%     Lab orders placed from triage: UA

## 2020-01-15 ENCOUNTER — Telehealth: Payer: Self-pay | Admitting: *Deleted

## 2020-01-15 NOTE — Telephone Encounter (Signed)
Call transferred from front office. Spoke with patient. Patient states she was seen in MAU for miscarriage on 01/14/20, calling to schedule OV to discuss tx options. Patient reports dark red vaginal bleeding, changing pad q2 hours, not saturated. Denies pain, N/V, fever/chills. Patient was last seen in office on 12/18/19, was advised on 12/20/19 to establish care with OB GYN. Patient states she never established care. Per review of Epic, patient was seen in MAU on 12/24/19 and again on 01/14/20. Advised patient I will review with Dr. Edward Jolly and return call, patient agreeable.

## 2020-01-15 NOTE — Telephone Encounter (Signed)
Call reviewed with Dr. Edward Jolly. Call returned to patient. Advised patient she was last seen in office on 12/18/19 and was advised to establish care with OBGYN. Dr. Edward Jolly has not been involved with care since last OV.   Advised patient to contact [employer] at [facility] [address], [address] [place], [place],  [place] [address], [phone] to schedule an OV or return to MAU today for further evaluation if unable to be seen today for treatment. Patient verbalizes understanding and is agreeable.   Routing to Dr. Edward Jolly for final review.

## 2020-01-16 NOTE — Telephone Encounter (Signed)
Spoke with patient. Advised per Dr. Edward Jolly. Patient states she is "just going to let things pass". No appt to date, patient has not scheduled appt. Strongly encouraged patient to call now to schedule OB appt for evaluation or return to MAU for active bleeding. Patient verbalizes understanding and is agreeable.   Routing to Dr. Marjorie Smolder.   Encounter closed.

## 2020-01-16 NOTE — Telephone Encounter (Signed)
I agree with patient seeing an OB office or returning back to MAU for active bleeding.  She also has recurrent miscarriage and needs an evaluation for this.

## 2020-01-17 ENCOUNTER — Other Ambulatory Visit: Payer: Self-pay

## 2020-01-17 ENCOUNTER — Telehealth: Payer: Self-pay | Admitting: Obstetrics and Gynecology

## 2020-01-17 ENCOUNTER — Inpatient Hospital Stay (HOSPITAL_COMMUNITY)
Admission: AD | Admit: 2020-01-17 | Discharge: 2020-01-17 | Disposition: A | Discharge status: Home / Self Care | Payer: BC Managed Care – PPO | Attending: Obstetrics and Gynecology | Admitting: Obstetrics and Gynecology

## 2020-01-17 DIAGNOSIS — Z833 Family history of diabetes mellitus: Secondary | ICD-10-CM | POA: Insufficient documentation

## 2020-01-17 DIAGNOSIS — O418X10 Other specified disorders of amniotic fluid and membranes, first trimester, not applicable or unspecified: Secondary | ICD-10-CM

## 2020-01-17 DIAGNOSIS — Z3A01 Less than 8 weeks gestation of pregnancy: Secondary | ICD-10-CM | POA: Insufficient documentation

## 2020-01-17 DIAGNOSIS — O4690 Antepartum hemorrhage, unspecified, unspecified trimester: Secondary | ICD-10-CM

## 2020-01-17 DIAGNOSIS — O039 Complete or unspecified spontaneous abortion without complication: Secondary | ICD-10-CM | POA: Diagnosis not present

## 2020-01-17 DIAGNOSIS — Z8249 Family history of ischemic heart disease and other diseases of the circulatory system: Secondary | ICD-10-CM | POA: Diagnosis not present

## 2020-01-17 DIAGNOSIS — O208 Other hemorrhage in early pregnancy: Secondary | ICD-10-CM | POA: Insufficient documentation

## 2020-01-17 DIAGNOSIS — O468X1 Other antepartum hemorrhage, first trimester: Secondary | ICD-10-CM

## 2020-01-17 DIAGNOSIS — N939 Abnormal uterine and vaginal bleeding, unspecified: Secondary | ICD-10-CM | POA: Diagnosis not present

## 2020-01-17 MED ORDER — HYDROCODONE-ACETAMINOPHEN 5-325 MG PO TABS: 1.0000 | ORAL_TABLET | Freq: Four times a day (QID) | ORAL | 0 refills | Status: DC | PRN

## 2020-01-17 MED ORDER — ONDANSETRON HCL 4 MG PO TABS: 4.0000 mg | ORAL_TABLET | Freq: Every day | ORAL | 0 refills | Status: DC | PRN

## 2020-01-17 MED ORDER — IBUPROFEN 600 MG PO TABS: 600.0000 mg | ORAL_TABLET | Freq: Four times a day (QID) | ORAL | 0 refills | Status: DC | PRN

## 2020-01-17 NOTE — MAU Provider Note (Signed)
History     CSN: [id number]  Arrival date and time: 01/17/20 1229   First Provider Initiated Contact with Patient 01/17/20 1418      Chief Complaint  Patient presents with  . Vaginal Bleeding   HPI   [name] is a 19 y.o. female G2P0010 with a known failed pregnancy @ 5 weeks, ? Twin gestation.  She was evaluated here in MAU on Sunday and was given options for watchful waiting, cytotec, or D &C. She had a D & C done by Dr. Quincy Simmonds in the past and preferred to go this route again for treatment. She desires to see Dr. Elza Rafter office since she has been going to this office for a while.  She spoke to the office on Monday and was told that she needed to establish care with an OB or come to the MAU if bleeding worsened. The patient was unsure what to do so she came back to MAU to get f/u. She is still having very light bleeding and states this has not changed since March. She has no pain.   OB History    Gravida  2   Para  0   Term  0   Preterm  0   AB  1   Living  0     SAB  1   TAB  0   Ectopic  0   Multiple  0   Live Births  0           Past Medical History:  Diagnosis Date  . Anxiety   . Chlamydia   . Dyspnea    SOB WHEN GETS NERVOUS FOR 4 DAYS LAST TIME 08-08-2019  . PID (pelvic inflammatory disease)   . Syncope    NONE RECENT    Past Surgical History:  Procedure Laterality Date  . DILATION AND EVACUATION N/A 08/09/2019   Procedure: DILATATION AND EVACUATION;  Surgeon: Nunzio Cobbs, MD;  Location: [facility];  Service: Gynecology;  Laterality: N/A;    Family History  Problem Relation Age of Onset  . Pancreatitis Mother   . Heart disease Mother   . Pancreatic cancer Mother   . Diabetes Father   . Leukemia Father   . Healthy Brother   . Alzheimer's disease Maternal Grandmother     Social History   Tobacco Use  . Smoking status: Never Smoker  . Smokeless tobacco: Never Used  Substance Use Topics  . Alcohol  use: No  . Drug use: Yes    Types: Marijuana    Comment: MARIJUANA LAST USED 07-25-2019    Allergies:  Allergies  Allergen Reactions  . Tomato Flavor [Flavoring Agent] Other (See Comments)    Bumps on tongue  . Citrus Other (See Comments)    Bumps on tongue     Medications Prior to Admission  Medication Sig Dispense Refill Last Dose  . Prenatal Vit-Fe Fumarate-FA (PRENATAL VITAMIN PO) Take by mouth.      US OB LESS THAN 14 WEEKS WITH OB TRANSVAGINAL  Addendum Date: 01/14/2020   ADDENDUM REPORT: 01/14/2020 15:58 ADDENDUM: Comparison to the prior study of 12/19/2019 was omitted from the initial dictation. On 12/19/2019, mean sac diameter was 5.5 mm corresponding to 5 weeks 2 days EGA. On 01/14/2020, mean sac diameter is 15.2 mm, corresponding to 6 weeks 2 days. This represents in inappropriate/inadequate increase in gestational sac size over 26 days. Findings are consistent with a failed pregnancy. Electronically Signed   By: Elta Guadeloupe  Tyron Russell M.D.   On: 01/14/2020 15:58   Result Date: 01/14/2020 CLINICAL DATA:  Vaginal bleeding in first trimester of pregnancy; LMP 11/09/2019 EXAM: OBSTETRIC <14 WK [place] AND TRANSVAGINAL OB US TECHNIQUE: Both transabdominal and transvaginal ultrasound examinations were performed for complete evaluation of the gestation as well as the maternal uterus, adnexal regions, and pelvic cul-de-sac. Transvaginal technique was performed to assess early pregnancy. COMPARISON:  12/19/2019 FINDINGS: Intrauterine gestational sac: Present, single, minimally irregular Yolk sac:  Present Embryo:  Present Cardiac Activity: Not identified Heart Rate: N/A  bpm CRL:  2.8 mm   5 w   5 d                  [place] EDC: 09/10/2020 Subchorionic hemorrhage:  Small subchronic hemorrhage Maternal uterus/adnexae: RIGHT ovary normal size and morphology 4.3 x 1.7 x 2.2 cm. LEFT ovary measures 3.8 x 1.3 x 1.8 cm and contains a small cyst 2.1 cm diameter. No free pelvic fluid or adnexal masses. IMPRESSION: Single  intrauterine gestation with crown-rump length corresponding to 5 weeks 5 days EGA. No fetal cardiac activity seen. Small subchronic hemorrhage. Findings are suspicious but not yet definitive for failed pregnancy. Recommend follow-up US in 10-14 days for definitive diagnosis. This recommendation follows SRU consensus guidelines: Diagnostic Criteria for Nonviable Pregnancy Early in the First Trimester. Malva Limes Med 2013; 160:7371-06. Electronically Signed: By: Ulyses Southward M.D. On: 01/14/2020 15:03   Review of Systems  Gastrointestinal: Negative for abdominal pain.  Genitourinary: Positive for vaginal bleeding.   Physical Exam   Blood pressure (!) 96/53, pulse 80, temperature 98.2 F (36.8 C), temperature source Oral, resp. rate 20, height 5\' 8"  (1.727 m), weight 63.7 kg, last menstrual period 11/09/2019, SpO2 100 %, unknown if currently breastfeeding.  Physical Exam  Constitutional: She is oriented to person, place, and time. She appears well-developed and well-nourished. No distress.  HENT:  Head: Normocephalic.  Eyes: Pupils are equal, round, and reactive to light.  Musculoskeletal:        General: Normal range of motion.  Neurological: She is alert and oriented to person, place, and time.  Skin: Skin is warm. She is not diaphoretic.  Psychiatric: Her behavior is normal.   MAU Course  Procedures  None  MDM  Patient has no emergent complaints today. She had a full workup 2 days ago in MAU. Her symptoms have not changed. She is concerned that she has no f/u with an office. She still desires watchful waiting. She declined cytotec or discussion of D&E at this time.  Called Dr. 11/11/2019 and discussed patient with her. Discussed patient's desire to see her for f/u with this miscarriage. Dr. Edward Jolly stated that on 4/7 she instructed her to seek care with OB office and the patient failed to do this. Informed Dr. 6/7 that there is a 30 day period where the patient is covered by the office after  being officially discharged. The patient states she was never discharged from the practice, however advised to seek OB care. Offered to schedule f/u appointment with our office at [facility] and have patient resume/transfer care to our practice. Dr. [facility] agreeable.   Assessment and Plan   A:  1. SAB (spontaneous abortion)   2. Subchorionic hematoma in first trimester, single or unspecified fetus   3. Vaginal bleeding in pregnancy     P:  Discharge home with strict return precautions F/u next week on Thursday- message sent to the office at [facility] to have patient put  on my schedule for continuity. If no bleeding would recommend Cytotec or surgical management. Patient agreeable.  O positive blood type. Return to MAU if syptoms worsen Rx: Ibuprofen, #10 vicodin, Zofran.    Duane Lope, NP 01/17/2020 6:16 PM

## 2020-01-17 NOTE — Discharge Instructions (Signed)
Miscarriage A miscarriage is the loss of an unborn baby (fetus) before the 20th week of pregnancy. Follow these instructions at home: Medicines   Take over-the-counter and prescription medicines only as told by your doctor.  If you were prescribed antibiotic medicine, take it as told by your doctor. Do not stop taking the antibiotic even if you start to feel better.  Do not take NSAIDs unless your doctor says that this is safe for you. NSAIDs include aspirin and ibuprofen. These medicines can cause bleeding. Activity  Rest as directed. Ask your doctor what activities are safe for you.  Have someone help you at home during this time. General instructions  Write down how many pads you use each day and how soaked they are.  Watch the amount of tissue or clumps of blood (blood clots) that you pass from your vagina. Save any large amounts of tissue for your doctor.  Do not use tampons, douche, or have sex until your doctor approves.  To help you and your partner with the process of grieving, talk with your doctor or seek counseling.  When you are ready, meet with your doctor to talk about steps you should take for your health. Also, talk with your doctor about steps to take to have a healthy pregnancy in the future.  Keep all follow-up visits as told by your doctor. This is important. Contact a doctor if:  You have a fever or chills.  You have vaginal discharge that smells bad.  You have more bleeding. Get help right away if:  You have very bad cramps or pain in your back or belly.  You pass clumps of blood that are walnut-sized or larger from your vagina.  You pass tissue that is walnut-sized or larger from your vagina.  You soak more than 1 regular pad in an hour.  You get light-headed or weak.  You faint (pass out).  You have feelings of sadness that do not go away, or you have thoughts of hurting yourself. Summary  A miscarriage is the loss of an unborn baby before  the 20th week of pregnancy.  Follow your doctor's instructions for home care. Keep all follow-up appointments.  To help you and your partner with the process of grieving, talk with your doctor or seek counseling. This information is not intended to replace advice given to you by your health care provider. Make sure you discuss any questions you have with your health care provider. Document Revised: 12/23/2018 Document Reviewed: 10/06/2016 Elsevier Patient Education  2020 Elsevier Inc.  

## 2020-01-17 NOTE — Telephone Encounter (Signed)
Phone call from MAU NP.   Patient is being seen for vaginal bleeding in pregnancy.   She has been diagnosed with a failed pregnancy.   She has not established care with an OB office as recommended on 12/20/19 at which time twin gestation was diagnosed by an ultrasound done 12/19/19 at the hospital through MAU visit.   I do recommend care with an Obstetric team due to her failed pregnancy and recurrent pregnancy loss.   The [facility] MD team will be contacted by NP in MAU and it is anticipated that they will assume care of the patient.

## 2020-01-17 NOTE — MAU Note (Signed)
Presents for VB, states been bleeding for approximately 1.5 months.  Reports seen in MAU, informed she was having a miscarriage.  Reports initially opted to let pregnancy "pass on its own".  Reports passing clots, but hasn't passed POC.

## 2020-01-25 ENCOUNTER — Encounter (INDEPENDENT_AMBULATORY_CARE_PROVIDER_SITE_OTHER): Payer: BC Managed Care – PPO | Admitting: Obstetrics and Gynecology

## 2020-01-25 NOTE — Progress Notes (Signed)
Patient did not keep her appointment today.    Duane Lope, NP 01/25/2020 5:15 PM

## 2020-02-04 ENCOUNTER — Inpatient Hospital Stay (HOSPITAL_COMMUNITY)
Admission: AD | Admit: 2020-02-04 | Discharge: 2020-02-04 | Disposition: A | Discharge status: Home / Self Care | Payer: BC Managed Care – PPO | Attending: Obstetrics and Gynecology | Admitting: Obstetrics and Gynecology

## 2020-02-04 ENCOUNTER — Inpatient Hospital Stay (HOSPITAL_COMMUNITY): Payer: BC Managed Care – PPO

## 2020-02-04 ENCOUNTER — Other Ambulatory Visit: Payer: Self-pay

## 2020-02-04 ENCOUNTER — Encounter (HOSPITAL_COMMUNITY): Payer: Self-pay | Admitting: Obstetrics and Gynecology

## 2020-02-04 DIAGNOSIS — Z833 Family history of diabetes mellitus: Secondary | ICD-10-CM | POA: Insufficient documentation

## 2020-02-04 DIAGNOSIS — Z3A12 12 weeks gestation of pregnancy: Secondary | ICD-10-CM | POA: Insufficient documentation

## 2020-02-04 DIAGNOSIS — O209 Hemorrhage in early pregnancy, unspecified: Secondary | ICD-10-CM | POA: Diagnosis not present

## 2020-02-04 DIAGNOSIS — O039 Complete or unspecified spontaneous abortion without complication: Secondary | ICD-10-CM | POA: Diagnosis not present

## 2020-02-04 DIAGNOSIS — O034 Incomplete spontaneous abortion without complication: Secondary | ICD-10-CM | POA: Diagnosis not present

## 2020-02-04 DIAGNOSIS — Z8249 Family history of ischemic heart disease and other diseases of the circulatory system: Secondary | ICD-10-CM | POA: Diagnosis not present

## 2020-02-04 DIAGNOSIS — Z3A01 Less than 8 weeks gestation of pregnancy: Secondary | ICD-10-CM | POA: Diagnosis not present

## 2020-02-04 LAB — CBC
HCT: 28.3 % — ABNORMAL LOW (ref 36.0–46.0)
Hemoglobin: 9.3 g/dL — ABNORMAL LOW (ref 12.0–15.0)
MCH: 31.2 pg (ref 26.0–34.0)
MCHC: 32.9 g/dL (ref 30.0–36.0)
MCV: 95 fL (ref 80.0–100.0)
Platelets: 221 10*3/uL (ref 150–400)
RBC: 2.98 MIL/uL — ABNORMAL LOW (ref 3.87–5.11)
RDW: 11.9 % (ref 11.5–15.5)
WBC: 9.1 10*3/uL (ref 4.0–10.5)
nRBC: 0 % (ref 0.0–0.2)

## 2020-02-04 LAB — HCG, QUANTITATIVE, PREGNANCY: hCG, Beta Chain, Quant, S: 2930 m[IU]/mL — ABNORMAL HIGH (ref <5)

## 2020-02-04 MED ORDER — MORPHINE SULFATE (PF) 4 MG/ML IV SOLN
4.0000 mg | Freq: Once | INTRAVENOUS | Status: AC
  Administered 2020-02-04: 4 mg via INTRAMUSCULAR
  Filled 2020-02-04: qty 1

## 2020-02-04 NOTE — MAU Provider Note (Signed)
History     CSN: [id number]  Arrival date and time: 02/04/20 0119   First Provider Initiated Contact with Patient 02/04/20 0215      Chief Complaint  Patient presents with  . Miscarriage   Sydney Woods is a 19 y.o. G2P0010 at [redacted]w[redacted]d by dates, but with known failed pregnancy at 5 weeks.  She presents today for Miscarriage.  She states that she has been having intermittent pain that "was unbearable."  She states "I will be good for a few seconds, then it gets bad fast."  Patient states she has been having heavy bleeding since yesterday and reports she has been "filling up pads by the hour." She endorses passing clots and reports "they are the size of golf balls and bigger."  Patient states she has taken ibuprofen and hydrocodone for the pain, but threw up shortly after.  Patient rates her pain a 7/10.  Patient states she has been experiencing nausea when her pain is at it's worse.  She also reports feeling weak.        OB History    Gravida  2   Para  0   Term  0   Preterm  0   AB  1   Living  0     SAB  1   TAB  0   Ectopic  0   Multiple  0   Live Births  0           Past Medical History:  Diagnosis Date  . Anxiety   . Chlamydia   . Dyspnea    SOB WHEN GETS NERVOUS FOR 4 DAYS LAST TIME 08-08-2019  . PID (pelvic inflammatory disease)   . Syncope    NONE RECENT    Past Surgical History:  Procedure Laterality Date  . DILATION AND EVACUATION N/A 08/09/2019   Procedure: DILATATION AND EVACUATION;  Surgeon: Nunzio Cobbs, MD;  Location: [facility];  Service: Gynecology;  Laterality: N/A;    Family History  Problem Relation Age of Onset  . Pancreatitis Mother   . Heart disease Mother   . Pancreatic cancer Mother   . Diabetes Father   . Leukemia Father   . Healthy Brother   . Alzheimer's disease Maternal Grandmother     Social History   Tobacco Use  . Smoking status: Never Smoker  . Smokeless tobacco: Never Used   Substance Use Topics  . Alcohol use: No  . Drug use: Yes    Types: Marijuana    Comment: MARIJUANA LAST USED 07-25-2019    Allergies:  Allergies  Allergen Reactions  . Tomato Flavor [Flavoring Agent] Other (See Comments)    Bumps on tongue  . Citrus Other (See Comments)    Bumps on tongue     Medications Prior to Admission  Medication Sig Dispense Refill Last Dose  . HYDROcodone-acetaminophen (NORCO/VICODIN) 5-325 MG tablet Take 1-2 tablets by mouth every 6 (six) hours as needed for moderate pain. 10 tablet 0   . ibuprofen (ADVIL) 600 MG tablet Take 1 tablet (600 mg total) by mouth every 6 (six) hours as needed for mild pain. 30 tablet 0   . ondansetron (ZOFRAN) 4 MG tablet Take 1 tablet (4 mg total) by mouth daily as needed for nausea or vomiting. 30 tablet 0   . Prenatal Vit-Fe Fumarate-FA (PRENATAL VITAMIN PO) Take by mouth.       Review of Systems  Constitutional: Negative for chills and fever.  Gastrointestinal:  Positive for abdominal pain, nausea and vomiting. Negative for constipation and diarrhea.  Genitourinary: Positive for vaginal bleeding. Negative for difficulty urinating, dysuria, pelvic pain and vaginal discharge.  Musculoskeletal: Negative for back pain.  Neurological: Negative for dizziness, light-headedness and headaches.   Physical Exam   Blood pressure 106/90, pulse (!) 127, temperature 98.6 F (37 C), temperature source Oral, resp. rate 18, last menstrual period 11/09/2019, SpO2 100 %, unknown if currently breastfeeding.  Physical Exam  Constitutional: She is oriented to person, place, and time. She appears well-developed and well-nourished.  Resting in bed, eyes closed.  Curled up.   HENT:  Head: Normocephalic and atraumatic.  Eyes: Conjunctivae are normal.  Cardiovascular: Normal rate.  Respiratory: Effort normal.  GI: Soft.  Genitourinary:    Vaginal bleeding present.  There is bleeding in the vagina.    Genitourinary Comments: Speculum exam  attempted, but not tolerated by patient.    Musculoskeletal:     Cervical back: Normal range of motion.  Neurological: She is alert and oriented to person, place, and time.  Skin: Skin is warm and dry.  Psychiatric: She has a normal mood and affect. Her behavior is normal.    MAU Course  Procedures Results for orders placed or performed during the hospital encounter of 02/04/20 (from the past 24 hour(s))  CBC     Status: Abnormal   Collection Time: 02/04/20  2:31 AM  Result Value Ref Range   WBC 9.1 4.0 - 10.5 K/uL   RBC 2.98 (L) 3.87 - 5.11 MIL/uL   Hemoglobin 9.3 (L) 12.0 - 15.0 g/dL   HCT 81.1 (L) 57.2 - 62.0 %   MCV 95.0 80.0 - 100.0 fL   MCH 31.2 26.0 - 34.0 pg   MCHC 32.9 30.0 - 36.0 g/dL   RDW 35.5 97.4 - 16.3 %   Platelets 221 150 - 400 K/uL   nRBC 0.0 0.0 - 0.2 %   US OB Comp Less 14 Wks  Result Date: 02/04/2020 CLINICAL DATA:  Initial evaluation for vaginal bleeding, history of failed pregnancy. EXAM: OBSTETRIC <14 WK ULTRASOUND TECHNIQUE: Transabdominal ultrasound was performed for evaluation of the gestation as well as the maternal uterus and adnexal regions. COMPARISON:  Prior ultrasound from 01/14/2020. FINDINGS: Intrauterine gestational sac: Present, now positioned within the lower uterine segment/cervical region. Yolk sac:  Not visualized. Embryo:  Not visualized. Cardiac Activity: Negative. Heart Rate: N/A bpm MSD:  16.8 mm   6 w   3 d Subchorionic hemorrhage:  None visualized. Maternal uterus/adnexae: Ovaries not visualized. No adnexal mass. Small volume free fluid within the pelvis. IMPRESSION: 1. Single intrauterine gestational sac, now low lying at the level of the lower uterine segment/cervix. Previously seen yolk sac and fetal pole no longer visualized. Findings meet definitive criteria for failed pregnancy. This follows SRU consensus guidelines: Diagnostic Criteria for Nonviable Pregnancy Early in the First Trimester. Macy Mis J Med [id number]. 2. No other  acute maternal uterine or adnexal abnormality identified. Electronically Signed   By: Rise Mu M.D.   On: 02/04/2020 03:02    MDM  Pelvic Exam Labs: CBC, hCG Ultrasound Pain Medication Assessment and Plan  19 year old G2P0010 Failed Pregnancy Vaginal Bleeding  -POC reviewed. -Exam performed and findings discussed.  -Offered and accepts pain medication.  -Will give Morphine 4mg  now. -Patient declines antiemetic -Will send for for evaluation of uterine contents  [place] 02/04/2020, 2:15 AM   Reassessment (3:14 AM) Incomplete Abortion  -02/06/2020 results as above. -  Patient informed of findings and that POC remain in LUS. -Discussed surgical and pharmacological methods for completing miscarriage and patient declines. -Bleeding Precautions given. -S/S of acute anemia discussed; ie. Syncope, dizziness, headaches, and SOB. -Patient verbalizes understanding. -Informed that message would be sent to [facility] for follow up appt in 2-3 weeks. -Patient questions if safe to take hydrocodone once at home and informed that she can take every 4 hours as prescribed. -No other questions or concerns. -Encouraged to call or return to MAU if symptoms worsen or with the onset of new symptoms. -Discharged to home in stable condition.  Cherre Robins MSN, CNM Advanced Practice Provider, [facility] for [employer]

## 2020-02-04 NOTE — Discharge Instructions (Signed)
Incomplete Miscarriage A miscarriage is the loss of an unborn baby (fetus) before the 20th week of pregnancy. In an incomplete miscarriage, parts of the fetus or placenta (afterbirth) remain in the body. Most miscarriages happen in the first 3 months of pregnancy. Sometimes, it happens before a woman even knows she is pregnant. Having a miscarriage can be an emotional experience. If you have had a miscarriage, talk with your health care provider about any questions you may have about miscarrying, the grieving process, and your future pregnancy plans. What are the causes? This condition may be caused by:  Problems with the genes or chromosomes that make it impossible for the baby to develop normally. These problems are most often the result of random errors that occur early in development, and are not passed from parent to child (not inherited).  Infection of the cervix or uterus.  Conditions that affect hormone balance in the body.  Problems with the cervix, such as the cervix opening and thinning before pregnancy is at term (cervical insufficiency).  Problems with the uterus, such as a uterus with an abnormal shape, fibroids in the uterus, or problems that were present from birth (congenital abnormalities).  Certain medical conditions.  Smoking, drinking alcohol, or using drugs.  Injury (trauma). Many times, the cause of a miscarriage is not known. What are the signs or symptoms? Symptoms of this condition include:  Vaginal bleeding or spotting, with or without cramps or pain.  Pain or cramping in the abdomen or lower back.  Passing fluid, tissue, or blood clots from the vagina. How is this diagnosed? This condition may be diagnosed based on:  A physical exam.  Ultrasound.  Blood tests.  Urine tests. How is this treated? An incomplete miscarriage may be treated with:  Dilation and curettage (D&C). This is a procedure in which the cervix is stretched open and the lining of  the uterus (endometrium) is scraped to remove any remaining tissue from the pregnancy.  Medicines, such as: ? Antibiotic medicine to treat infection. ? Medicine to help any remaining tissue pass out of your uterus. ? Medicine to reduce (contract) the size of the uterus. These medicines may be given if you have a lot of bleeding. If you have Rh negative blood and your baby was Rh positive, you will need a shot of medicine called Rh immunoglobulinto protect future babies from Rh blood problems. "Rh-negative" and "Rh-positive" refer to whether or not the blood has a specific protein found on the surface of red blood cells (Rh factor). Follow these instructions at home: Medicines   Take over-the-counter and prescription medicines only as told by your health care provider.  If you were prescribed antibiotic medicine, take your antibiotic as told by your health care provider. Do not stop taking the antibiotic even if you start to feel better.  Do not take NSAIDs, such as aspirin and ibuprofen, unless approved by your doctor. These medicines can cause bleeding. Activity  Rest as directed. Ask your health care provider what activities are safe for you.  Have someone help with home and family responsibilities during this time. General instructions  Keep track of the number of sanitary pads you use each day and how soaked (saturated) they are. Write down this information.  Monitor the amount of tissue or blood clots that you pass from your vagina. Save any large amounts of tissue for your health care provider to examine.  Do not use tampons, douche, or have sex until your health care provider   approves.  To help you and your partner with the process of grieving, talk with your health care provider or seek counseling to help cope with the pregnancy loss.  When you are ready, meet with your health care provider to discuss important steps you should take for your health, as well as steps to take in  order to have a healthy pregnancy in the future.  Keep all follow-up visits as told by your health care provider. This is important. Where to find more information  The American Congress of Obstetricians and Gynecologists: www.acog.org  U.[place]. Department of Health and Human Services Office of Women's Health: www.womenshealth.gov Contact a health care provider if:  You have a fever or chills.  You have a foul smelling vaginal discharge. Get help right away if:  You have severe cramps or pain in your back or abdomen.  You pass walnut-sized (or larger) blood clots or tissue from your vagina.  You have heavy bleeding, soaking more than 1 regular sanitary pad in an hour.  You become lightheaded or weak.  You pass out.  You have feelings of sadness that take over your thoughts, or you have thoughts of hurting yourself. Summary  In an incomplete miscarriage, parts of the fetus or placenta (afterbirth) remain in the body.  There are multiple treatment options for an incomplete miscarriage, talk to your health care provider about the best option for you.  Follow your health care provider's instructions for follow-up care.  To help you and your partner with the process of grieving, talk with your health care provider or seek counseling to help cope with the pregnancy loss. This information is not intended to replace advice given to you by your health care provider. Make sure you discuss any questions you have with your health care provider. Document Revised: 10/07/2017 Document Reviewed: 10/07/2016 Elsevier Patient Education  2020 Elsevier Inc.  

## 2020-02-04 NOTE — MAU Note (Signed)
Pt reports to MAU for bleeding and passing clots" golf ball" size. She states she is unsure of when the bleeding starts. Has used 1 pad an hour since yesterday.

## 2020-02-06 ENCOUNTER — Telehealth: Payer: Self-pay | Admitting: Family Medicine

## 2020-02-06 NOTE — Telephone Encounter (Signed)
Attempted to contact patient to get her rescheduled for SAB f/u. First attempted the phone was picked up, there was lots of background noise and then the phone hung up. Second attempt, no answer and a voicemail was left for the patient to give the office a call back to be scheduled for an appointment.

## 2020-07-23 ENCOUNTER — Encounter: Payer: Self-pay | Admitting: Obstetrics & Gynecology

## 2020-08-14 ENCOUNTER — Encounter (HOSPITAL_COMMUNITY): Payer: Self-pay | Admitting: *Deleted

## 2020-08-14 ENCOUNTER — Inpatient Hospital Stay (HOSPITAL_COMMUNITY)
Admission: AD | Admit: 2020-08-14 | Discharge: 2020-08-14 | Disposition: A | Discharge status: Home / Self Care | Payer: BC Managed Care – PPO | Attending: Obstetrics & Gynecology | Admitting: Obstetrics & Gynecology

## 2020-08-14 ENCOUNTER — Other Ambulatory Visit: Payer: Self-pay

## 2020-08-14 DIAGNOSIS — Z3201 Encounter for pregnancy test, result positive: Secondary | ICD-10-CM

## 2020-08-14 DIAGNOSIS — N926 Irregular menstruation, unspecified: Secondary | ICD-10-CM | POA: Insufficient documentation

## 2020-08-14 NOTE — MAU Note (Signed)
Pt ha positive HPT x2. Denies any vag bleeding or pain. Just want to make sure everything is ok. Had 2 miscarriages this year and is worried.

## 2020-08-14 NOTE — MAU Provider Note (Signed)
  S:   19 y.o. G2P0010 @Unknown  by LMP presents to MAU for pregnancy confirmation.  She denies abdominal pain or vaginal bleeding today.  2 positive tests at home.   O: BP 110/69   Pulse 82   Temp 98.4 F (36.9 C)   Resp 18   Ht 5\' 8"  (1.727 m)   Wt 54.7 kg   LMP 07/17/2020 (Within Days)   Breastfeeding Unknown   BMI 18.34 kg/m  Physical Examination: General appearance - alert, well appearing, and in no distress, oriented to person, place, and time and acyanotic, in no respiratory distress  No results found for this or any previous visit (from the past 48 hour(s)).  A: Missed menses  P: D/C home Informed pt we do not do pregnancy verifications in MAU Referred to [facility] for pregnancy test & verification Return to MAU as needed for pregnancy related emergencies  13/11/2019, NP 3:40 PM

## 2020-09-05 ENCOUNTER — Inpatient Hospital Stay (HOSPITAL_COMMUNITY)
Admission: AD | Admit: 2020-09-05 | Discharge: 2020-09-05 | Disposition: A | Discharge status: Home / Self Care | Payer: BC Managed Care – PPO | Attending: Obstetrics and Gynecology | Admitting: Obstetrics and Gynecology

## 2020-09-05 ENCOUNTER — Other Ambulatory Visit: Payer: Self-pay

## 2020-09-05 DIAGNOSIS — Z3201 Encounter for pregnancy test, result positive: Secondary | ICD-10-CM | POA: Diagnosis not present

## 2020-09-05 DIAGNOSIS — Z3481 Encounter for supervision of other normal pregnancy, first trimester: Secondary | ICD-10-CM

## 2020-09-05 NOTE — MAU Provider Note (Signed)
Positive Home Pregnancy Test  S [name] is a 19 y.o. G2P0010 patient who presents to MAU today with "desire to make sure everything is okay with her baby". She has a history of miscarriage and has not been able to establish care. Positive home pregnancy test 1 month ago with LMP at the end of October 2022 in the setting of regular periods. This is a desired pregnancy.  O BP 107/69    Pulse 98    Temp 98 F (36.7 C)    Resp 18    Wt 55.8 kg    LMP 07/04/2020    SpO2 100%    BMI 18.70 kg/m  Physical Exam  Gen: well-appearing, no acute distress, tearful HEENT: moist mucus membranes Resp: normal WOB Abd: soft, nontender, nondistended Neuro: no focal neuro deficits Psych: anxious appearing, denies safety concerns  A Medical screening exam complete. Pt pregnant but no concerns.  P Discharge from MAU in stable condition. Provided pt with list of prenatal clinics to establish care. Warning signs for worsening condition that would warrant emergency follow-up discussed.  Patient may return to MAU as needed. Provided return precautions for severe abdominal pain, vaginal bleeding or other concerns.  Sheila Oats, MD OB Fellow, Faculty Practice 09/05/2020 7:34 PM

## 2020-09-05 NOTE — MAU Note (Signed)
Dr Barb Merino in Triage to see pt and discuss plan of care. She gave written and verbal d/c instructions and pt then d/c home.

## 2020-09-05 NOTE — Discharge Instructions (Signed)
[facility] [employer] [facility] [employer] at [facility]       Phone: [phone]  [facility] for [employer] at [place]   Phone: [phone]  [facility] [employer] at [place]  Phone: [phone]  [facility] [employer] at [employer]  Phone: [phone]  [facility] [employer] at [place]  Phone: [phone]  [facility] at [facility]   Phone: [phone]  [facility]       Phone: [phone]  [facility]    Phone: [phone]   [facility]    Phone: [phone]  [facility]    Phone: [phone]  [facility]    Phone: [phone]  [facility]       Phone: [phone]   [facility]  Phone: [phone]  [facility]    Phone: [phone]  [facility]   Phone: [phone]  [facility]      Phone: [phone]  [facility]    Phone: [phone]   First Trimester of Pregnancy  The first trimester of pregnancy is from week 1 until the end of week 13 (months 1 through 3). During this time, your baby will begin to develop inside you. At 6-8 weeks, the eyes and face are formed, and the heartbeat can be seen on ultrasound. At the end of 12 weeks, all the baby's organs are formed. Prenatal care is all the medical care you receive before the birth of your baby. Make sure you get good prenatal care and follow all of your doctor's instructions. Follow these instructions at home: Medicines  Take over-the-counter and prescription medicines only as told by your doctor. Some medicines are safe and some medicines are not safe during pregnancy.  Take a prenatal vitamin that contains at least 600 micrograms (mcg) of folic acid.  If you have trouble pooping  (constipation), take medicine that will make your stool soft (stool softener) if your doctor approves. Eating and drinking   Eat regular, healthy meals.  Your doctor will tell you the amount of weight gain that is right for you.  Avoid raw meat and uncooked cheese.  If you feel sick to your stomach (nauseous) or throw up (vomit): ? Eat 4 or 5 small meals a day instead of 3 large meals. ? Try eating a few soda crackers. ? Drink liquids between meals instead of during meals.  To prevent constipation: ? Eat foods that are high in fiber, like fresh fruits and vegetables, whole grains, and beans. ? Drink enough fluids to keep your pee (urine) clear or pale yellow. Activity  Exercise only as told by your doctor. Stop exercising if you have cramps or pain in your lower belly (abdomen) or low back.  Do not exercise if it is too hot, too humid, or if you are in a place of great height (high altitude).  Try to avoid standing for long periods of time. Move your legs often if you must stand in one place for a long time.  Avoid heavy lifting.  Wear low-heeled shoes. Sit and stand up straight.  You can have sex unless your doctor tells you not to. Relieving pain and discomfort  Wear a good support bra if your breasts are sore.  Take warm water baths (sitz baths) to soothe pain or discomfort caused by hemorrhoids. Use hemorrhoid cream if your doctor says  says it is okay. °· Rest with your legs raised if you have leg cramps or low back pain. °· If you have puffy, bulging veins (varicose veins) in your legs: °? Wear support hose or compression stockings as told by your doctor. °? Raise (elevate) your feet for 15 minutes, 3-4 times a day. °? Limit salt in your food. °Prenatal care °· Schedule your prenatal visits by the twelfth week of pregnancy. °· Write down your questions. Take them to your prenatal visits. °· Keep all your prenatal visits as told by your doctor. This is  important. °Safety °· Wear your seat belt at all times when driving. °· Make a list of emergency phone numbers. The list should include numbers for family, friends, the hospital, and police and fire departments. °General instructions °· Ask your doctor for a referral to a local prenatal class. Begin classes no later than at the start of month 6 of your pregnancy. °· Ask for help if you need counseling or if you need help with nutrition. Your doctor can give you advice or tell you where to go for help. °· Do not use hot tubs, steam rooms, or saunas. °· Do not douche or use tampons or scented sanitary pads. °· Do not cross your legs for long periods of time. °· Avoid all herbs and alcohol. Avoid drugs that are not approved by your doctor. °· Do not use any tobacco products, including cigarettes, chewing tobacco, and electronic cigarettes. If you need help quitting, ask your doctor. You may get counseling or other support to help you quit. °· Avoid cat litter boxes and soil used by cats. These carry germs that can cause birth defects in the baby and can cause a loss of your baby (miscarriage) or stillbirth. °· Visit your dentist. At home, brush your teeth with a soft toothbrush. Be gentle when you floss. °Contact a doctor if: °· You are dizzy. °· You have mild cramps or pressure in your lower belly. °· You have a nagging pain in your belly area. °· You continue to feel sick to your stomach, you throw up, or you have watery poop (diarrhea). °· You have a bad smelling fluid coming from your vagina. °· You have pain when you pee (urinate). °· You have increased puffiness (swelling) in your face, hands, legs, or ankles. °Get help right away if: °· You have a fever. °· You are leaking fluid from your vagina. °· You have spotting or bleeding from your vagina. °· You have very bad belly cramping or pain. °· You gain or lose weight rapidly. °· You throw up blood. It may look like coffee grounds. °· You are around people who  have German measles, fifth disease, or chickenpox. °· You have a very bad headache. °· You have shortness of breath. °· You have any kind of trauma, such as from a fall or a car accident. °Summary °· The first trimester of pregnancy is from week 1 until the end of week 13 (months 1 through 3). °· To take care of yourself and your unborn baby, you will need to eat healthy meals, take medicines only if your doctor tells you to do so, and do activities that are safe for you and your baby. °· Keep all follow-up visits as told by your doctor. This is important as your doctor will have to ensure that your baby is healthy and growing well. °This information is not intended to replace advice given to you by your health care   Make sure you discuss any questions you have with your health care provider. Document Revised: 12/22/2018 Document Reviewed: 09/08/2016 Elsevier Patient Education  2020 [employer].

## 2020-09-05 NOTE — MAU Note (Signed)
.   Sydney Woods is a 19 y.o. at Unknown here in MAU reporting: she had a positive pregnancy test a month ago. States that she had 2 miscarriages over the last year and has been through a lot today and wants to make sure everything is ok with the baby LMP: 06/2020 Onset of complaint: today Pain score: 0 Vitals:   09/05/20 1851  BP: 107/69  Pulse: 98  Resp: 18  Temp: 98 F (36.7 C)  SpO2: 100%     FHT: Lab orders placed from triage:

## 2020-09-09 ENCOUNTER — Other Ambulatory Visit: Payer: Self-pay

## 2020-09-09 ENCOUNTER — Ambulatory Visit (INDEPENDENT_AMBULATORY_CARE_PROVIDER_SITE_OTHER): Payer: BC Managed Care – PPO | Admitting: Lactation Services

## 2020-09-09 ENCOUNTER — Encounter: Payer: Self-pay | Admitting: Lactation Services

## 2020-09-09 DIAGNOSIS — Z3201 Encounter for pregnancy test, result positive: Secondary | ICD-10-CM

## 2020-09-09 DIAGNOSIS — Z3200 Encounter for pregnancy test, result unknown: Secondary | ICD-10-CM

## 2020-09-09 LAB — POCT PREGNANCY, URINE: Preg Test, Ur: POSITIVE — AB

## 2020-09-09 MED ORDER — PROMETHAZINE HCL 25 MG PO TABS: 25.0000 mg | ORAL_TABLET | Freq: Four times a day (QID) | ORAL | 0 refills | Status: DC | PRN

## 2020-09-09 NOTE — Progress Notes (Addendum)
Patient here for UPT. UPT +.   Patient reports she does not have concerns about the pregnancy. She had a miscarriage in November 2020 and March 2021. They were early losses. She has been nauseated and vomiting. She is sipping Gingerale and eating small frequent meals. Phenergan ordered. Reviewed small frequent bland meals.   LMP end of October but unsure of date. Denies bleeding or pain. Dating [place] ordered. Can start [facility] post dating [place].   Patient taking PNV and has been taking Fe that she recently stopped as it was causing stomach upset.   She does not have an OB, she was sent to the MAU in November 2020 and in March 2021. She would like to establish care.   She reports she has called several clinics that report they do not take Medicaid. BCBS policy does not cover pregnancy related care.   Dating [place] ordered and scheduled for January 3 at 9 am. To arrive at 8:45 with full bladder. Will start [facility] after dating [place].

## 2020-09-16 ENCOUNTER — Ambulatory Visit
Admission: RE | Admit: 2020-09-16 | Discharge: 2020-09-16 | Disposition: A | Discharge status: Home / Self Care | Payer: BC Managed Care – PPO | Source: Ambulatory Visit | Attending: Obstetrics & Gynecology | Admitting: Obstetrics & Gynecology

## 2020-09-16 ENCOUNTER — Ambulatory Visit (INDEPENDENT_AMBULATORY_CARE_PROVIDER_SITE_OTHER): Payer: BC Managed Care – PPO | Admitting: *Deleted

## 2020-09-16 ENCOUNTER — Other Ambulatory Visit: Payer: Self-pay

## 2020-09-16 VITALS — BP 118/69 | HR 72 | Ht 68.0 in | Wt 121.7 lb

## 2020-09-16 DIAGNOSIS — N926 Irregular menstruation, unspecified: Secondary | ICD-10-CM

## 2020-09-16 DIAGNOSIS — Z3200 Encounter for pregnancy test, result unknown: Secondary | ICD-10-CM | POA: Diagnosis not present

## 2020-09-16 DIAGNOSIS — Z3687 Encounter for antenatal screening for uncertain dates: Secondary | ICD-10-CM | POA: Diagnosis not present

## 2020-09-16 DIAGNOSIS — Z3490 Encounter for supervision of normal pregnancy, unspecified, unspecified trimester: Secondary | ICD-10-CM

## 2020-09-16 DIAGNOSIS — Z3A00 Weeks of gestation of pregnancy not specified: Secondary | ICD-10-CM | POA: Diagnosis not present

## 2020-09-16 NOTE — Progress Notes (Signed)
Here for [place] results. Reviewed with Dr. Alysia Penna.  He instructed to inform patient US shows baby measures [redacted]w[redacted]d and no heartrate and that we should see heartrate at this stage . Recommends repeat US in 1 week to confirm if is loss or viable pregnancy. Patient's choice to have repeat US or proceed with treatment for loss. I explained [place] results to patient and recommendations per Dr. Alysia Penna. Patient denies any vaginal bleeding or pain.  Patient chooses repeat US . Scheduled for 09/24/20. Patient voices understanding of plan of care.  Erdine Hulen,RN

## 2020-09-16 NOTE — Progress Notes (Signed)
Agree with A & P. 

## 2020-09-17 ENCOUNTER — Telehealth: Payer: Self-pay | Admitting: Internal Medicine

## 2020-09-17 DIAGNOSIS — O039 Complete or unspecified spontaneous abortion without complication: Secondary | ICD-10-CM

## 2020-09-17 NOTE — Telephone Encounter (Signed)
There are two good options  [facility]   Or   [facility]    Both may want you to continue with your ob/gyn and they would see you in addition to your current doctor.  They are specialized in high risk pregnancies.     Have her look into both and let me know if she wants a referral ordered

## 2020-09-17 NOTE — Telephone Encounter (Signed)
   Patient calling, states her mother Sydney Woods was seen today and "Dr Lawerance Bach was going to put in a high risk OBYGYN referral" for [name] Patient has not been seen since 2019  Please advise

## 2020-09-18 NOTE — Telephone Encounter (Signed)
ordered

## 2020-09-18 NOTE — Addendum Note (Signed)
Addended by: Pincus Sanes on: 09/18/2020 04:53 PM   Modules accepted: Orders

## 2020-09-18 NOTE — Telephone Encounter (Signed)
Spoke with patient and info with phone numbers given for both places.  She will call back if referral is required.

## 2020-09-18 NOTE — Telephone Encounter (Signed)
Patient referral be faxed to [facility], fax # [phone]

## 2020-09-24 ENCOUNTER — Ambulatory Visit
Admission: RE | Admit: 2020-09-24 | Discharge: 2020-09-24 | Disposition: A | Discharge status: Home / Self Care | Payer: BC Managed Care – PPO | Source: Ambulatory Visit | Attending: Obstetrics and Gynecology | Admitting: Obstetrics and Gynecology

## 2020-09-24 ENCOUNTER — Encounter: Payer: Self-pay | Admitting: Family Medicine

## 2020-09-24 ENCOUNTER — Ambulatory Visit (INDEPENDENT_AMBULATORY_CARE_PROVIDER_SITE_OTHER): Payer: BC Managed Care – PPO | Admitting: Family Medicine

## 2020-09-24 ENCOUNTER — Other Ambulatory Visit: Payer: Self-pay

## 2020-09-24 VITALS — BP 111/64 | HR 76 | Wt 125.0 lb

## 2020-09-24 DIAGNOSIS — Z3A01 Less than 8 weeks gestation of pregnancy: Secondary | ICD-10-CM | POA: Diagnosis not present

## 2020-09-24 DIAGNOSIS — N926 Irregular menstruation, unspecified: Secondary | ICD-10-CM | POA: Insufficient documentation

## 2020-09-24 DIAGNOSIS — N96 Recurrent pregnancy loss: Secondary | ICD-10-CM | POA: Diagnosis not present

## 2020-09-24 DIAGNOSIS — O021 Missed abortion: Secondary | ICD-10-CM | POA: Diagnosis not present

## 2020-09-24 DIAGNOSIS — Z3490 Encounter for supervision of normal pregnancy, unspecified, unspecified trimester: Secondary | ICD-10-CM | POA: Diagnosis not present

## 2020-09-24 MED ORDER — ASPIRIN EC 81 MG PO TBEC: 81.0000 mg | DELAYED_RELEASE_TABLET | Freq: Every day | ORAL | 11 refills | Status: DC

## 2020-09-24 NOTE — Assessment & Plan Note (Signed)
Patient understandably disappointed. Discussed etiology not always apparent. Given this is third miscarriage offered additional workup, serologies sent as below. Also offered to start daily baby aspirin in addition to PNV, she accepts. Has already had imaging that rules out structural problems, though would need HSG to complete workup. Will message or call with results.

## 2020-09-24 NOTE — Progress Notes (Signed)
GYNECOLOGY OFFICE VISIT NOTE  History:   Tomara Youngberg is a 20 y.o. G2P0010 here today for [place] results.  Pregnancy confirmed in MAU on 09/05/2020, not seen Confirmed again on 09/09/2020 at [facility], ordered for dating [place]. At that time reported hx of two prior early pregnancy losses Had [place] on 09/16/2020 that showed definitive failed pregnancy, patient elected to repeat US in 1 week rather than proceed with treatment for loss Repeat US today showed essentially unchanged findings  Discussed results with patient, she is disappointed Would be interested in getting a workup as this is her third loss   Past Medical History:  Diagnosis Date  . Anxiety   . Chlamydia   . Dyspnea    SOB WHEN GETS NERVOUS FOR 4 DAYS LAST TIME 08-08-2019  . PID (pelvic inflammatory disease)   . Syncope    NONE RECENT    Past Surgical History:  Procedure Laterality Date  . DILATION AND EVACUATION N/A 08/09/2019   Procedure: DILATATION AND EVACUATION;  Surgeon: Patton Salles, MD;  Location: [facility];  Service: Gynecology;  Laterality: N/A;    The following portions of the patient's history were reviewed and updated as appropriate: allergies, current medications, past family history, past medical history, past social history, past surgical history and problem list.   Health Maintenance:  Normal pap and negative HRHPV: none, needs at next visit.  Normal mammogram: n/a.   Review of Systems:  Pertinent items noted in HPI and remainder of comprehensive ROS otherwise negative.  Physical Exam:  BP 111/64   Pulse 76   Wt 125 lb (56.7 kg)   BMI 19.01 kg/m  CONSTITUTIONAL: Well-developed, well-nourished female in no acute distress.  HEENT:  Normocephalic, atraumatic. External right and left ear normal. No scleral icterus.  NECK: Normal range of motion, supple, no masses noted on observation SKIN: No rash noted. Not diaphoretic. No erythema. No pallor. MUSCULOSKELETAL: Normal range  of motion. No edema noted. NEUROLOGIC: Alert and oriented to person, place, and time. Normal muscle tone coordination.  PSYCHIATRIC: Normal mood and affect. Normal behavior. Normal judgment and thought content. RESPIRATORY: Effort normal, no problems with respiration noted  Labs and Imaging No results found for this or any previous visit (from the past 168 hour(s)). US OB Transvaginal  Result Date: 09/24/2020 CLINICAL DATA:  Failed pregnancy, follow-up EXAM: TRANSVAGINAL OB ULTRASOUND TECHNIQUE: Transvaginal ultrasound was performed for complete evaluation of the gestation as well as the maternal uterus, adnexal regions, and pelvic cul-de-sac. COMPARISON:  09/16/2020 FINDINGS: Intrauterine gestational sac: Single Yolk sac:  Visualized. Embryo:  Visualized. Cardiac Activity: Not Visualized. Heart Rate: 0 bpm CRL: 13.7 mm 7 w 5 d (CRL previously measured 14 mm on 09/16/2020). Subchorionic hemorrhage:  None visualized. Maternal uterus/adnexae: Bilateral ovaries within normal limits. No free fluid within the pelvis. IMPRESSION: Findings meet definitive criteria for failed pregnancy. This follows SRU consensus guidelines: Diagnostic Criteria for Nonviable Pregnancy Early in the First Trimester. [facility] [phone]. Electronically Signed   By: Duanne Guess D.O.   On: 09/24/2020 14:18   US OB LESS THAN 14 WEEKS WITH OB TRANSVAGINAL  Result Date: 09/16/2020 CLINICAL DATA:  Possible pregnancy, dating EXAM: OBSTETRIC <14 WK [place] AND TRANSVAGINAL OB US TECHNIQUE: Both transabdominal and transvaginal ultrasound examinations were performed for complete evaluation of the gestation as well as the maternal uterus, adnexal regions, and pelvic cul-de-sac. Transvaginal technique was performed to assess early pregnancy. COMPARISON:  None. FINDINGS: Intrauterine gestational sac:  Single Yolk sac:  Visualized. Embryo:  Visualized. Cardiac Activity: Not Visualized. CRL: 14 mm   7 w   5 d                  [place] EDC:  04/30/2021 Subchorionic hemorrhage:  None visualized. Maternal uterus/adnexae: Trace free fluid. IMPRESSION: Findings meet definitive criteria for failed pregnancy. This follows SRU consensus guidelines: Diagnostic Criteria for Nonviable Pregnancy Early in the First Trimester. [facility] [phone]. Electronically Signed   By: Guadlupe Spanish M.D.   On: 09/16/2020 10:00      Assessment and Plan:   Problem List Items Addressed This Visit      Other   History of recurrent miscarriages - Primary    Patient understandably disappointed. Discussed etiology not always apparent. Given this is third miscarriage offered additional workup, serologies sent as below. Also offered to start daily baby aspirin in addition to PNV, she accepts. Has already had imaging that rules out structural problems, though would need HSG to complete workup. Will message or call with results.       Relevant Medications   aspirin EC 81 MG tablet   Other Relevant Orders   TSH   Lupus anticoagulant panel( [facility])   Cardiolipin antibodies, IgM+IgG      Routine preventative health maintenance measures emphasized. Please refer to After Visit Summary for other counseling recommendations.   Return if symptoms worsen or fail to improve.    Total face-to-face time with patient: 20 minutes.  Over 50% of encounter was spent on counseling and coordination of care.   Venora Maples, MD/MPH [facility] [employer], [facility]

## 2020-09-26 LAB — CARDIOLIPIN ANTIBODIES, IGM+IGG
Anticardiolipin IgG: <9 GPL U/mL (ref 0–14)
Anticardiolipin IgM: 10 MPL U/mL (ref 0–12)

## 2020-09-26 LAB — TSH: TSH: 1.5 u[IU]/mL (ref 0.450–4.500)

## 2020-09-26 LAB — LUPUS ANTICOAGULANT PANEL
Dilute Viper Venom Time: 26.5 s (ref 0.0–47.0)
PTT Lupus Anticoagulant: 37.2 s (ref 0.0–51.9)

## 2020-10-13 ENCOUNTER — Inpatient Hospital Stay (HOSPITAL_COMMUNITY): Payer: BC Managed Care – PPO

## 2020-10-13 ENCOUNTER — Inpatient Hospital Stay (HOSPITAL_COMMUNITY)
Admission: AD | Admit: 2020-10-13 | Discharge: 2020-10-13 | Disposition: A | Discharge status: Home / Self Care | Payer: BC Managed Care – PPO | Attending: Obstetrics & Gynecology | Admitting: Obstetrics & Gynecology

## 2020-10-13 ENCOUNTER — Other Ambulatory Visit: Payer: Self-pay

## 2020-10-13 DIAGNOSIS — Z3A01 Less than 8 weeks gestation of pregnancy: Secondary | ICD-10-CM

## 2020-10-13 DIAGNOSIS — O034 Incomplete spontaneous abortion without complication: Secondary | ICD-10-CM

## 2020-10-13 DIAGNOSIS — Z6790 Unspecified blood type, Rh positive: Secondary | ICD-10-CM | POA: Diagnosis not present

## 2020-10-13 DIAGNOSIS — O021 Missed abortion: Secondary | ICD-10-CM

## 2020-10-13 DIAGNOSIS — Z7982 Long term (current) use of aspirin: Secondary | ICD-10-CM | POA: Insufficient documentation

## 2020-10-13 DIAGNOSIS — O209 Hemorrhage in early pregnancy, unspecified: Secondary | ICD-10-CM | POA: Diagnosis not present

## 2020-10-13 DIAGNOSIS — O4690 Antepartum hemorrhage, unspecified, unspecified trimester: Secondary | ICD-10-CM

## 2020-10-13 LAB — COMPREHENSIVE METABOLIC PANEL
ALT: 13 U/L (ref 0–44)
AST: 17 U/L (ref 15–41)
Albumin: 3.8 g/dL (ref 3.5–5.0)
Alkaline Phosphatase: 41 U/L (ref 38–126)
Anion gap: 10 (ref 5–15)
BUN: 8 mg/dL (ref 6–20)
CO2: 22 mmol/L (ref 22–32)
Calcium: 9.1 mg/dL (ref 8.9–10.3)
Chloride: 104 mmol/L (ref 98–111)
Creatinine, Ser: 0.62 mg/dL (ref 0.44–1.00)
GFR, Estimated: >60 mL/min (ref >60)
Glucose, Bld: 77 mg/dL (ref 70–99)
Potassium: 4 mmol/L (ref 3.5–5.1)
Sodium: 136 mmol/L (ref 135–145)
Total Bilirubin: 0.7 mg/dL (ref 0.3–1.2)
Total Protein: 6.9 g/dL (ref 6.5–8.1)

## 2020-10-13 LAB — CBC
HCT: 34.3 % — ABNORMAL LOW (ref 36.0–46.0)
Hemoglobin: 11.8 g/dL — ABNORMAL LOW (ref 12.0–15.0)
MCH: 31.1 pg (ref 26.0–34.0)
MCHC: 34.4 g/dL (ref 30.0–36.0)
MCV: 90.5 fL (ref 80.0–100.0)
Platelets: 290 10*3/uL (ref 150–400)
RBC: 3.79 MIL/uL — ABNORMAL LOW (ref 3.87–5.11)
RDW: 14.8 % (ref 11.5–15.5)
WBC: 6 10*3/uL (ref 4.0–10.5)
nRBC: 0 % (ref 0.0–0.2)

## 2020-10-13 LAB — HCG, QUANTITATIVE, PREGNANCY: hCG, Beta Chain, Quant, S: 9480 m[IU]/mL — ABNORMAL HIGH (ref <5)

## 2020-10-13 LAB — POCT PREGNANCY, URINE: Preg Test, Ur: POSITIVE — AB

## 2020-10-13 NOTE — Discharge Instructions (Signed)
Miscarriage A miscarriage is the loss of pregnancy before the 20th week. Most miscarriages happen during the first 3 months of pregnancy. Sometimes, a miscarriage can happen before a woman knows that she is pregnant. Having a miscarriage can be an emotional experience. If you have had a miscarriage, talk with your health care provider about any questions you may have about the loss of your baby, the grieving process, and your plans for future pregnancy. What are the causes? Many times, the cause of a miscarriage is not known. What increases the risk? The following factors may make a pregnant woman more likely to have a miscarriage: Certain medical conditions  Conditions that affect the hormone balance in the body, such as thyroid disease or polycystic ovary syndrome.  Diabetes.  Autoimmune disorders.  Infections.  Bleeding disorders.  Obesity. Lifestyle factors  Using products with tobacco or nicotine in them or being exposed to tobacco smoke.  Having alcohol.  Having large amounts of caffeine.  Recreational drug use. Problems with reproductive organs or structures  Cervical insufficiency. This is when the lowest part of the uterus (cervix) opens and thins before pregnancy is at term.  Having a condition called Asherman syndrome. This syndrome causes scarring in the uterus or causes the uterus to be abnormal in structure.  Fibrous growths, called fibroids, in the uterus.  Congenital abnormalities. These problems are present at birth.  Infection of the cervix or uterus. Personal or medical history  Injury (trauma).  Having had a miscarriage before.  Being younger than age 18 or older than age 35.  Exposure to harmful substances in the environment. This may include radiation or heavy metals, such as lead.  Use of certain medicines. What are the signs or symptoms? Symptoms of this condition include:  Vaginal bleeding or spotting, with or without cramps or  pain.  Pain or cramping in the abdomen or lower back.  Fluid or tissue coming out of the vagina. How is this diagnosed? This condition may be diagnosed based on:  A physical exam.  Ultrasound.  Lab tests, such as blood tests, urine tests, or swabs for infection. How is this treated? Treatment for a miscarriage is sometimes not needed if all the pregnancy tissue that was in the uterus comes out on its own, and there are no other problems such as infection or heavy bleeding. In other cases, this condition may be treated with:  Dilation and curettage (D&C). In this procedure, the cervix is stretched open and any remaining pregnancy tissue is removed from the lining of the uterus (endometrium).  Medicines. These may include: ? Antibiotic medicine, to treat infection. ? Medicine to help any remaining pregnancy tissue come out of the body. ? Medicine to reduce (contract) the size of the uterus. These medicines may be given if there is a lot of bleeding. If you have Rh-negative blood, you may be given an injection of a medicine called Rho(D) immune globulin. This medicine helps prevent problems with future pregnancies. Follow these instructions at home: Medicines  Take over-the-counter and prescription medicines only as told by your health care provider.  If you were prescribed antibiotic medicine, take it as told by your health care provider. Do not stop taking the antibiotic even if you start to feel better. Activity  Rest as told by your health care provider. Ask your health care provider what activities are safe for you.  Have someone help with home and family responsibilities during this time. General instructions  Monitor how much tissue   or blood clot material comes out of the vagina.  Do not have sex, douche, or put anything, such as tampons, in your vagina until your health care provider says it is okay.  To help you and your partner with the grieving process, talk with your  health care provider or get counseling.  When you are ready, meet with your health care provider to discuss any important steps you should take for your health. Also, discuss steps you should take to have a healthy pregnancy in the future.  Keep all follow-up visits. This is important.   Where to find more information  The [employer]nec[employer]: acog.org  U[place]. Department of Health and Programmer, systems of Women's Health: EverydayCosmetics.no Contact a health care provider if:  You have a fever or chills.  There is bad-smelling fluid coming from the vagina.  You have more bleeding instead of less.  Tissue or blood clots come out of your vagina. Get help right away if:  You have severe cramps or pain in your back or abdomen.  Heavy bleeding soaks through 2 large sanitary pads an hour for more than 2 hours.  You become light-headed or weak.  You faint.  You feel sad, and your sadness takes over your thoughts.  You think about hurting yourself. If you ever feel like you may hurt yourself or others, or have thoughts about taking your own life, get help right away. Go to your nearest emergency department or:  Call your local emergency services (911 in the [place]  Call a suicide crisis helpline, such as the [facility] at 250-433-1704. This is open 24 hours a day in the [place]  Text the Crisis Text Line at 4301527480 (in the [place] Summary  Most miscarriages happen in the first 3 months of pregnancy. Sometimes miscarriage happens before a woman knows that she is pregnant.  Follow instructions from your health care provider about medicines and activity.  To help you and your partner with grieving, talk with your health care provider or get counseling.  Keep all follow-up visits. This information is not intended to replace advice given to you by your health care provider. Make sure you discuss any questions you  have with your health care provider. Document Revised: 03/01/2020 Document Reviewed: 03/01/2020 Elsevier Patient Education  2021 [employer].        Managing Pregnancy Loss Pregnancy loss can happen any time during a pregnancy. Often the cause is not known. It is rarely because of anything you did. Pregnancy loss in early pregnancy (during the first trimester) is called a miscarriage. This type of pregnancy loss is the most common. Pregnancy loss that happens after 20 weeks of pregnancy is called fetal demise if the baby's heart stops beating before birth. Fetal demise is much less common. Some women experience spontaneous labor shortly after fetal demise resulting in a stillborn birth (stillbirth). Any pregnancy loss can be devastating. You will need to recover both physically and emotionally. Most women are able to get pregnant again after a pregnancy loss and deliver a healthy baby. How to manage emotional recovery Pregnancy loss is very hard emotionally. You may feel many different emotions while you grieve. You may feel sad and angry. You may also feel guilty. It is normal to have periods of crying. Emotional recovery can take longer than physical recovery. It is different for everyone. Taking these steps can help you in managing this loss:  Remember that it is unlikely  you did anything to cause the pregnancy loss.  Share your thoughts and feelings with friends, family, and your partner. Remember that your partner is also recovering emotionally.  Make sure you have a good support system. Do not spend too much time alone.  Meet with a pregnancy loss counselor or join a pregnancy loss support group.  Get enough sleep and eat a healthy diet. Return to regular exercise when you have recovered physically.  Do not use drugs or alcohol to manage your emotions.  Consider seeing a mental health professional to help you recover emotionally.  Ask a friend or loved one to help you decide  what to do with any clothing and nursery items you received for your baby. In the case of a stillbirth, many women benefit from taking additional steps in the grieving process. You may want to:  Hold your baby after the birth.  Name your baby.  Request a birth certificate.  Create a keepsake such as handprints or footprints.  Dress your baby and have a picture taken.  Make funeral arrangements.  Ask for a baptism or blessing. Hospitals have staff members who can help you with all these arrangements.   How to recognize emotional stress It is normal to have emotional stress after a pregnancy loss. But emotional stress that lasts a long time or becomes severe requires treatment. Watch out for these signs of severe emotional stress:  Sadness, anger, or guilt that is not going away and is interfering with your normal activities.  Relationship problems that have occurred or gotten worse since the pregnancy loss.  Signs of depression that last longer than 2 weeks. These may include: ? Sadness. ? Anxiety. ? Hopelessness. ? Loss of interest in activities you enjoy. ? Inability to concentrate. ? Trouble sleeping or sleeping too much. ? Loss of appetite or overeating. ? Thoughts of death or of hurting yourself. Follow these instructions at home:  Take over-the-counter and prescription medicines only as told by your health care provider.  Rest at home until your energy level returns. Return to your normal activities as told by your health care provider. Ask your health care provider what activities are safe for you.  When you are ready, meet with your health care provider to discuss steps to take for a future pregnancy.  Keep all follow-up visits as told by your health care provider. This is important. Where to find support  To help you and your partner with the process of grieving, talk with your health care provider or seek counseling.  Consider meeting with others who have  experienced pregnancy loss. Ask your health care provider about support groups and resources. Where to find more information  [place] Department of Health and Programmer, systems on Women's Health: [place]s.tn  American Pregnancy Association: www.americanpregnancy.org Contact a health care provider if:  You continue to experience grief, sadness, or lack of motivation for everyday activities, and those feelings do not improve over time.  You are struggling to recover emotionally, especially if you are using alcohol or substances to help. Get help right away if:  You have thoughts of hurting yourself or others. If you ever feel like you may hurt yourself or others, or have thoughts about taking your own life, get help right away. You can go to your nearest emergency department or call:  Your local emergency services (911 in the [place]  A suicide crisis helpline, such as the [facility] at 985-688-3203. This is open 24 hours  a day. Summary  Any pregnancy loss can be difficult physically and emotionally.  You may experience many different emotions while you grieve. Emotional recovery can last longer than physical recovery.  It is normal to have emotional stress after a pregnancy loss. But emotional stress that lasts a long time or becomes severe requires treatment.  See your health care provider if you are struggling emotionally after a pregnancy loss. This information is not intended to replace advice given to you by your health care provider. Make sure you discuss any questions you have with your health care provider. Document Revised: 12/21/2018 Document Reviewed: 11/11/2017 Elsevier Patient Education  2021 Else[employer] Inc.       FACTS YOU SHOULD KNOW  WHAT IS AN EARLY PREGNANCY FAILURE? Once the egg is fertilized with the sperm and begins to develop, it attaches to the lining of the uterus. This early pregnancy tissue may not develop into an  embryo (the beginning stage of a baby). Sometimes an embryo does develop but does not continue to grow. These problems can be seen on ultrasound.   MANAGEMNT OF EARLY PREGNANCY FAILURE: About 4 out of 100 (0.25%) women will have a pregnancy loss in her lifetime.  One in five pregnancies is found to be an early pregnancy failure.  There are 3 ways to care for an early pregnancy failure:   (1) Surgery, (2) Medicine, (3) Waiting for you to pass the pregnancy on your own. The decision as to how to proceed after being diagnosed with and early pregnancy failure is an individual one.  The decision can be made only after appropriate counseling.  You need to weigh the pros and cons of the 3 choices. Then you can make the choice that works for you. SURGERY (D&E) . Procedure over in 1 day . Requires being put to sleep . Bleeding may be light . Possible problems during surgery, including injury to womb(uterus) . Care provider has more control Medicine (CYTOTEC) . The complete procedure may take days to weeks . No Surgery . Bleeding may be heavy at times . There may be drug side effects . Patient has more control Waiting . You may choose to wait, in which case your own body may complete the passing of the abnormal early pregnancy on its own in about 2-4 weeks . Your bleeding may be heavy at times . There is a small possibility that you may need surgery if the bleeding is too much or not all of the pregnancy has passed. CYTOTEC MANAGEMENT Prostaglandins (cytotec) are the most widely used drug for this purpose. They cause the uterus to cramp and contract. You will place the medicine yourself inside your vagina in the privacy of your home. Empting of the uterus should occur within 3 days but the process may continue for several weeks. The bleeding may seem heavy at times. POSSIBLE SIDE EFFECTS FROM CYTOTEC . Nausea   Vomiting . Diarrhea Fever . Chills  Hot Flashes Side effects  from the process of the  early pregnancy failure include: . Cramping  Bleeding . Headaches  Dizziness RISKS: This is a low risk procedure. Less than 1 in 100 women has a complication. An incomplete passage of the early pregnancy may occur. Also, Hemorrhage (heavy bleeding) could happen.  Rarely the pregnancy will not be passed completely. Excessively heavy bleeding may occur.  Your doctor may need to perform surgery to empty the uterus (D&E). Afterwards: Everybody will feel differently after the early pregnancy completion. You may have  soreness or cramps for a day or two. You may have soreness or cramps for day or two.  You may have light bleeding for up to 2 weeks. You may be as active as you feel like being. If you have any of the following problems you may call [facility] at [phone]. . If you have pain that does not get better  with pain medication . Bleeding that soaks through 2 thick full-sized sanitary pads in an hour . Cramps that last longer than 2 days . Foul smelling discharge . Fever above 100.4 degrees F Even if you do not have any of these symptoms, you should have a follow-up exam to make sure you are healing properly. This appointment will be made for you before you leave the hospital. Your next normal period will start again in 4-6 week after the loss. You can get pregnant soon after the loss, so use birth control right away. Finally: Make sure all your questions are answered before during and after any procedure. Follow up with medical care and family planning methods.

## 2020-10-13 NOTE — MAU Provider Note (Signed)
History     CSN: [id number]  Arrival date and time: 10/13/20 1325   Event Date/Time   First Provider Initiated Contact with Patient 10/13/20 1442      Chief Complaint  Patient presents with   Vaginal Bleeding   Possible Pregnancy   Sydney Woods is a 20 y.o. G3P0030 at Unknown who presents to MAU for vaginal bleeding which began about 3-4 days ago. Patient was seen in the office on01/07/2021 and diagnosed with a failed pregnancy. Patient did not wish to proceed with cytotec or D&C at that time. Patient was scheduled for another [place] one week later, but did not make that appointment. Patient reports she is here today because she started bleeding and was experiencing some dark red bleeding and believes she may have passed some tissue. Patient is wanting to know whether or not she is still pregnant.  Passing blood clots? no Blood soaking clothes? no Lightheaded/dizzy? no Significant pelvic pain or cramping? no Passed any tissue? yes  Pt denies vaginal discharge/odor/itching. Pt denies N/V, abdominal pain, constipation, diarrhea, or urinary problems. Pt denies fever, chills, fatigue, sweating or changes in appetite. Pt denies SOB or chest pain. Pt denies dizziness, HA, light-headedness, weakness.   OB History    Gravida  3   Para  0   Term  0   Preterm  0   AB  3   Living  0     SAB  3   IAB  0   Ectopic  0   Multiple  0   Live Births  0           Past Medical History:  Diagnosis Date   Anxiety    Chlamydia    Dyspnea    SOB WHEN GETS NERVOUS FOR 4 DAYS LAST TIME 08-08-2019   PID (pelvic inflammatory disease)    Syncope    NONE RECENT    Past Surgical History:  Procedure Laterality Date   DILATION AND EVACUATION N/A 08/09/2019   Procedure: DILATATION AND EVACUATION;  Surgeon: Patton Salles, MD;  Location: [facility];  Service: Gynecology;  Laterality: N/A;    Family History  Problem Relation Age of Onset    Pancreatitis Mother    Heart disease Mother    Pancreatic cancer Mother    Diabetes Father    Leukemia Father    Healthy Brother    Alzheimer's disease Maternal Grandmother     Social History   Tobacco Use   Smoking status: Never Smoker   Smokeless tobacco: Never Used  Building services engineer Use: Never used  Substance Use Topics   Alcohol use: No   Drug use: Yes    Types: Marijuana    Comment: MARIJUANA LAST USED 07-25-2019    Allergies:  Allergies  Allergen Reactions   Tomato Flavor [Flavoring Agent] Other (See Comments)    Bumps on tongue   Citrus Other (See Comments)    Bumps on tongue     Medications Prior to Admission  Medication Sig Dispense Refill Last Dose   aspirin EC 81 MG tablet Take 1 tablet (81 mg total) by mouth daily. Swallow whole. 30 tablet 11    Prenatal Vit-Fe Fumarate-FA (PRENATAL VITAMIN PO) Take by mouth.      promethazine (PHENERGAN) 25 MG tablet Take 1 tablet (25 mg total) by mouth every 6 (six) hours as needed for nausea or vomiting. (Patient not taking: Reported on 09/24/2020) 30 tablet 0  Review of Systems  Constitutional: Negative for chills, diaphoresis, fatigue and fever.  Eyes: Negative for visual disturbance.  Respiratory: Negative for shortness of breath.   Cardiovascular: Negative for chest pain.  Gastrointestinal: Negative for abdominal pain, constipation, diarrhea, nausea and vomiting.  Genitourinary: Positive for vaginal bleeding. Negative for dysuria, flank pain, frequency, pelvic pain, urgency and vaginal discharge.  Neurological: Negative for dizziness, weakness, light-headedness and headaches.   Physical Exam   Blood pressure 119/65, pulse 78, temperature 98.5 F (36.9 C), resp. rate 16, unknown if currently breastfeeding.  Patient Vitals for the past 24 hrs:  BP Temp Pulse Resp  10/13/20 1410 119/65 98.5 F (36.9 C) 78 16   Physical Exam Vitals and nursing note reviewed.  Constitutional:       General: She is not in acute distress.    Appearance: Normal appearance. She is not ill-appearing, toxic-appearing or diaphoretic.  HENT:     Head: Normocephalic and atraumatic.  Pulmonary:     Effort: Pulmonary effort is normal.  Neurological:     Mental Status: She is alert and oriented to person, place, and time.  Psychiatric:        Mood and Affect: Mood normal.        Behavior: Behavior normal.        Thought Content: Thought content normal.        Judgment: Judgment normal.    Results for orders placed or performed during the hospital encounter of 10/13/20 (from the past 24 hour(s))  Pregnancy, urine POC     Status: Abnormal   Collection Time: 10/13/20  1:43 PM  Result Value Ref Range   Preg Test, Ur POSITIVE (A) NEGATIVE  CBC     Status: Abnormal   Collection Time: 10/13/20  3:23 PM  Result Value Ref Range   WBC 6.0 4.0 - 10.5 K/uL   RBC 3.79 (L) 3.87 - 5.11 MIL/uL   Hemoglobin 11.8 (L) 12.0 - 15.0 g/dL   HCT 51.0 (L) 25.8 - 52.7 %   MCV 90.5 80.0 - 100.0 fL   MCH 31.1 26.0 - 34.0 pg   MCHC 34.4 30.0 - 36.0 g/dL   RDW 78.2 42.3 - 53.6 %   Platelets 290 150 - 400 K/uL   nRBC 0.0 0.0 - 0.2 %  Comprehensive metabolic panel     Status: None   Collection Time: 10/13/20  3:23 PM  Result Value Ref Range   Sodium 136 135 - 145 mmol/L   Potassium 4.0 3.5 - 5.1 mmol/L   Chloride 104 98 - 111 mmol/L   CO2 22 22 - 32 mmol/L   Glucose, Bld 77 70 - 99 mg/dL   BUN 8 6 - 20 mg/dL   Creatinine, Ser 1.44 0.44 - 1.00 mg/dL   Calcium 9.1 8.9 - 31.5 mg/dL   Total Protein 6.9 6.5 - 8.1 g/dL   Albumin 3.8 3.5 - 5.0 g/dL   AST 17 15 - 41 U/L   ALT 13 0 - 44 U/L   Alkaline Phosphatase 41 38 - 126 U/L   Total Bilirubin 0.7 0.3 - 1.2 mg/dL   GFR, Estimated >40 >08 mL/min   Anion gap 10 5 - 15  hCG, quantitative, pregnancy     Status: Abnormal   Collection Time: 10/13/20  3:23 PM  Result Value Ref Range   hCG, Beta Chain, Quant, S 9,480 (H) <5 mIU/mL   US OB Transvaginal  Result  Date: 10/13/2020 CLINICAL DATA:  Vaginal bleeding, first trimester pregnancy  EXAM: TRANSVAGINAL OB ULTRASOUND TECHNIQUE: Transvaginal ultrasound was performed for complete evaluation of the gestation as well as the maternal uterus, adnexal regions, and pelvic cul-de-sac. COMPARISON:  09/24/2020 FINDINGS: Intrauterine gestational sac: Present, flattened Yolk sac:  Absent Embryo:  Present Cardiac Activity: Absent Heart Rate: N/A CRL:   1.1 mm   7 w 1 d Subchorionic hemorrhage:  Small Maternal uterus/adnexae: Right corpus luteum cyst. Left adnexal cyst 2.3 by 1.5 cm on image 18 IMPRESSION: 1. Failed pregnancy/embryonic demise. There is no cardiac activity and the embryo has not progressed in size compared to the 09/24/2020 exam. Small amount of subchorionic hemorrhage. Electronically Signed   By: Gaylyn Rong M.D.   On: 10/13/2020 16:58   US OB Transvaginal  Result Date: 09/24/2020 CLINICAL DATA:  Failed pregnancy, follow-up EXAM: TRANSVAGINAL OB ULTRASOUND TECHNIQUE: Transvaginal ultrasound was performed for complete evaluation of the gestation as well as the maternal uterus, adnexal regions, and pelvic cul-de-sac. COMPARISON:  09/16/2020 FINDINGS: Intrauterine gestational sac: Single Yolk sac:  Visualized. Embryo:  Visualized. Cardiac Activity: Not Visualized. Heart Rate: 0 bpm CRL: 13.7 mm 7 w 5 d (CRL previously measured 14 mm on 09/16/2020). Subchorionic hemorrhage:  None visualized. Maternal uterus/adnexae: Bilateral ovaries within normal limits. No free fluid within the pelvis. IMPRESSION: Findings meet definitive criteria for failed pregnancy. This follows SRU consensus guidelines: Diagnostic Criteria for Nonviable Pregnancy Early in the First Trimester. Macy Mis J Med [phone]. Electronically Signed   By: Duanne Guess D.O.   On: 09/24/2020 14:18   US OB LESS THAN 14 WEEKS WITH OB TRANSVAGINAL  Result Date: 09/16/2020 CLINICAL DATA:  Possible pregnancy, dating EXAM: OBSTETRIC <14 WK [place]  AND TRANSVAGINAL OB US TECHNIQUE: Both transabdominal and transvaginal ultrasound examinations were performed for complete evaluation of the gestation as well as the maternal uterus, adnexal regions, and pelvic cul-de-sac. Transvaginal technique was performed to assess early pregnancy. COMPARISON:  None. FINDINGS: Intrauterine gestational sac: Single Yolk sac:  Visualized. Embryo:  Visualized. Cardiac Activity: Not Visualized. CRL: 14 mm   7 w   5 d                  [place] EDC: 04/30/2021 Subchorionic hemorrhage:  None visualized. Maternal uterus/adnexae: Trace free fluid. IMPRESSION: Findings meet definitive criteria for failed pregnancy. This follows SRU consensus guidelines: Diagnostic Criteria for Nonviable Pregnancy Early in the First Trimester. Macy Mis J Med [phone]. Electronically Signed   By: Guadlupe Spanish M.D.   On: 09/16/2020 10:00    MAU Course  Procedures  MDM -failed pregnancy with possible passage of tissue -CBC: H/H 11.8/34.3 -CMP: WNL -[place]: flattened GS, embryo without cardiac activity, sm SCH -hCG: 9,480 -discussed cytotec protocol vs. D&C; patient reports she has had a D&C in the past and will consider both options, but is not yet ready to make a decision. Cytotec protocol and D&C procedure discussed. Patient questions asked and answered. Patient aware that someone from the office will call her tomorrow afternoon and she must be ready with a decision at that time as we do not want to let this pregnancy continue indefinitely due to concerns about a possible uterine infection/septic abortion. Patient agrees with plan. -message sent with high importance to [facility] clinical pool to call patient tomorrow afternoon for decision to help facilitate -pt discharged to home in stable condition   Orders Placed This Encounter  Procedures   US OB Transvaginal    Standing Status:   Standing    Number of Occurrences:  1    Order Specific Question:   Symptom/Reason for Exam    Answer:    Vaginal bleeding in pregnancy [705036]    Order Specific Question:   Symptom/Reason for Exam    Answer:   Missed abortion [632.ICD-9-CM]   CBC    Standing Status:   Standing    Number of Occurrences:   1   Comprehensive metabolic panel    Standing Status:   Standing    Number of Occurrences:   1   hCG, quantitative, pregnancy    Standing Status:   Standing    Number of Occurrences:   1   Pregnancy, urine POC    Standing Status:   Standing    Number of Occurrences:   1   Discharge patient    Order Specific Question:   Discharge disposition    Answer:   01-Home or Self Care [1]    Order Specific Question:   Discharge patient date    Answer:   10/13/2020   No orders of the defined types were placed in this encounter.   Assessment and Plan   1. Inevitable abortion   2. Vaginal bleeding in pregnancy   3. Missed abortion with fetal demise before 20 completed weeks of gestation   4. Blood type, Rh positive   5. [redacted] weeks gestation of pregnancy     Allergies as of 10/13/2020      Reactions   Tomato Flavor [flavoring Agent] Other (See Comments)   Bumps on tongue   Citrus Other (See Comments)   Bumps on tongue       Medication List    TAKE these medications   aspirin EC 81 MG tablet Take 1 tablet (81 mg total) by mouth daily. Swallow whole.   PRENATAL VITAMIN PO Take by mouth.   promethazine 25 MG tablet Commonly known as: PHENERGAN Take 1 tablet (25 mg total) by mouth every 6 (six) hours as needed for nausea or vomiting.      -discussed s/sx of miscarriage and normal expectations -strict bleeding/pain/return MAU precautions given -pt discharged to home in stable condition   Sydney Woods 10/13/2020, 5:34 PM

## 2020-10-13 NOTE — MAU Note (Signed)
Started having vaginal bleeding 3-4 days ago.  Was told she might be miscarrying approximately 1 month ago.  But did not believe it so she didn't go to any follow up.  Patient states she was having mild cramping yesterday.

## 2020-10-15 ENCOUNTER — Telehealth: Payer: Self-pay | Admitting: General Practice

## 2020-10-15 NOTE — Telephone Encounter (Signed)
Called patient stating I am following up with her since her MAU visit a few days ago and asked about cytotec vs D&C. Patient states she started to spot about 4-5 days ago and yesterday started bleeding heavier than a period with severe cramping. Patient also reports passing large clots. She states cramping is significantly less today and bleeding has slowed down to a little less than a period. Discussed with Dr Crissie Reese who states patient can have cytotec if she desires but isn't necessary. Discussed with patient it sounds like her body has started the process of miscarriage on its own but she can take cytotec if she desires. Patient states she thinks she will wait and see for now. Discussed expected bleeding pattern with patient and that menses should return in 4-6 weeks. Advised if bleeding becomes irregular, heavy to the point of saturating 2 pads in less than a hour over several days, shortness of breathe, weakness, dizzy, or fatigue she should return to MAU or call us back. Patient verbalized understanding to all.

## 2020-10-15 NOTE — Telephone Encounter (Signed)
-----   Message from Marylen Ponto, NP sent at 10/13/2020  5:24 PM EST ----- Regarding: Failed Pregnancy Good morning,  Patient was seen in MAU for follow-up on failed pregnancy originally diagnosed at [facility] 09/24/2020 and was undecided about if she wants a D&C or cytotec. Please call patient after lunch time tomorrow and patient will advise on decision for cytotec vs. D&C. Patient awre she may be required to have virtual appointment with [facility] provider for prescription of cytotec if desired. Cytotec protocol briefly discussed with patient in MAU and information on cytotec procedure given in discharge instructions.  Thank you, Joni Reining

## 2020-11-10 ENCOUNTER — Encounter (HOSPITAL_COMMUNITY): Payer: Self-pay

## 2020-11-10 ENCOUNTER — Ambulatory Visit (HOSPITAL_COMMUNITY)
Admission: EM | Admit: 2020-11-10 | Discharge: 2020-11-10 | Disposition: A | Discharge status: Home / Self Care | Payer: BC Managed Care – PPO | Attending: Family Medicine | Admitting: Family Medicine

## 2020-11-10 ENCOUNTER — Other Ambulatory Visit: Payer: Self-pay

## 2020-11-10 DIAGNOSIS — R109 Unspecified abdominal pain: Secondary | ICD-10-CM | POA: Diagnosis not present

## 2020-11-10 DIAGNOSIS — R112 Nausea with vomiting, unspecified: Secondary | ICD-10-CM

## 2020-11-10 DIAGNOSIS — Z3202 Encounter for pregnancy test, result negative: Secondary | ICD-10-CM

## 2020-11-10 DIAGNOSIS — R197 Diarrhea, unspecified: Secondary | ICD-10-CM | POA: Diagnosis not present

## 2020-11-10 LAB — POCT URINALYSIS DIPSTICK, ED / UC
Glucose, UA: NEGATIVE mg/dL
Leukocytes,Ua: NEGATIVE
Nitrite: NEGATIVE
Protein, ur: 30 mg/dL — AB
Specific Gravity, Urine: >=1.03 (ref 1.005–1.030)
Urobilinogen, UA: 0.2 mg/dL (ref 0.0–1.0)
pH: 5 (ref 5.0–8.0)

## 2020-11-10 LAB — CBC WITH DIFFERENTIAL/PLATELET
Abs Immature Granulocytes: 0.02 10*3/uL (ref 0.00–0.07)
Basophils Absolute: 0 10*3/uL (ref 0.0–0.1)
Basophils Relative: 0 %
Eosinophils Absolute: 0.2 10*3/uL (ref 0.0–0.5)
Eosinophils Relative: 3 %
HCT: 43.8 % (ref 36.0–46.0)
Hemoglobin: 14.3 g/dL (ref 12.0–15.0)
Immature Granulocytes: 0 %
Lymphocytes Relative: 27 %
Lymphs Abs: 1.8 10*3/uL (ref 0.7–4.0)
MCH: 30.3 pg (ref 26.0–34.0)
MCHC: 32.6 g/dL (ref 30.0–36.0)
MCV: 92.8 fL (ref 80.0–100.0)
Monocytes Absolute: 1 10*3/uL (ref 0.1–1.0)
Monocytes Relative: 15 %
Neutro Abs: 3.6 10*3/uL (ref 1.7–7.7)
Neutrophils Relative %: 55 %
Platelets: 343 10*3/uL (ref 150–400)
RBC: 4.72 MIL/uL (ref 3.87–5.11)
RDW: 13.2 % (ref 11.5–15.5)
WBC: 6.5 10*3/uL (ref 4.0–10.5)
nRBC: 0 % (ref 0.0–0.2)

## 2020-11-10 LAB — COMPREHENSIVE METABOLIC PANEL
ALT: 18 U/L (ref 0–44)
AST: 19 U/L (ref 15–41)
Albumin: 4.5 g/dL (ref 3.5–5.0)
Alkaline Phosphatase: 59 U/L (ref 38–126)
Anion gap: 11 (ref 5–15)
BUN: 12 mg/dL (ref 6–20)
CO2: 19 mmol/L — ABNORMAL LOW (ref 22–32)
Calcium: 9.4 mg/dL (ref 8.9–10.3)
Chloride: 104 mmol/L (ref 98–111)
Creatinine, Ser: 0.87 mg/dL (ref 0.44–1.00)
GFR, Estimated: >60 mL/min (ref >60)
Glucose, Bld: 105 mg/dL — ABNORMAL HIGH (ref 70–99)
Potassium: 3.7 mmol/L (ref 3.5–5.1)
Sodium: 134 mmol/L — ABNORMAL LOW (ref 135–145)
Total Bilirubin: 1.2 mg/dL (ref 0.3–1.2)
Total Protein: 8.4 g/dL — ABNORMAL HIGH (ref 6.5–8.1)

## 2020-11-10 LAB — LIPASE, BLOOD: Lipase: 25 U/L (ref 11–51)

## 2020-11-10 LAB — POC URINE PREG, ED: Preg Test, Ur: NEGATIVE

## 2020-11-10 MED ORDER — ONDANSETRON 4 MG PO TBDP: 4.0000 mg | ORAL_TABLET | Freq: Three times a day (TID) | ORAL | 0 refills | Status: DC | PRN

## 2020-11-10 NOTE — ED Triage Notes (Signed)
Pt reports abdominal pain, nausea and diarrhea x 2 days. States is worse whey laying down.

## 2020-11-10 NOTE — ED Provider Notes (Signed)
[facility]    CSN: [id number] Arrival date & time: 11/10/20  1719      History   Chief Complaint Chief Complaint  Patient presents with  . Abdominal Pain  . Diarrhea         HPI Sydney Woods is a 20 y.o. female.   Patient presenting today for 2-day history of nausea vomiting and diarrhea.  She states symptoms started suddenly while at work 2 days ago.  She has some mild diffuse cramping abdominal pain but no fever, chills, body aches, chest pain, shortness of breath, sore throat, new foods tried, sick contacts.  So far has not tried anything over-the-counter for symptoms.  Was able to eat some Drudge and drink some fluids this afternoon without vomiting.  Past medical history significant for a miscarriage 1 month ago with full passage of tissue at home.  Has been followed by OB/GYN for this.     Past Medical History:  Diagnosis Date  . Anxiety   . Chlamydia   . Dyspnea    SOB WHEN GETS NERVOUS FOR 4 DAYS LAST TIME 08-08-2019  . PID (pelvic inflammatory disease)   . Syncope    NONE RECENT    Patient Active Problem List   Diagnosis Date Noted  . 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 Subchorionic hematoma in first trimester 12/19/2019  . Threatened miscarriage in early pregnancy 12/19/2019  . Vaginal bleeding in pregnancy 12/19/2019  . Rash and nonspecific skin eruption 12/14/2017  . Microscopic hematuria 04/13/2017  . Cervicitis 04/13/2017  . History of chlamydia 04/13/2017  . Menstrual cramps 03/16/2017  . Syncope 10/23/2012    Past Surgical History:  Procedure Laterality Date  . DILATION AND EVACUATION N/A 08/09/2019   Procedure: DILATATION AND EVACUATION;  Surgeon: Patton Salles, MD;  Location: [facility];  Service: Gynecology;  Laterality: N/A;    OB History    Gravida  3   Para  0   Term  0   Preterm  0   AB  3   Living  0     SAB  3   IAB  0   Ectopic  0   Multiple  0   Live Births  0             Home Medications    Prior to Admission medications   Medication Sig Start Date End Date Taking? Authorizing Provider  ondansetron (ZOFRAN ODT) 4 MG disintegrating tablet Take 1 tablet (4 mg total) by mouth every 8 (eight) hours as needed for nausea or vomiting. 11/10/20  Yes Particia Nearing, PA-C  aspirin EC 81 MG tablet Take 1 tablet (81 mg total) by mouth daily. Swallow whole. 09/24/20   Venora Maples, MD  Prenatal Vit-Fe Fumarate-FA (PRENATAL VITAMIN PO) Take by mouth.    [provider]  promethazine (PHENERGAN) 25 MG tablet Take 1 tablet (25 mg total) by mouth every 6 (six) hours as needed for nausea or vomiting. Patient not taking: No sig reported 09/09/20   Adam Phenix, MD    Family History Family History  Problem Relation Age of Onset  . Pancreatitis Mother   . Heart disease Mother   . Pancreatic cancer Mother   . Diabetes Father   . Leukemia Father   . Healthy Brother   . Alzheimer's disease Maternal Grandmother     Social History Social History   Tobacco Use  . Smoking status: Never Smoker  .  Smokeless tobacco: Never Used  Vaping Use  . Vaping Use: Never used  Substance Use Topics  . Alcohol use: No  . Drug use: Yes    Types: Marijuana    Comment: MARIJUANA LAST USED 07-25-2019     Allergies   Tomato flavor [flavoring agent] and Citrus   Review of Systems Review of Systems Per HPI  Physical Exam Triage Vital Signs ED Triage Vitals  Enc Vitals Group     BP 11/10/20 1735 120/61     Pulse Rate 11/10/20 1735 80     Resp 11/10/20 1735 16     Temp 11/10/20 1735 98.3 F (36.8 C)     Temp Source 11/10/20 1735 Oral     SpO2 11/10/20 1735 97 %     Weight --      Height --      Head Circumference --      Peak Flow --      Pain Score 11/10/20 1732 6     Pain Loc --      Pain Edu? --      Excl. in GC? --    No data found.  Updated Vital Signs BP 120/61 (BP Location: Left Arm)   Pulse 80   Temp 98.3 F (36.8  C) (Oral)   Resp 16   SpO2 97%   Breastfeeding No   Visual Acuity Right Eye Distance:   Left Eye Distance:   Bilateral Distance:    Right Eye Near:   Left Eye Near:    Bilateral Near:     Physical Exam Vitals and nursing note reviewed.  Constitutional:      Appearance: Normal appearance. She is not ill-appearing.  HENT:     Head: Atraumatic.  Eyes:     Extraocular Movements: Extraocular movements intact.     Conjunctiva/sclera: Conjunctivae normal.  Cardiovascular:     Rate and Rhythm: Normal rate and regular rhythm.     Heart sounds: Normal heart sounds.  Pulmonary:     Effort: Pulmonary effort is normal.     Breath sounds: Normal breath sounds.  Musculoskeletal:        General: Normal range of motion.     Cervical back: Normal range of motion and neck supple.  Skin:    General: Skin is warm and dry.  Neurological:     Mental Status: She is alert and oriented to person, place, and time.  Psychiatric:        Mood and Affect: Mood normal.        Thought Content: Thought content normal.        Judgment: Judgment normal.      UC Treatments / Results  Labs (all labs ordered are listed, but only abnormal results are displayed) Labs Reviewed  POCT URINALYSIS DIPSTICK, ED / UC - Abnormal; Notable for the following components:      Result Value   Bilirubin Urine SMALL (*)    Ketones, ur TRACE (*)    Hgb urine dipstick TRACE (*)    Protein, ur 30 (*)    All other components within normal limits  CBC WITH DIFFERENTIAL/PLATELET  COMPREHENSIVE METABOLIC PANEL  LIPASE, BLOOD  POC URINE PREG, ED    EKG   Radiology No results found.  Procedures Procedures (including critical care time)  Medications Ordered in UC Medications - No data to display  Initial Impression / Assessment and Plan / UC Course  I have reviewed the triage vital signs and the nursing notes.  Pertinent labs & imaging results that were available during my care of the patient were reviewed  by me and considered in my medical decision making (see chart for details).     Urine pregnancy negative, urinalysis showing evidence of dehydration which is to be expected given her current symptoms.  No evidence of UTI at this time.  Basic labs pending at this time, vital signs and exam very reassuring and stable.  Will give Rx for as needed Zofran and have her increase her fluid intake eat a bland diet and follow-up with primary care early next week for recheck if not fully improved.  Strict ED precautions given for acutely worsening symptoms.  Final Clinical Impressions(s) / UC Diagnoses   Final diagnoses:  Nausea vomiting and diarrhea   Discharge Instructions   None    ED Prescriptions    Medication Sig Dispense Auth. Provider   ondansetron (ZOFRAN ODT) 4 MG disintegrating tablet Take 1 tablet (4 mg total) by mouth every 8 (eight) hours as needed for nausea or vomiting. 21 tablet Particia Nearing, [place]     PDMP not reviewed this encounter.   Particia Nearing, [place] 11/10/20 1805

## 2020-12-26 DIAGNOSIS — Z01419 Encounter for gynecological examination (general) (routine) without abnormal findings: Secondary | ICD-10-CM | POA: Diagnosis not present

## 2020-12-26 DIAGNOSIS — N96 Recurrent pregnancy loss: Secondary | ICD-10-CM | POA: Diagnosis not present

## 2021-01-19 IMAGING — US US OB TRANSVAGINAL
1 series · 14 of 28 positions shown · non-contrast
Comparison: None.
COMPARISON: None.

Addendum:
CLINICAL DATA: Bleeding.

EXAM:
TWIN OBSTETRICAL ULTRASOUND <14 WKS

[Series 1: us ob transvaginal · 32 acquisitions, 14 frames shown]
[im 2/32]
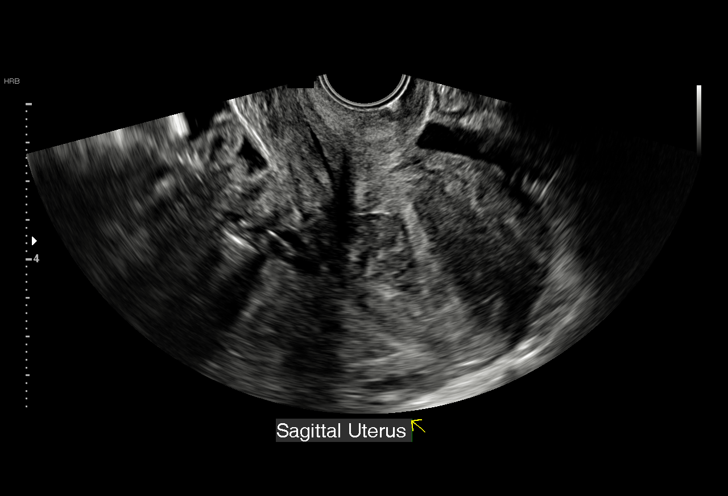
[im 4/32]
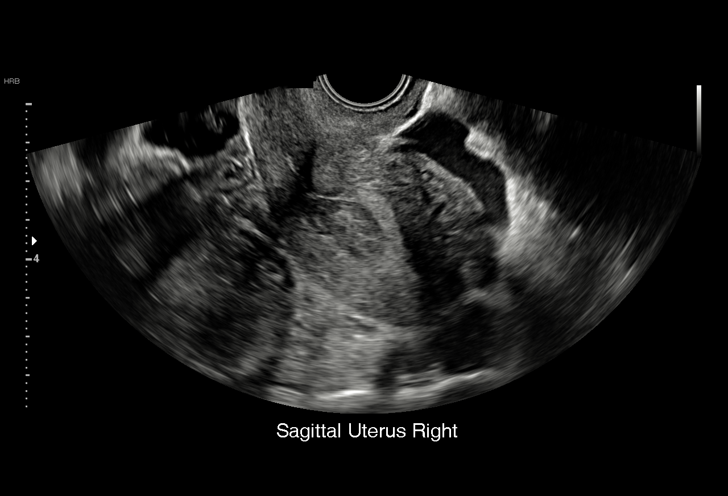
[im 6/32]
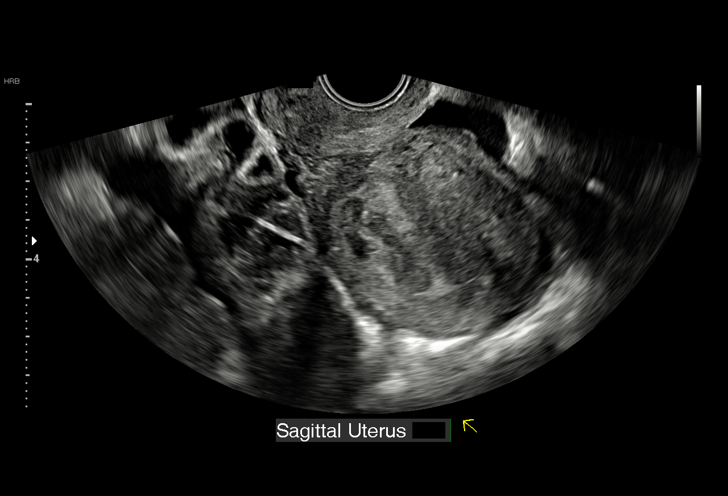
[im 9/32]
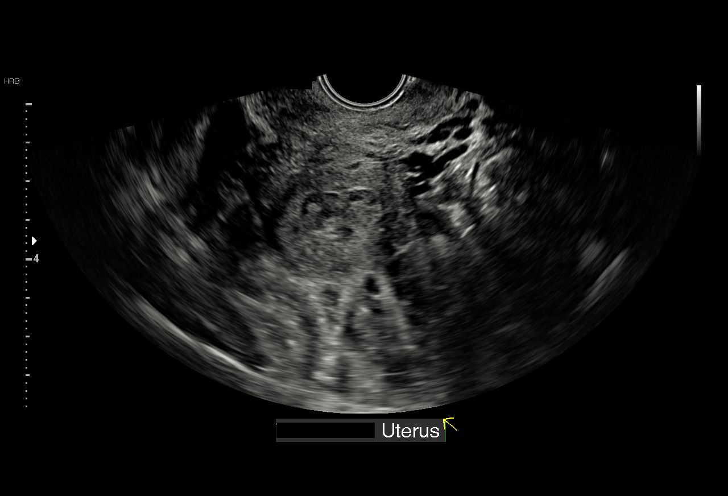
[im 11/32]
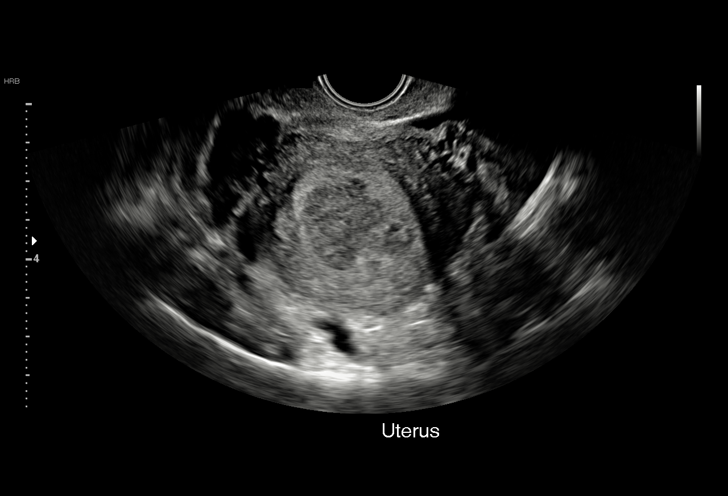
[im 13/32]
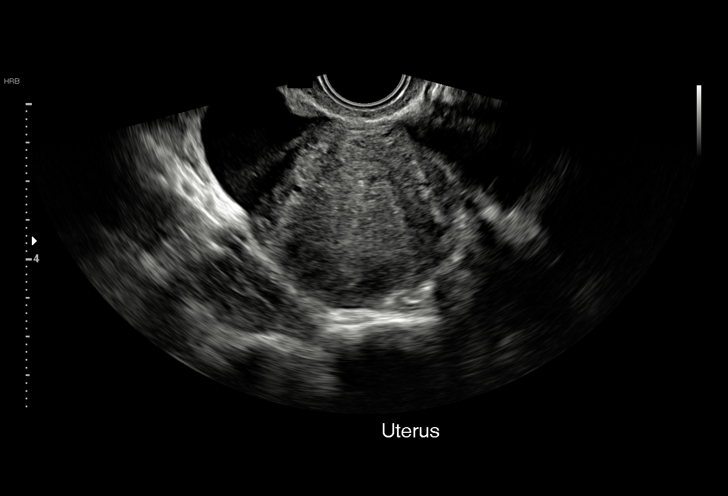
[im 15/32]
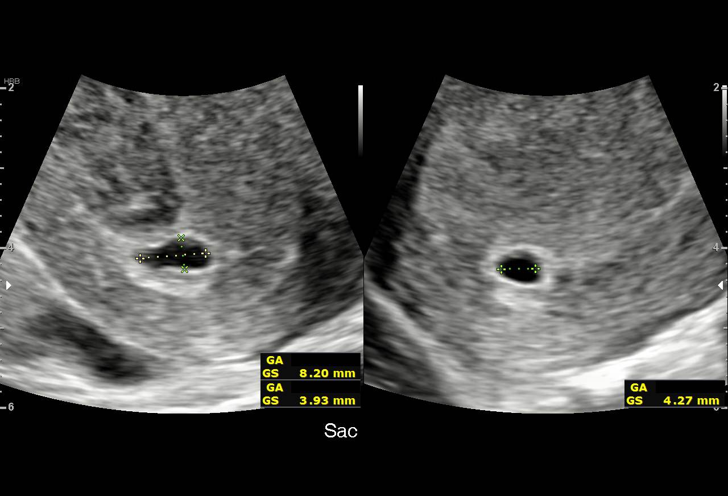
[im 18/32]
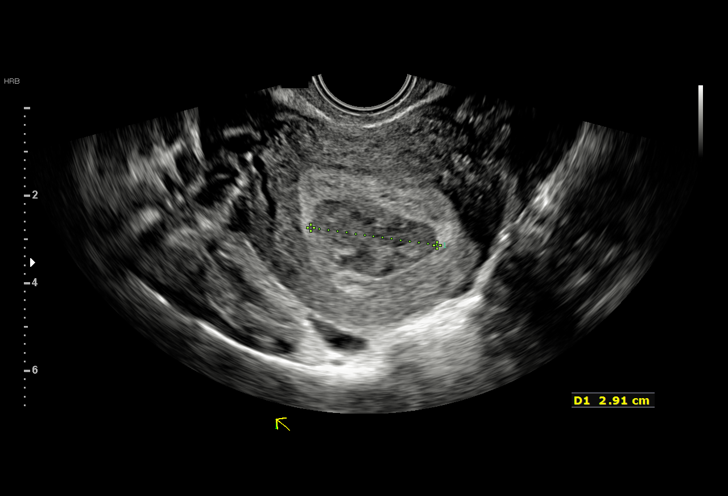
[im 20/32]
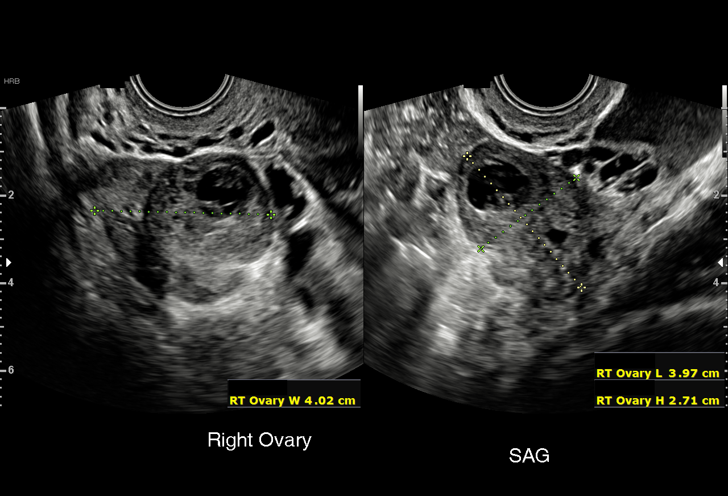
[im 22/32]
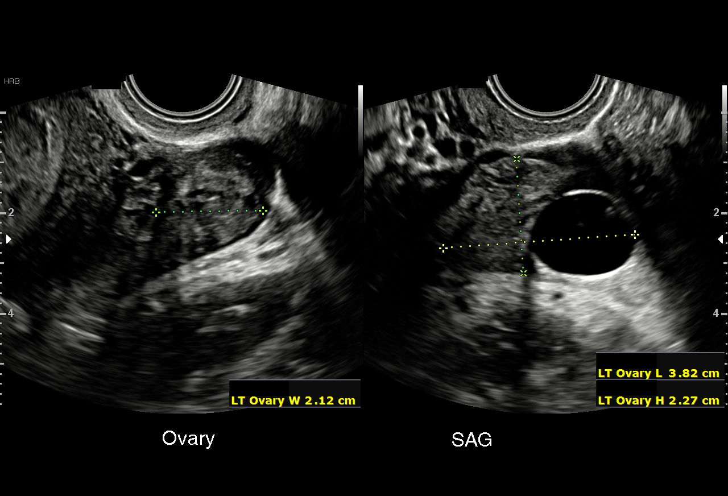
[im 25/32]
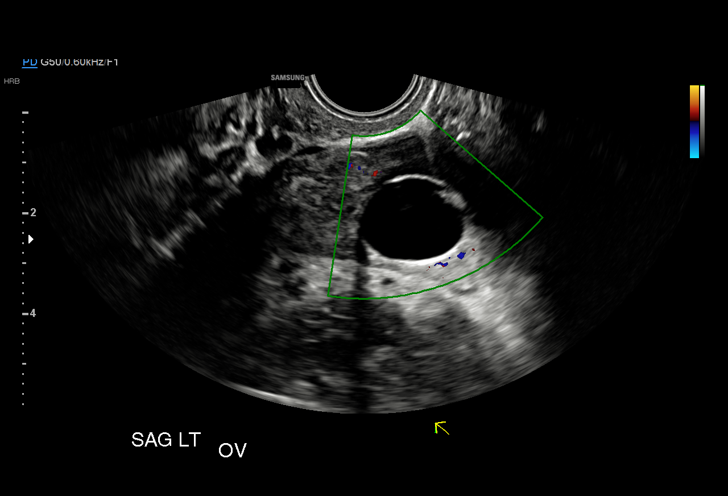
[im 27/32]
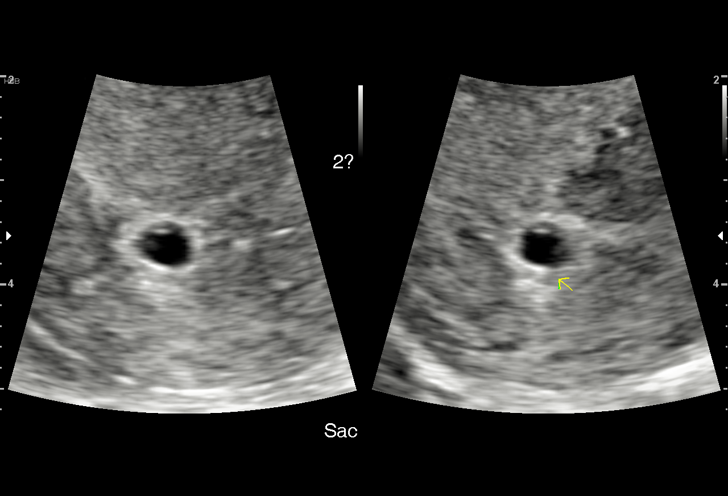
[im 29/32]
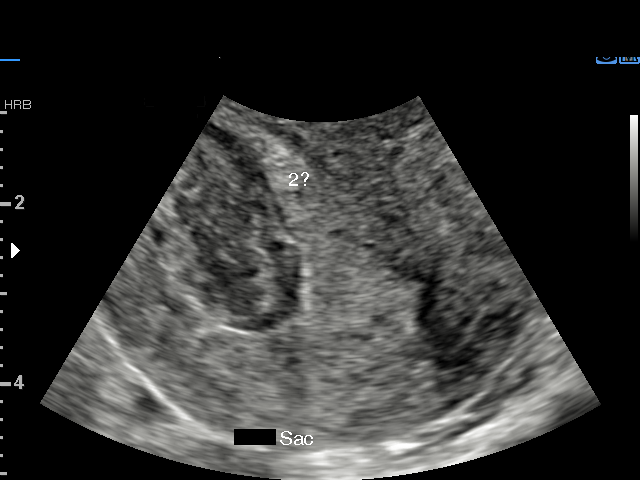
[im 32/32]
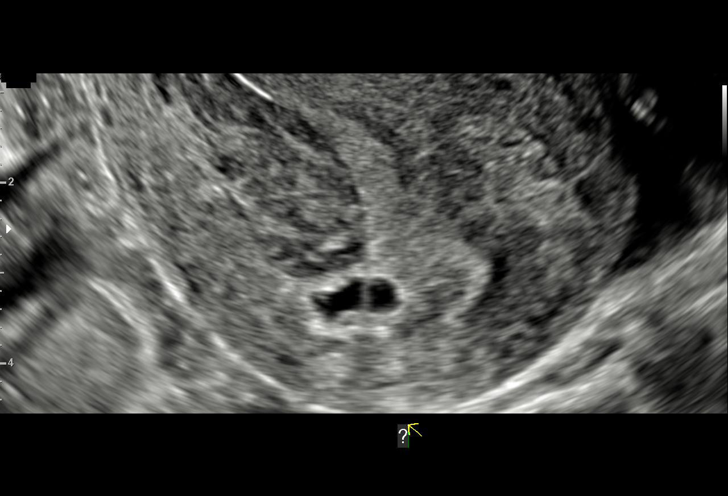

[14 of 28 positions shown; findings below may reference images not displayed]

FINDINGS: Number of IUPs:  2

Chorionicity/Amnionicity:  Unknown at this time

TWIN 1

Yolk sac:  Visualized.

Embryo:  Not Visualized.

Cardiac Activity: Not Visualized.

MSD: 5.47 mm   5 w   2 d

TWIN 2

Yolk sac:  Visualized.

Embryo:  Not Visualized.

Cardiac Activity: Not Visualized.

MSD: 4.38 mm   5 w   1 d

Subchorionic hemorrhage: There is a large volume of subchorionic
hemorrhage.

Maternal uterus/adnexae: The ovaries are unremarkable. There is a
moderate amount of free fluid in the patient's pelvis.
IMPRESSION: 1. Questionable twin pregnancy as detailed above. Probable early
intrauterine gestational sac, but no yolk sac, fetal pole, or
cardiac activity yet visualized. Recommend follow-up quantitative
B-HCG levels and follow-up US in 14 days to assess viability. This
recommendation follows SRU consensus guidelines: Diagnostic Criteria
for Nonviable Pregnancy Early in the First Trimester. N Engl J Med
1613; [DATE].
2. Moderate to large subchorionic hemorrhage.
3. Moderate to large volume of pelvic free fluid of unknown clinical
significance.

ADDENDUM:
In the impression of the report, it should state that there was a
yolk sac visualized within the potential twin pregnancy #1. A
questionable yolk sac was visualized in twin #2.

*** End of Addendum ***
FINDINGS: Number of IUPs:  2

Chorionicity/Amnionicity:  Unknown at this time

TWIN 1

Yolk sac:  Visualized.

Embryo:  Not Visualized.

Cardiac Activity: Not Visualized.

MSD: 5.47 mm   5 w   2 d

TWIN 2

Yolk sac:  Visualized.

Embryo:  Not Visualized.

Cardiac Activity: Not Visualized.

MSD: 4.38 mm   5 w   1 d

Subchorionic hemorrhage: There is a large volume of subchorionic
hemorrhage.

Maternal uterus/adnexae: The ovaries are unremarkable. There is a
moderate amount of free fluid in the patient's pelvis.
IMPRESSION: 1. Questionable twin pregnancy as detailed above. Probable early
intrauterine gestational sac, but no yolk sac, fetal pole, or
cardiac activity yet visualized. Recommend follow-up quantitative
B-HCG levels and follow-up US in 14 days to assess viability. This
recommendation follows SRU consensus guidelines: Diagnostic Criteria
for Nonviable Pregnancy Early in the First Trimester. N Engl J Med
1613; [DATE].
2. Moderate to large subchorionic hemorrhage.
3. Moderate to large volume of pelvic free fluid of unknown clinical
significance.

## 2021-02-13 DIAGNOSIS — Z3141 Encounter for fertility testing: Secondary | ICD-10-CM | POA: Diagnosis not present

## 2021-02-13 DIAGNOSIS — Z3202 Encounter for pregnancy test, result negative: Secondary | ICD-10-CM | POA: Diagnosis not present

## 2021-02-14 IMAGING — US US OB < 14 WEEKS - US OB TV
1 series · 14 of 28 positions shown · non-contrast
Comparison: 12/19/2019
COMPARISON: 12/19/2019

Addendum:
CLINICAL DATA: Vaginal bleeding in first trimester of pregnancy;
LMP 11/09/2019

EXAM:
OBSTETRIC <14 WK US AND TRANSVAGINAL OB US
TECHNIQUE: Both transabdominal and transvaginal ultrasound examinations were
performed for complete evaluation of the gestation as well as the
maternal uterus, adnexal regions, and pelvic cul-de-sac.
Transvaginal technique was performed to assess early pregnancy.

[Series 1: us ob < 14 weeks - us ob tv · 53 acquisitions, 14 frames shown]
[im 2/53]
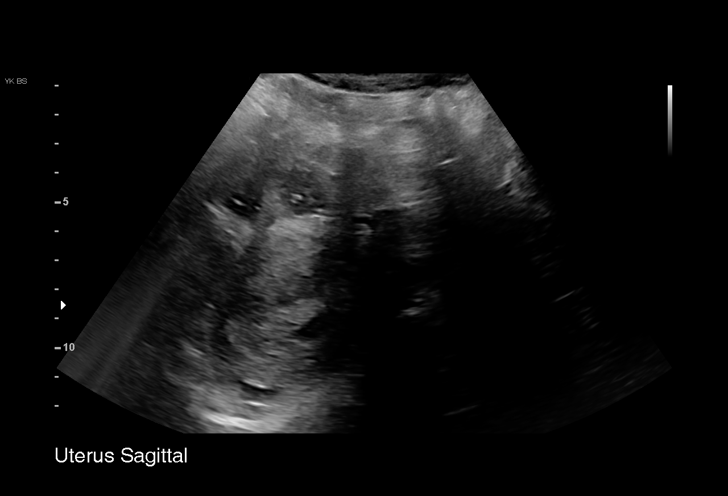
[im 6/53]
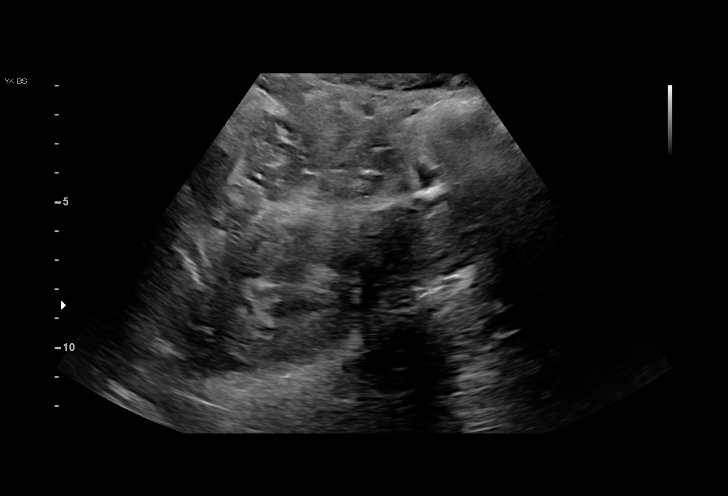
[im 10/53]
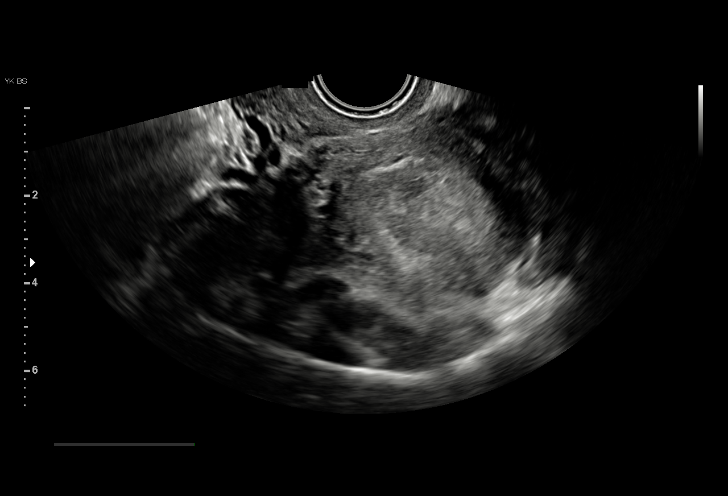
[im 14/53]
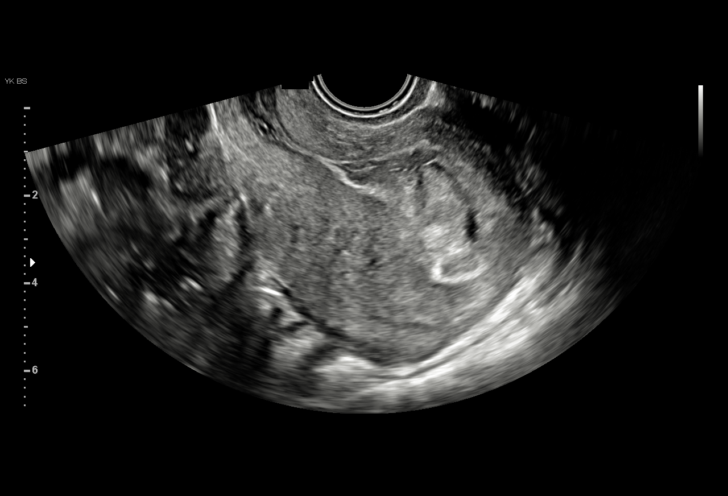
[im 18/53]
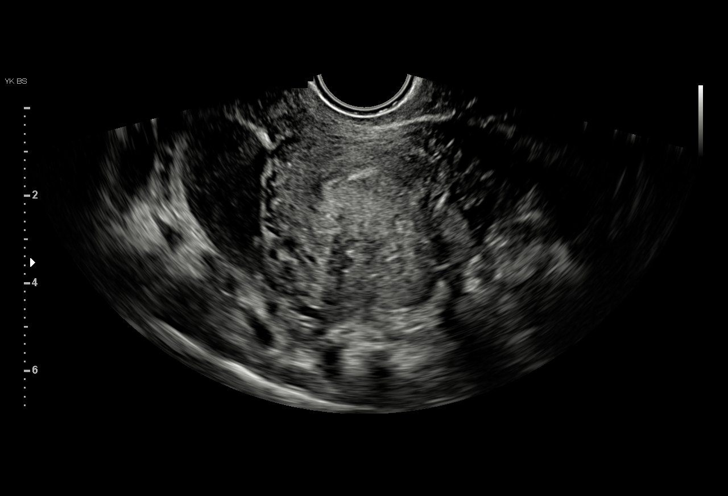
[im 22/53]
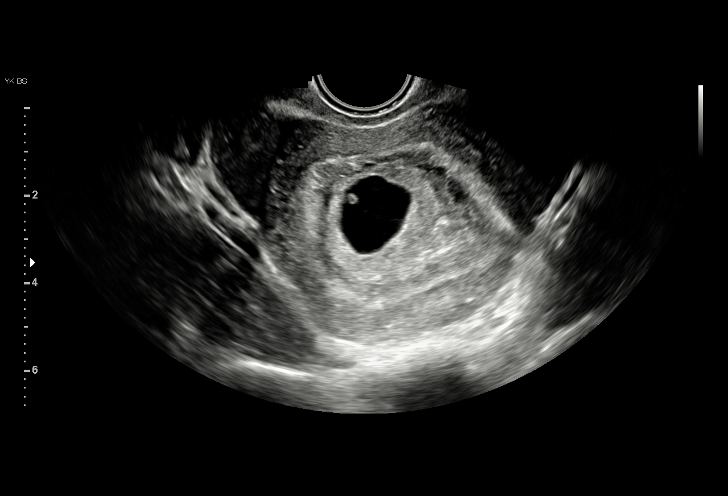
[im 26/53]
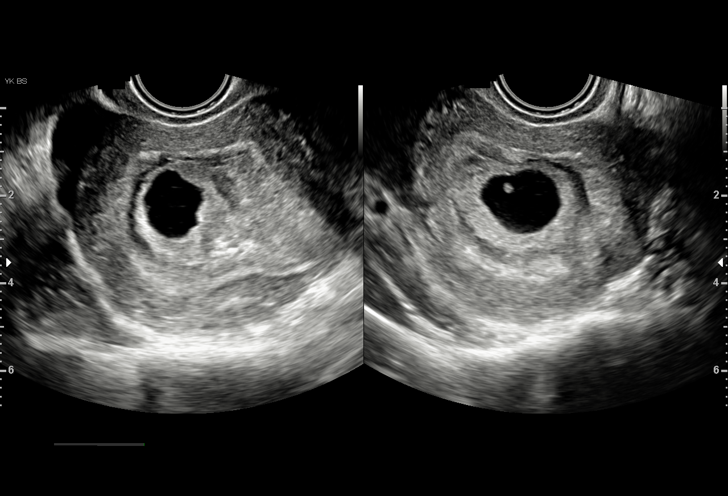
[im 29/53]
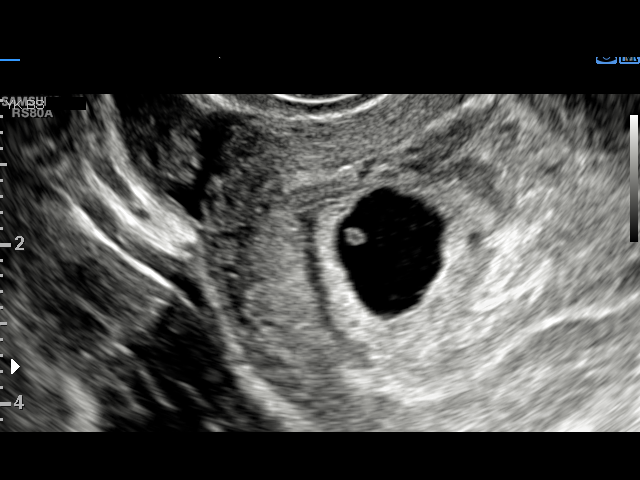
[im 33/53]
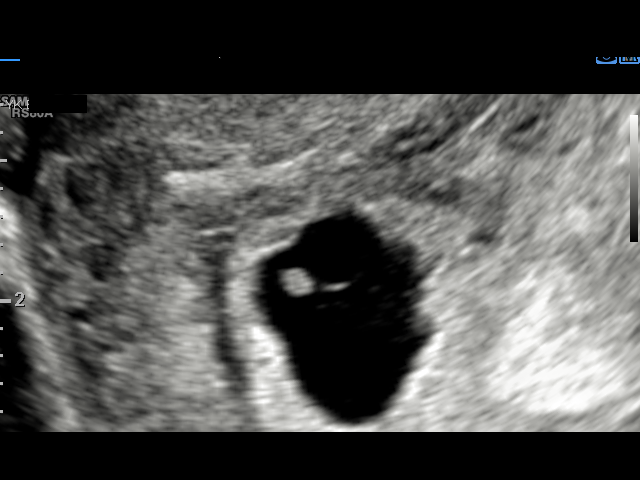
[im 37/53]
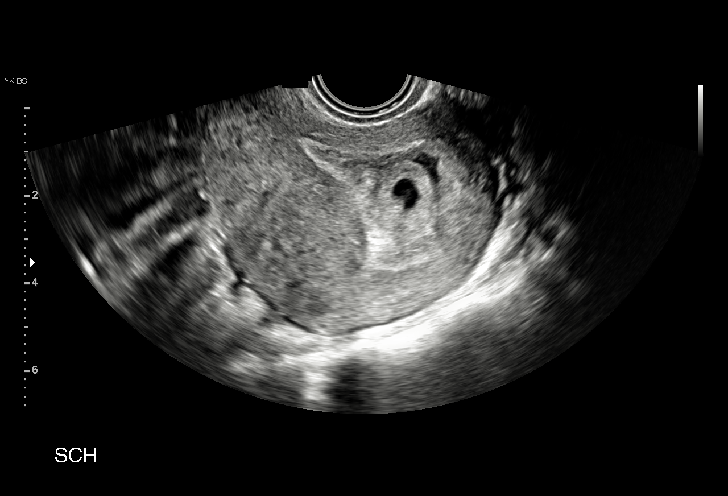
[im 41/53]
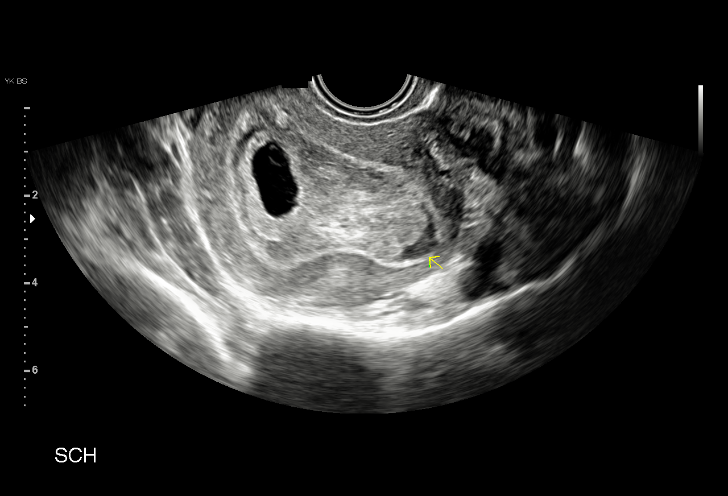
[im 45/53]
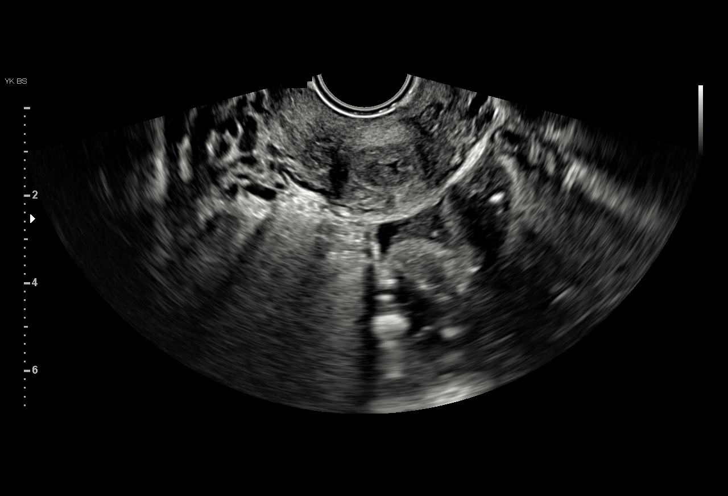
[im 49/53]
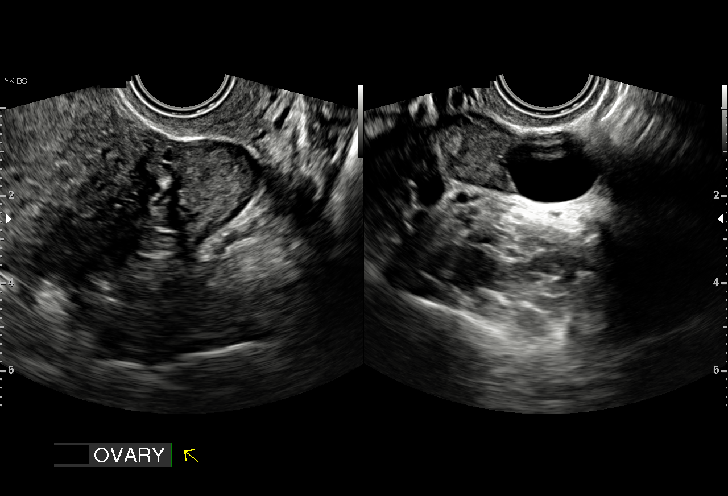
[im 53/53]
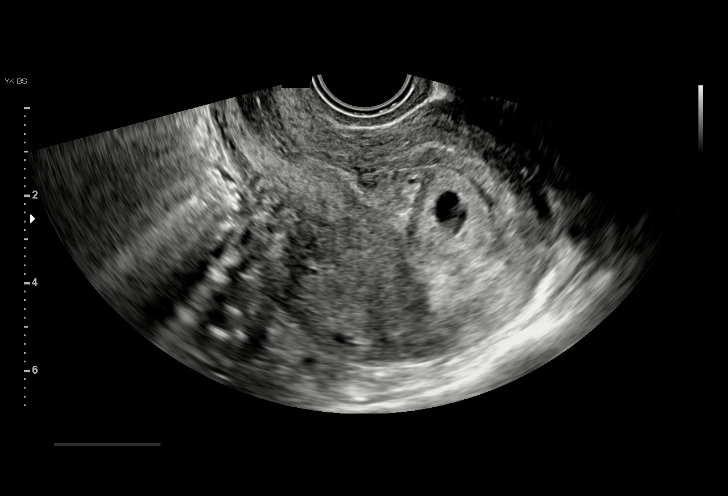

[14 of 28 positions shown; findings below may reference images not displayed]

FINDINGS: Intrauterine gestational sac: Present, single, minimally irregular

Yolk sac:  Present

Embryo:  Present

Cardiac Activity: Not identified

Heart Rate: N/A  bpm

CRL:  2.8 mm   5 w   5 d                  US EDC: 09/10/2020

Subchorionic hemorrhage:  Small subchronic hemorrhage

Maternal uterus/adnexae:

RIGHT ovary normal size and morphology 4.3 x 1.7 x 2.2 cm.

LEFT ovary measures 3.8 x 1.3 x 1.8 cm and contains a small cyst
cm diameter.

No free pelvic fluid or adnexal masses.
IMPRESSION: Single intrauterine gestation with crown-rump length corresponding
to 5 weeks 5 days EGA.

No fetal cardiac activity seen.

Small subchronic hemorrhage.

Findings are suspicious but not yet definitive for failed pregnancy.
Recommend follow-up US in 10-14 days for definitive diagnosis. This
recommendation follows SRU consensus guidelines: Diagnostic Criteria
for Nonviable Pregnancy Early in the First Trimester. N Engl J Med

ADDENDUM:
Comparison to the prior study of 12/19/2019 was omitted from the
initial dictation.

On 12/19/2019, mean sac diameter was 5.5 mm corresponding to 5 weeks
2 days EGA.

On 01/14/2020, mean sac diameter is 15.2 mm, corresponding to 6
weeks 2 days.

This represents in inappropriate/inadequate increase in gestational
sac size over 26 days.

Findings are consistent with a failed pregnancy.

*** End of Addendum ***
FINDINGS: Intrauterine gestational sac: Present, single, minimally irregular

Yolk sac:  Present

Embryo:  Present

Cardiac Activity: Not identified

Heart Rate: N/A  bpm

CRL:  2.8 mm   5 w   5 d                  US EDC: 09/10/2020

Subchorionic hemorrhage:  Small subchronic hemorrhage

Maternal uterus/adnexae:

RIGHT ovary normal size and morphology 4.3 x 1.7 x 2.2 cm.

LEFT ovary measures 3.8 x 1.3 x 1.8 cm and contains a small cyst
cm diameter.

No free pelvic fluid or adnexal masses.
IMPRESSION: Single intrauterine gestation with crown-rump length corresponding
to 5 weeks 5 days EGA.

No fetal cardiac activity seen.

Small subchronic hemorrhage.

Findings are suspicious but not yet definitive for failed pregnancy.
Recommend follow-up US in 10-14 days for definitive diagnosis. This
recommendation follows SRU consensus guidelines: Diagnostic Criteria
for Nonviable Pregnancy Early in the First Trimester. N Engl J Med

## 2021-03-07 IMAGING — US US OB COMP LESS 14 WK
1 series · 15 of 17 positions shown · non-contrast
Comparison: Prior ultrasound from 01/14/2020.

CLINICAL DATA: Initial evaluation for vaginal bleeding, history of
failed pregnancy.

EXAM:
OBSTETRIC <14 WK ULTRASOUND
TECHNIQUE: Transabdominal ultrasound was performed for evaluation of the
gestation as well as the maternal uterus and adnexal regions.

[Series 1: us ob comp less 14 wk · 17 acquisitions, 15 frames shown]
[im 1/17]
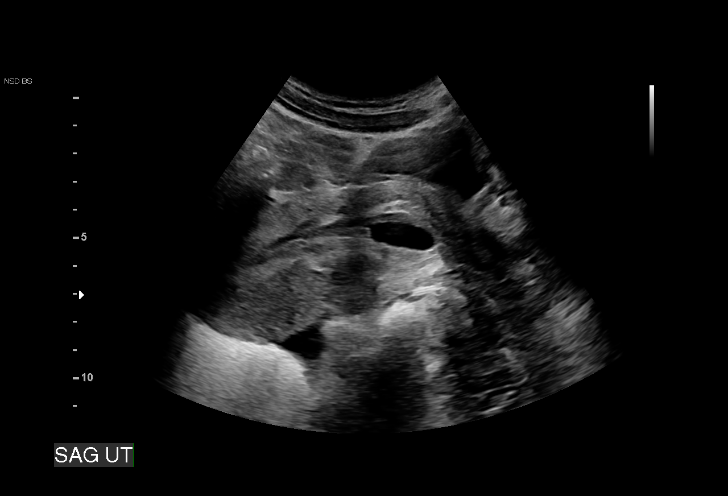
[im 2/17]
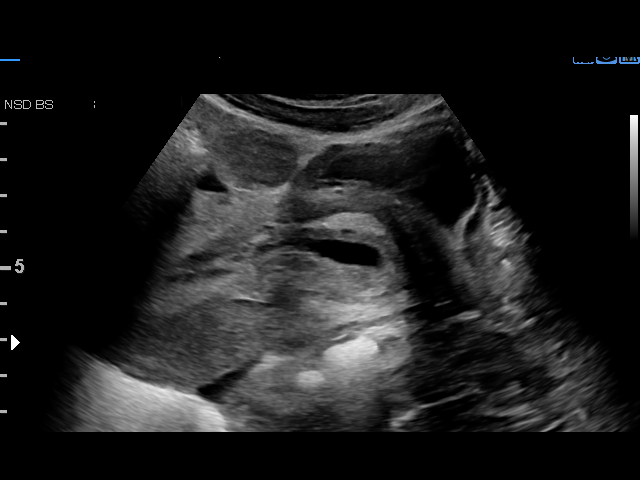
[im 3/17]
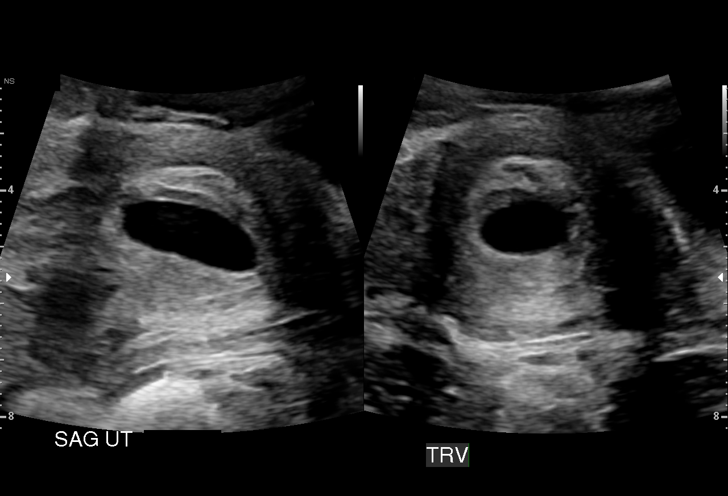
[im 4/17]
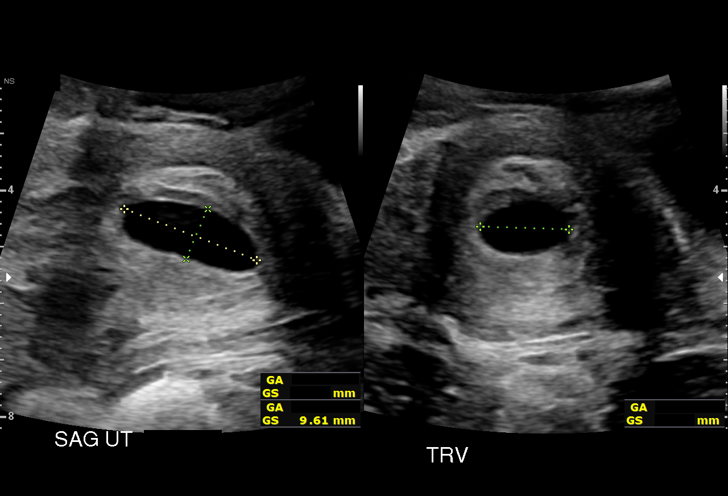
[im 6/17]
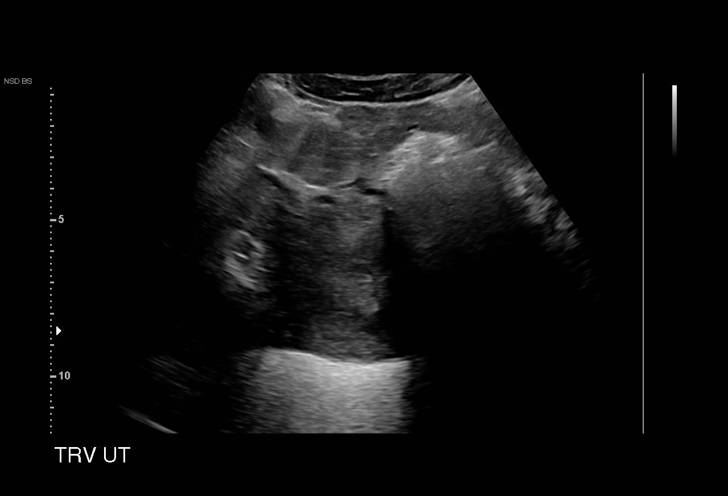
[im 7/17]
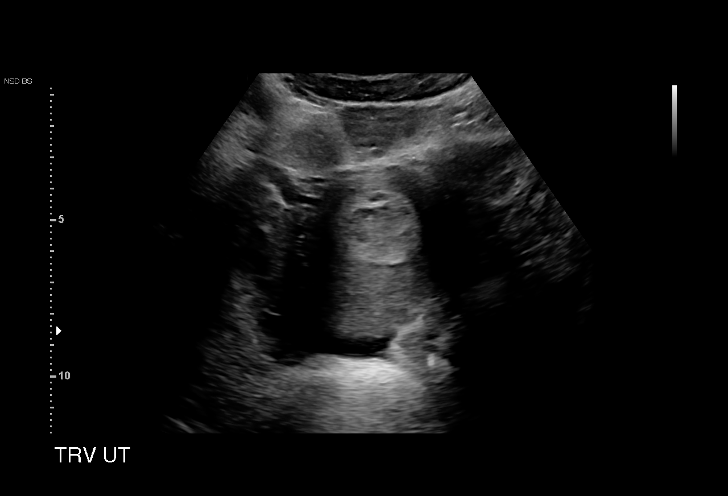
[im 8/17]
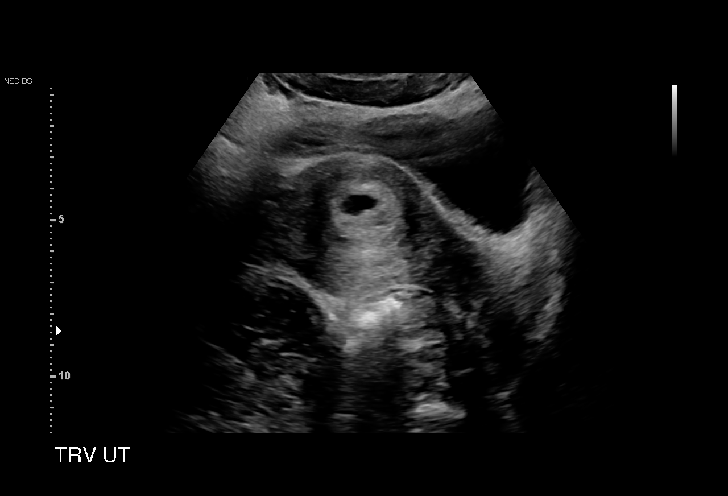
[im 9/17]
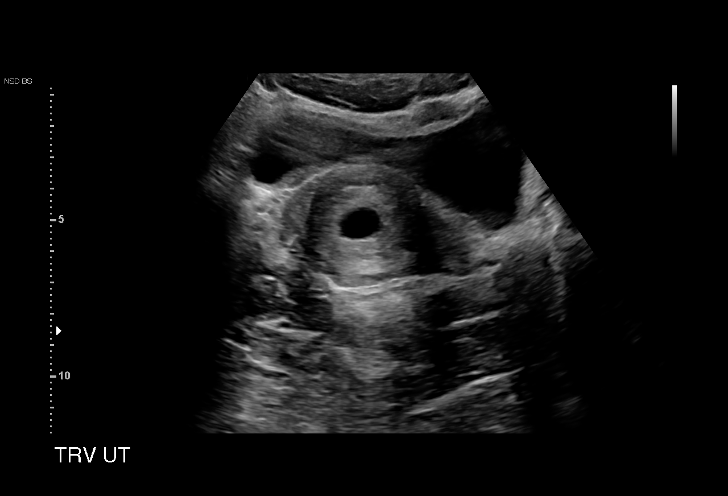
[im 10/17]
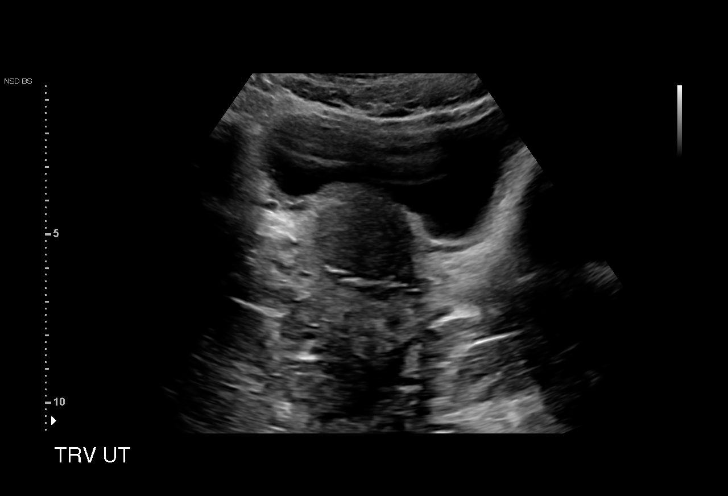
[im 11/17]
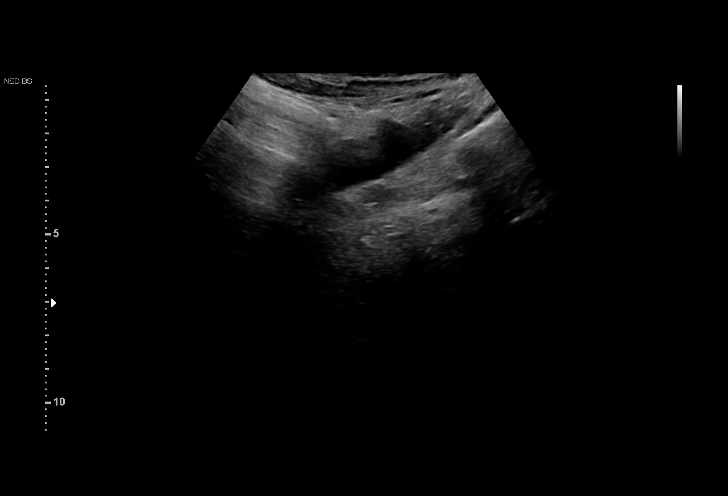
[im 12/17]
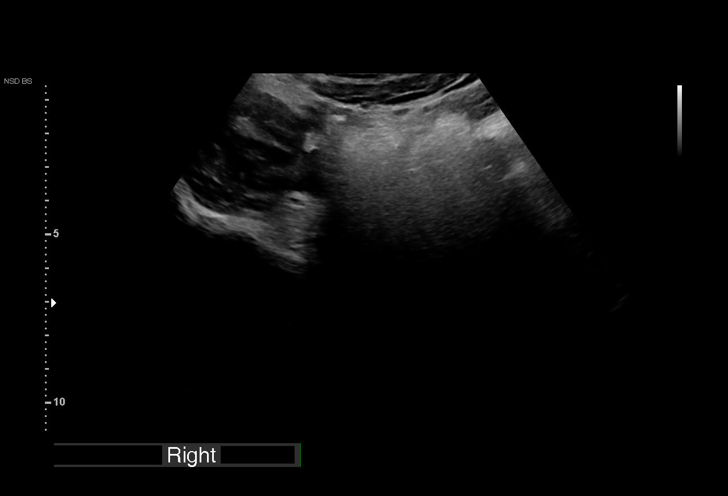
[im 14/17]
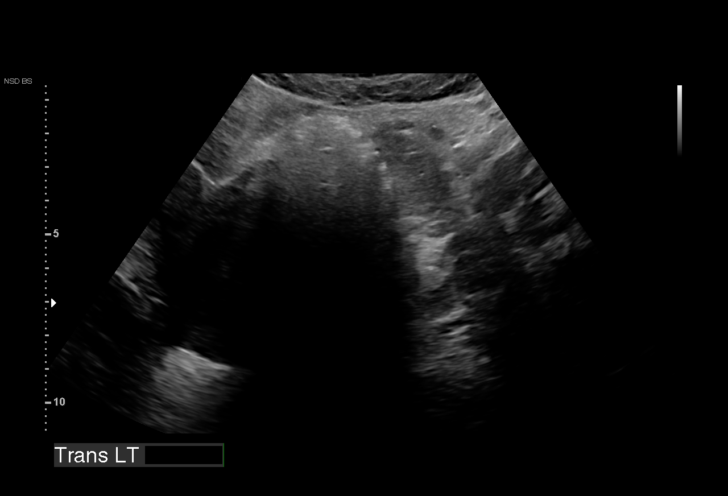
[im 15/17]
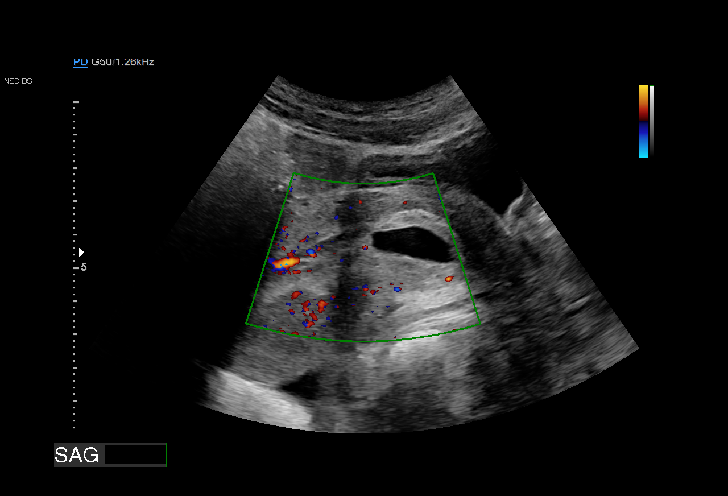
[im 16/17]
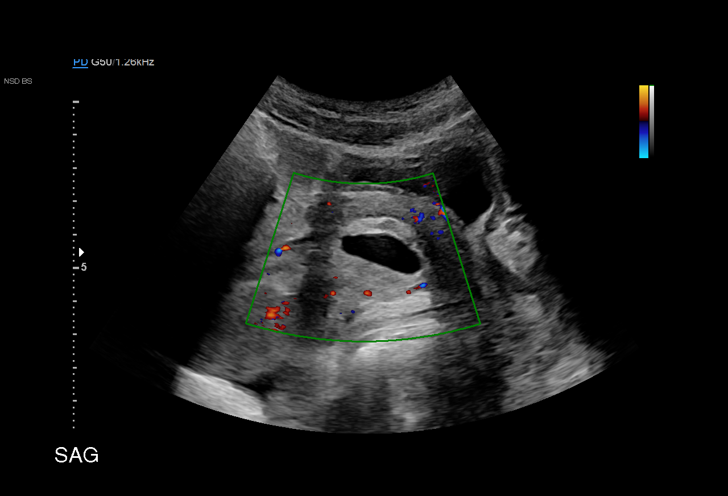
[im 17/17]
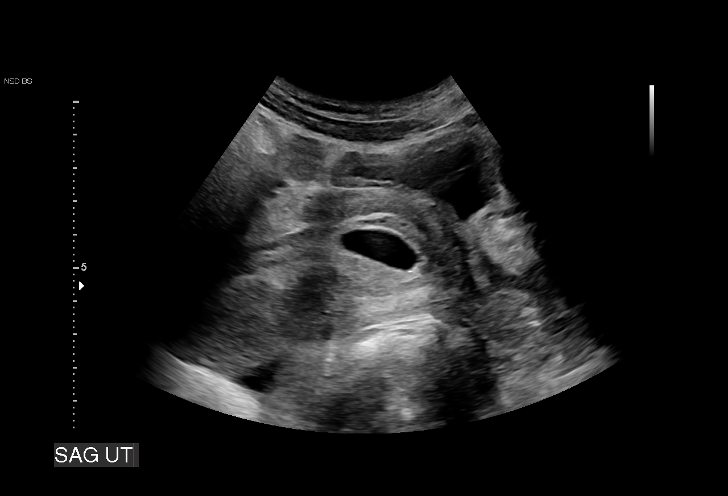

[15 of 17 positions shown; findings below may reference images not displayed]

FINDINGS: Intrauterine gestational sac: Present, now positioned within the
lower uterine segment/cervical region.

Yolk sac:  Not visualized.

Embryo:  Not visualized.

Cardiac Activity: Negative.

Heart Rate: N/A bpm

MSD:  16.8 mm   6 w   3 d

Subchorionic hemorrhage:  None visualized.

Maternal uterus/adnexae: Ovaries not visualized. No adnexal mass.
Small volume free fluid within the pelvis.
IMPRESSION: 1. Single intrauterine gestational sac, now low lying at the level
of the lower uterine segment/cervix. Previously seen yolk sac and
fetal pole no longer visualized. Findings meet definitive criteria
for failed pregnancy. This follows SRU consensus guidelines:
Diagnostic Criteria for Nonviable Pregnancy Early in the First
Trimester. N Engl J Med 7100;[DATE].
2. No other acute maternal uterine or adnexal abnormality
identified.

## 2021-05-22 DIAGNOSIS — H16142 Punctate keratitis, left eye: Secondary | ICD-10-CM | POA: Diagnosis not present

## 2021-06-04 ENCOUNTER — Emergency Department (HOSPITAL_COMMUNITY)
Admission: EM | Admit: 2021-06-04 | Discharge: 2021-06-04 | Disposition: A | Discharge status: Home / Self Care | Payer: BC Managed Care – PPO | Attending: Emergency Medicine | Admitting: Emergency Medicine

## 2021-06-04 DIAGNOSIS — R1110 Vomiting, unspecified: Secondary | ICD-10-CM | POA: Diagnosis not present

## 2021-06-04 DIAGNOSIS — R112 Nausea with vomiting, unspecified: Secondary | ICD-10-CM | POA: Diagnosis not present

## 2021-06-04 DIAGNOSIS — Z5321 Procedure and treatment not carried out due to patient leaving prior to being seen by health care provider: Secondary | ICD-10-CM | POA: Diagnosis not present

## 2021-06-04 DIAGNOSIS — R531 Weakness: Secondary | ICD-10-CM | POA: Insufficient documentation

## 2021-06-04 DIAGNOSIS — R197 Diarrhea, unspecified: Secondary | ICD-10-CM | POA: Diagnosis not present

## 2021-06-04 LAB — LIPASE, BLOOD: Lipase: 20 U/L (ref 11–51)

## 2021-06-04 LAB — CBC WITH DIFFERENTIAL/PLATELET
Abs Immature Granulocytes: 0.04 10*3/uL (ref 0.00–0.07)
Basophils Absolute: 0 10*3/uL (ref 0.0–0.1)
Basophils Relative: 0 %
Eosinophils Absolute: 0 10*3/uL (ref 0.0–0.5)
Eosinophils Relative: 0 %
HCT: 38.2 % (ref 36.0–46.0)
Hemoglobin: 12.5 g/dL (ref 12.0–15.0)
Immature Granulocytes: 0 %
Lymphocytes Relative: 12 %
Lymphs Abs: 1.2 10*3/uL (ref 0.7–4.0)
MCH: 30.8 pg (ref 26.0–34.0)
MCHC: 32.7 g/dL (ref 30.0–36.0)
MCV: 94.1 fL (ref 80.0–100.0)
Monocytes Absolute: 0.4 10*3/uL (ref 0.1–1.0)
Monocytes Relative: 4 %
Neutro Abs: 8.5 10*3/uL — ABNORMAL HIGH (ref 1.7–7.7)
Neutrophils Relative %: 84 %
Platelets: 301 10*3/uL (ref 150–400)
RBC: 4.06 MIL/uL (ref 3.87–5.11)
RDW: 13 % (ref 11.5–15.5)
WBC: 10.1 10*3/uL (ref 4.0–10.5)
nRBC: 0 % (ref 0.0–0.2)

## 2021-06-04 LAB — I-STAT BETA HCG BLOOD, ED (MC, WL, AP ONLY): I-stat hCG, quantitative: <5 m[IU]/mL (ref <5)

## 2021-06-04 LAB — COMPREHENSIVE METABOLIC PANEL
ALT: 21 U/L (ref 0–44)
AST: 36 U/L (ref 15–41)
Albumin: 4.6 g/dL (ref 3.5–5.0)
Alkaline Phosphatase: 55 U/L (ref 38–126)
Anion gap: 13 (ref 5–15)
BUN: 8 mg/dL (ref 6–20)
CO2: 21 mmol/L — ABNORMAL LOW (ref 22–32)
Calcium: 9.3 mg/dL (ref 8.9–10.3)
Chloride: 107 mmol/L (ref 98–111)
Creatinine, Ser: 0.86 mg/dL (ref 0.44–1.00)
GFR, Estimated: >60 mL/min (ref >60)
Glucose, Bld: 87 mg/dL (ref 70–99)
Potassium: 3.2 mmol/L — ABNORMAL LOW (ref 3.5–5.1)
Sodium: 141 mmol/L (ref 135–145)
Total Bilirubin: 0.6 mg/dL (ref 0.3–1.2)
Total Protein: 8 g/dL (ref 6.5–8.1)

## 2021-06-04 MED ORDER — ONDANSETRON 4 MG PO TBDP
4.0000 mg | ORAL_TABLET | Freq: Once | ORAL | Status: AC
  Administered 2021-06-04: 4 mg via ORAL
  Filled 2021-06-04: qty 1

## 2021-06-04 NOTE — ED Triage Notes (Signed)
Pt reports weakness since vomiting approx 10 times this morning. States she drank last night. PT currently awake, alert, NAD at present. VSS.

## 2021-06-04 NOTE — ED Notes (Signed)
Called pt numerous times, with no response. Registration has also called pt with numerous times with no response

## 2021-06-04 NOTE — ED Provider Notes (Signed)
Emergency Medicine Provider Triage Evaluation Note  Sydney Woods , a 20 y.o. female  was evaluated in triage.  Pt complains of nausea, vomiting, and diarrhea.  Patient reports symptoms started earlier this morning.  Has been persistent since then.  Denies any hematemesis or coffee-ground emesis.  Patient reports that she went out drinking last night.  Patient also endorses marijuana use.  Patient reports that her LMP stopped 3 days prior.  Review of Systems  Positive: Nausea, vomiting, diarrhea Negative: Abdominal pain, blood in stool, melena, vaginal pain, vaginal bleeding, vaginal discharge,  Physical Exam  BP 111/72 (BP Location: Right Arm)   Pulse 75   Temp 98.3 F (36.8 C) (Oral)   Resp 15   SpO2 100%  Gen:   Awake, no distress   Resp:  Normal effort  MSK:   Moves extremities without difficulty  Other:  Abdomen soft, nondistended, mild generalized tenderness to palpation.  No peritoneal signs.  Medical Decision Making  Medically screening exam initiated at 2:42 PM.  Appropriate orders placed.  Sydney Woods was informed that the remainder of the evaluation will be completed by another provider, this initial triage assessment does not replace that evaluation, and the importance of remaining in the persistent nausea until their evaluation is complete.  The patient appears stable so that the remainder of the work up may be completed by another provider.      Sydney Woods 06/04/21 1444    Gerhard Munch, MD 06/06/21 Sydney Woods

## 2021-10-18 IMAGING — US US OB < 14 WEEKS - US OB TV
1 series · 15 of 28 positions shown · non-contrast
Comparison: None.

CLINICAL DATA: Possible pregnancy, dating

EXAM:
OBSTETRIC <14 WK US AND TRANSVAGINAL OB US
TECHNIQUE: Both transabdominal and transvaginal ultrasound examinations were
performed for complete evaluation of the gestation as well as the
maternal uterus, adnexal regions, and pelvic cul-de-sac.
Transvaginal technique was performed to assess early pregnancy.

[Series 1: us ob < 14 weeks - us ob tv · 15 of 86 slices shown]
[im 1/86]
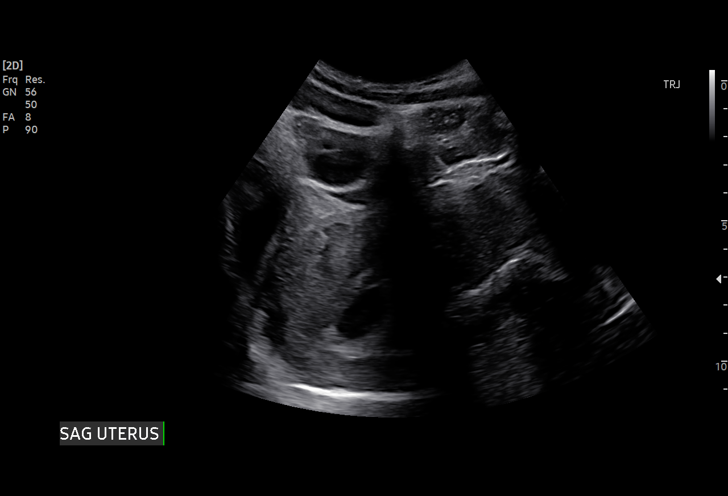
[im 7/86]
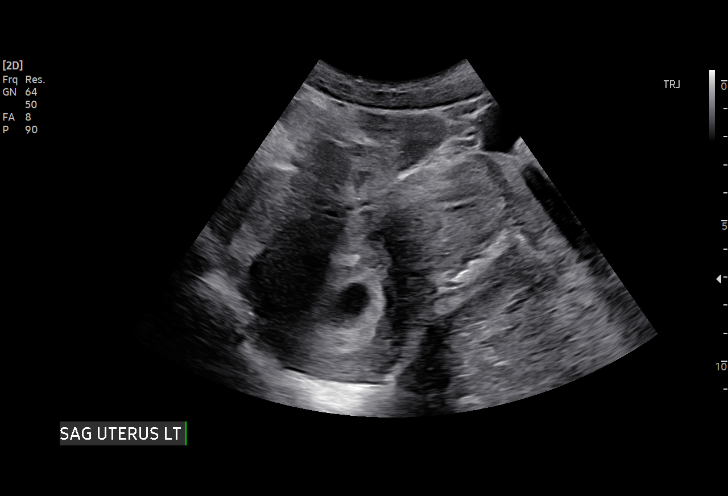
[im 13/86]
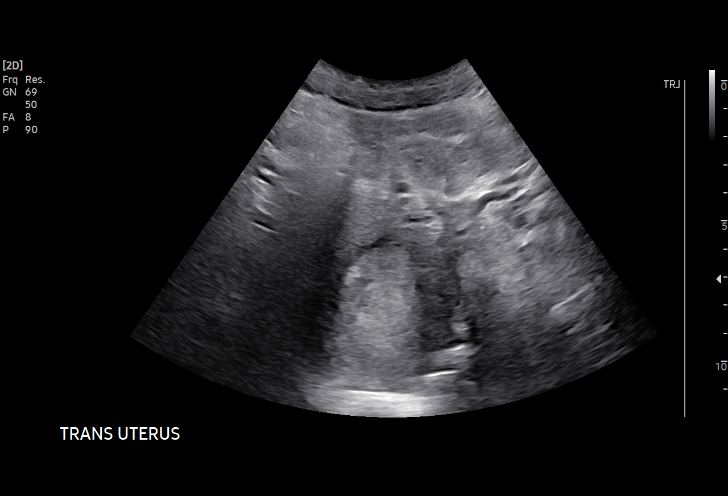
[im 19/86]
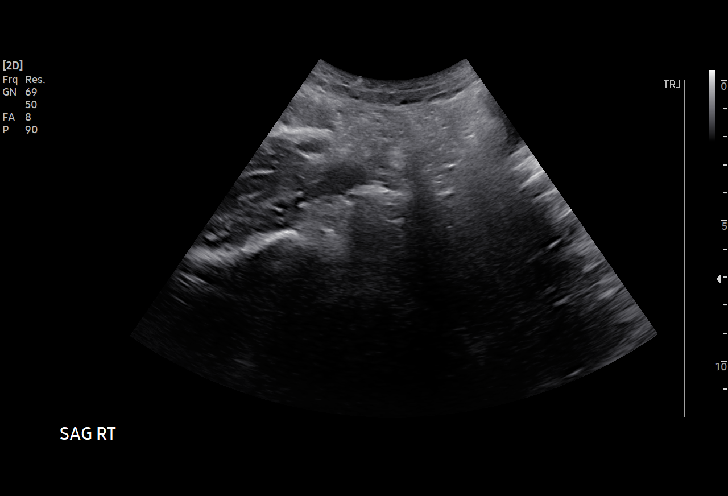
[im 26/86]
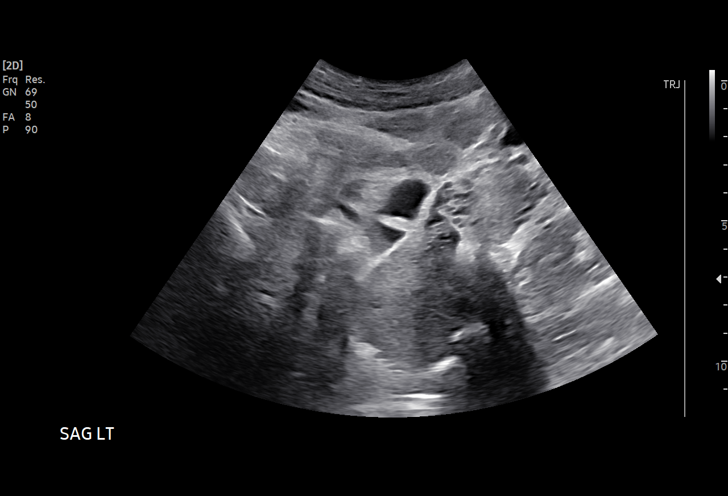
[im 32/86]
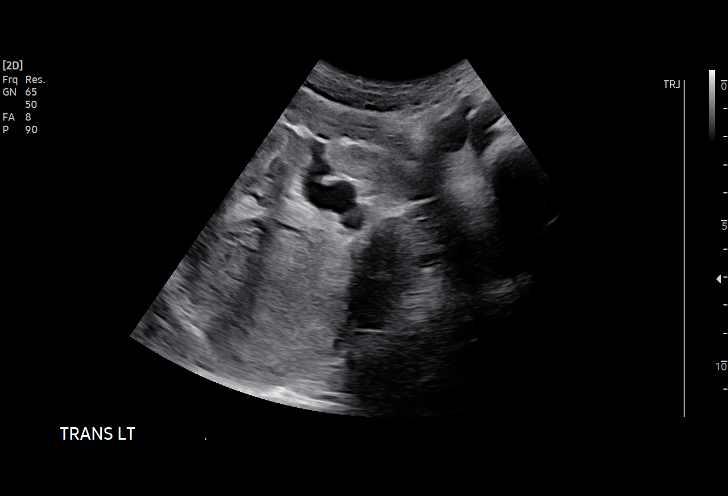
[im 38/86]
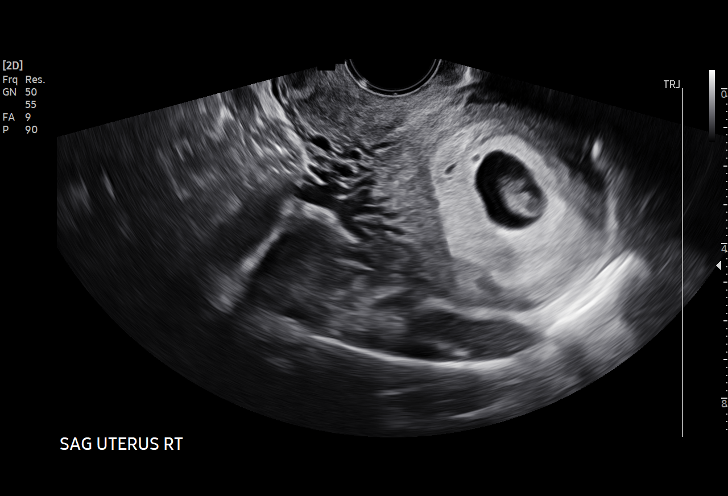
[im 45/86]
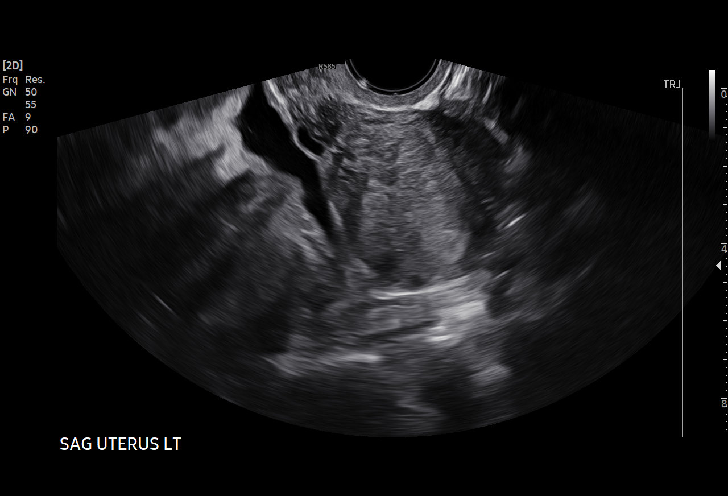
[im 48/86]
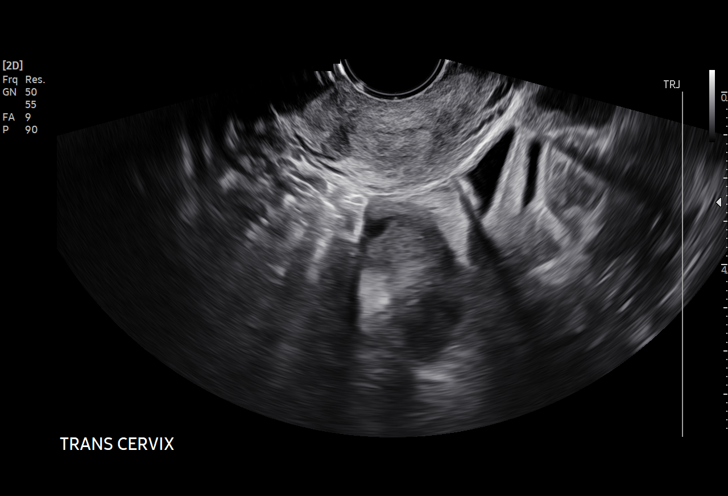
[im 54/86]
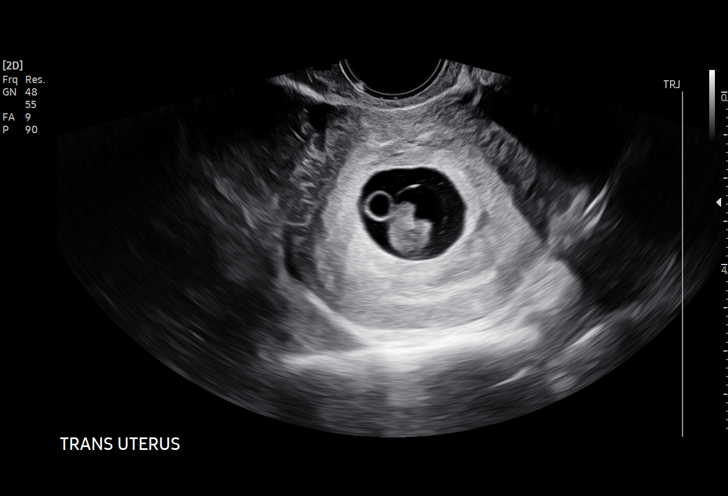
[im 60/86]
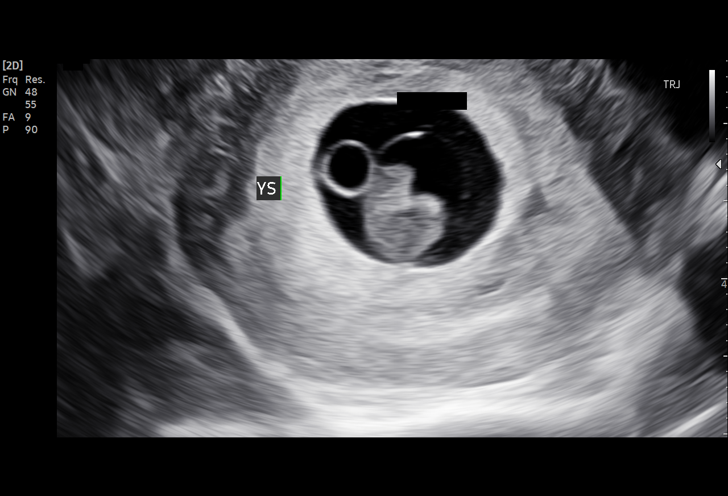
[im 67/86]
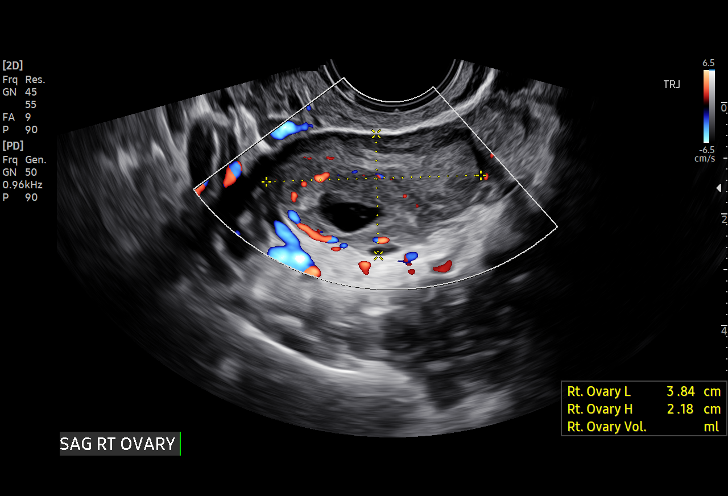
[im 73/86]
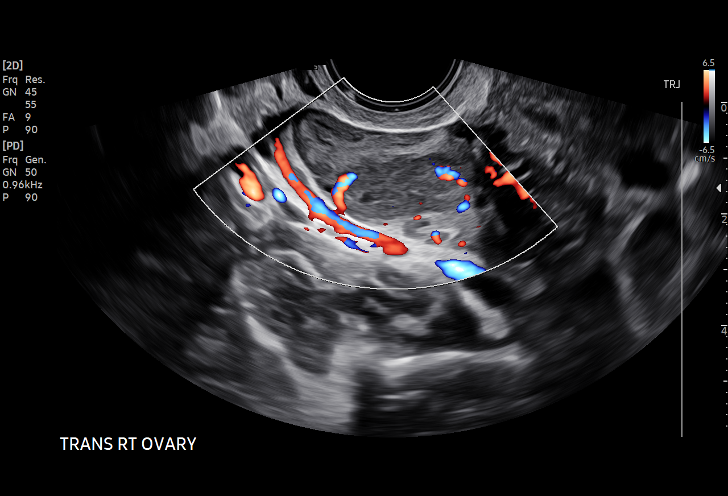
[im 79/86]
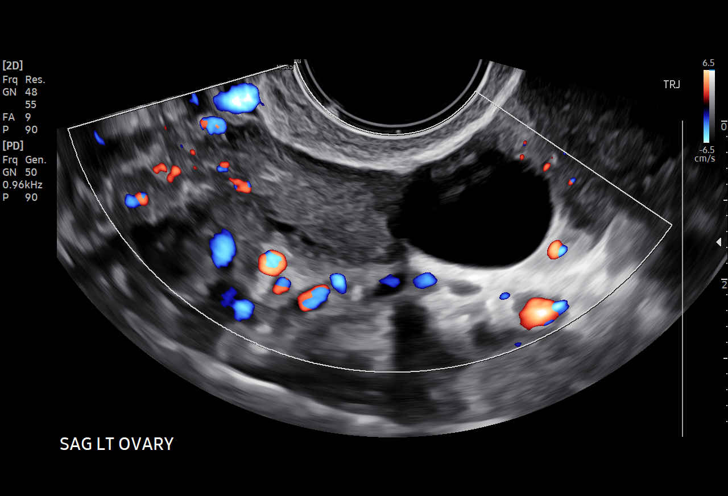
[im 86/86]
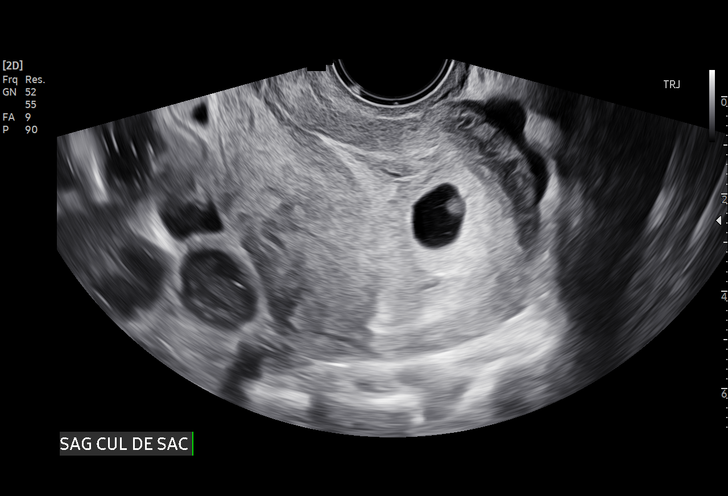

[15 of 28 positions shown; findings below may reference images not displayed]

FINDINGS: Intrauterine gestational sac: Single

Yolk sac:  Visualized.

Embryo:  Visualized.

Cardiac Activity: Not Visualized.

CRL: 14 mm   7 w   5 d                  US EDC: 04/30/2021

Subchorionic hemorrhage:  None visualized.

Maternal uterus/adnexae: Trace free fluid.
IMPRESSION: Findings meet definitive criteria for failed pregnancy. This follows
SRU consensus guidelines: Diagnostic Criteria for Nonviable
Pregnancy Early in the First Trimester. N Engl J Med

## 2021-11-14 ENCOUNTER — Encounter: Payer: Self-pay | Admitting: Internal Medicine

## 2021-11-14 ENCOUNTER — Other Ambulatory Visit: Payer: Self-pay

## 2021-11-14 ENCOUNTER — Ambulatory Visit (INDEPENDENT_AMBULATORY_CARE_PROVIDER_SITE_OTHER): Payer: Medicaid Other | Admitting: Internal Medicine

## 2021-11-14 DIAGNOSIS — H9190 Unspecified hearing loss, unspecified ear: Secondary | ICD-10-CM | POA: Insufficient documentation

## 2021-11-14 DIAGNOSIS — J029 Acute pharyngitis, unspecified: Secondary | ICD-10-CM | POA: Diagnosis not present

## 2021-11-14 DIAGNOSIS — H9191 Unspecified hearing loss, right ear: Secondary | ICD-10-CM

## 2021-11-14 DIAGNOSIS — H6123 Impacted cerumen, bilateral: Secondary | ICD-10-CM | POA: Insufficient documentation

## 2021-11-14 HISTORY — DX: Unspecified hearing loss, unspecified ear: H91.90

## 2021-11-14 NOTE — Assessment & Plan Note (Signed)
Acute ?Started today ?Exam normal ?No other cold symptoms ?Advised symptomatic treatment ?

## 2021-11-14 NOTE — Progress Notes (Signed)
? ? ?Subjective:  ? ? Patient ID: Sydney Woods, female    DOB: June 22, 2001, 21 y.o.   MRN: [id number] ? ?This visit occurred during the SARS-CoV-2 public health emergency.  Safety protocols were in place, including screening questions prior to the visit, additional usage of staff PPE, and extensive cleaning of exam room while observing appropriate contact time as indicated for disinfecting solutions. ? ? ? ?HPI ?Apphia is here for  ?Chief Complaint  ?Patient presents with  ? Ear Pain  ?  Right ear fullness and previous pain  ? ? ?Approximately 1 month that she has had intermittent hearing loss in the right ear.  She would notice it most at night when she went to lay down.  Sometimes she could manipulate the external ear and she would be able to hear again.  She had pain in the ear a week ago, but that went away and denies any pain now.  She has not had any discharge.  Over the last week at some point. ?Went away completely and she cannot hear out of that ear now. ? ?This morning she started having a sore throat, but otherwise denies any cold symptoms.  She does not use Q-tips. ? ? ?Medications and allergies reviewed with patient and updated if appropriate. ? ?Current Outpatient Medications on File Prior to Visit  ?Medication Sig Dispense Refill  ? aspirin EC 81 MG tablet Take 1 tablet (81 mg total) by mouth daily. Swallow whole. 30 tablet 11  ? ondansetron (ZOFRAN ODT) 4 MG disintegrating tablet Take 1 tablet (4 mg total) by mouth every 8 (eight) hours as needed for nausea or vomiting. 21 tablet 0  ? Prenatal Vit-Fe Fumarate-FA (PRENATAL VITAMIN PO) Take by mouth.    ? ?No current facility-administered medications on file prior to visit.  ? ? ?Review of Systems  ?Constitutional:  Negative for chills and fever.  ?HENT:  Positive for sore throat. Negative for congestion, ear pain and postnasal drip.   ?Respiratory:  Negative for cough, shortness of breath and wheezing.   ?Neurological:  Positive for headaches (occ).  Negative for light-headedness.  ? ?   ?Objective:  ? ?Vitals:  ? 11/14/21 1105  ?BP: 110/78  ?Pulse: 70  ?Resp: 16  ?Temp: 97.7 ?F (36.5 ?C)  ? ?BP Readings from Last 3 Encounters:  ?11/14/21 110/78  ?06/04/21 111/72  ?11/10/20 120/61  ? ?Wt Readings from Last 3 Encounters:  ?11/14/21 126 lb 12.8 oz (57.5 kg)  ?06/04/21 120 lb (54.4 kg)  ?09/24/20 125 lb (56.7 kg)  ? ?Body mass index is 19.28 kg/m?. ? ?  ?Physical Exam ?Constitutional:   ?   General: She is not in acute distress. ?   Appearance: Normal appearance. She is not ill-appearing.  ?HENT:  ?   Head: Normocephalic and atraumatic.  ?   Right Ear: Ear canal and external ear normal. There is impacted cerumen.  ?   Left Ear: Ear canal and external ear normal. There is no impacted cerumen (moderate cerumen - paritally visualized TM is normal).  ?   Ears:  ?   Comments: Decreased hearing right ear ?   Mouth/Throat:  ?   Mouth: Mucous membranes are moist.  ?   Pharynx: No oropharyngeal exudate or posterior oropharyngeal erythema.  ?Eyes:  ?   Conjunctiva/sclera: Conjunctivae normal.  ?Cardiovascular:  ?   Rate and Rhythm: Normal rate and regular rhythm.  ?Musculoskeletal:  ?   Cervical back: Neck supple. No tenderness.  ?Lymphadenopathy:  ?  Cervical: No cervical adenopathy.  ?Skin: ?   General: Skin is warm and dry.  ?Neurological:  ?   Mental Status: She is alert.  ? ?   ? ?PRE-PROCEDURE EXAM: As above ?PROCEDURE INDICATION: remove wax to visualize ear drum & improve hearing in the right ear.  Also to remove wax to prevent issues with the left ear ?CONSENT:  Verbal ? ?PROCEDURE NOTE:  ? ?RIGHT EAR:  The CMA  successfully irrigated the right ear canal with warm water to remove the wax.  CMA also irrigated the left ear with warm water to remove the excess wax in the left ear canal. ? ?I reexamined the ears after the procedure. ?POST- PROCEDURE EXAM: Bilateral TMs successfully visualized and found to have no erythema or perforation.  Ear canals bilaterally free of  wax. ? ?She tolerated the procedure well.  Her hearing was back to normal. ? ? ? ? ?Assessment & Plan:  ? ? ?See Problem List for Assessment and Plan of chronic medical problems.  ? ? ? ? ?

## 2021-11-14 NOTE — Addendum Note (Signed)
Addended by: Delsa Grana R on: 11/14/2021 02:38 PM ? ? Modules accepted: Orders ? ?

## 2021-11-14 NOTE — Patient Instructions (Signed)
? ? ? ?Your ears were cleaned out successfully. ? ? ?Use the nasal spray nightly ? ? ? ?Earwax Buildup, Adult ?The ears produce a substance called earwax that helps keep bacteria out of the ear and protects the skin in the ear canal. Occasionally, earwax can build up in the ear and cause discomfort or hearing loss. ?What are the causes? ?This condition is caused by a buildup of earwax. Ear canals are self-cleaning. Ear wax is made in the outer part of the ear canal and generally falls out in small amounts over time. ?When the self-cleaning mechanism is not working, earwax builds up and can cause decreased hearing and discomfort. Attempting to clean ears with cotton swabs can push the earwax deep into the ear canal and cause decreased hearing and pain. ?What increases the risk? ?This condition is more likely to develop in people who: ?Clean their ears often with cotton swabs. ?Pick at their ears. ?Use earplugs or in-ear headphones often, or wear hearing aids. ?The following factors may also make you more likely to develop this condition: ?Being female. ?Being of older age. ?Naturally producing more earwax. ?Having narrow ear canals. ?Having earwax that is overly thick or sticky. ?Having excess hair in the ear canal. ?Having eczema. ?Being dehydrated. ?What are the signs or symptoms? ?Symptoms of this condition include: ?Reduced or muffled hearing. ?A feeling of fullness in the ear or feeling that the ear is plugged. ?Fluid coming from the ear. ?Ear pain or an itchy ear. ?Ringing in the ear. ?Coughing. ?Balance problems. ?An obvious piece of earwax that can be seen inside the ear canal. ?How is this diagnosed? ?This condition may be diagnosed based on: ?Your symptoms. ?Your medical history. ?An ear exam. During the exam, your health care provider will look into your ear with an instrument called an otoscope. ?You may have tests, including a hearing test. ?How is this treated? ?This condition may be treated by: ?Using  ear drops to soften the earwax. ?Having the earwax removed by a health care provider. The health care provider may: ?Flush the ear with water. ?Use an instrument that has a loop on the end (curette). ?Use a suction device. ?Having surgery to remove the wax buildup. This may be done in severe cases. ?Follow these instructions at home: ? ?Take over-the-counter and prescription medicines only as told by your health care provider. ?Do not put any objects, including cotton swabs, into your ear. You can clean the opening of your ear canal with a washcloth or facial tissue. ?Follow instructions from your health care provider about cleaning your ears. Do not overclean your ears. ?Drink enough fluid to keep your urine pale yellow. This will help to thin the earwax. ?Keep all follow-up visits as told. If earwax builds up in your ears often or if you use hearing aids, consider seeing your health care provider for routine, preventive ear cleanings. Ask your health care provider how often you should schedule your cleanings. ?If you have hearing aids, clean them according to instructions from the manufacturer and your health care provider. ?Contact a health care provider if: ?You have ear pain. ?You develop a fever. ?You have pus or other fluid coming from your ear. ?You have hearing loss. ?You have ringing in your ears that does not go away. ?You feel like the room is spinning (vertigo). ?Your symptoms do not improve with treatment. ?Get help right away if: ?You have bleeding from the affected ear. ?You have severe ear pain. ?Summary ?Earwax can  build up in the ear and cause discomfort or hearing loss. ?The most common symptoms of this condition include reduced or muffled hearing, a feeling of fullness in the ear, or feeling that the ear is plugged. ?This condition may be diagnosed based on your symptoms, your medical history, and an ear exam. ?This condition may be treated by using ear drops to soften the earwax or by having  the earwax removed by a health care provider. ?Do not put any objects, including cotton swabs, into your ear. You can clean the opening of your ear canal with a washcloth or facial tissue. ?This information is not intended to replace advice given to you by your health care provider. Make sure you discuss any questions you have with your health care provider. ?Document Revised: 12/19/2019 Document Reviewed: 12/19/2019 ?Elsevier Patient Education ? [employer]. ? ?

## 2021-11-14 NOTE — Assessment & Plan Note (Signed)
Acute ?Hearing loss in right ear that was intermittent over the last month, but worse in the last week.  She did have some associated pain at 1 point, but that has resolved ?Significant cerumen in ear canal was causing the decreased hearing ?Ear canal was successfully irrigated with warm water and hearing returned to normal ? ?

## 2021-11-14 NOTE — Assessment & Plan Note (Addendum)
Acute ?Excessive cerumen/impaction because of her hearing loss ?Ears successfully irrigated and wax was removed resulting in her hearing returning to normal ?Left ear canal also irrigated due to excessive wax ? ?Discussed that she can try using a nasal spray at night such as Flonase which may help prevent some of the wax buildup ?

## 2023-01-18 ENCOUNTER — Inpatient Hospital Stay (HOSPITAL_COMMUNITY)
Admission: AD | Admit: 2023-01-18 | Discharge: 2023-01-18 | Disposition: A | Discharge status: Home / Self Care | Payer: Medicaid Other | Attending: Obstetrics and Gynecology | Admitting: Obstetrics and Gynecology

## 2023-01-18 DIAGNOSIS — Z049 Encounter for examination and observation for unspecified reason: Secondary | ICD-10-CM | POA: Diagnosis present

## 2023-01-18 DIAGNOSIS — Z3200 Encounter for pregnancy test, result unknown: Secondary | ICD-10-CM

## 2023-01-18 NOTE — Discharge Instructions (Signed)
[facility]   [facility]             [address], [place], [place] [address] [phone]  [facility]                                                             [address], [address], [place], [place], [address] [phone]  [facility]                                    [address], [address], [place], [place], [address] [phone]  [facility] [place] [address], [address], [place], [place], [address] [phone]  [facility]                                 [address], [place], [place], [address] [phone]  [facility]                                    [address], [place], [place], [address] [phone]  [facility] [address], [address], [place], [place], [address]                              [facility] [address], [address], [place], , [address] [phone]  [facility]         Phone: [phone]  [facility]      Phone: [phone]   [facility]      Phone: [phone]  [facility]         Phone: [phone]   [facility]    Phone: [phone]  [facility]      Phone: [phone]  [facility]     Phone: [phone]  [facility]        Phone: [phone]  [facility]      Phone: [phone]  

## 2023-01-18 NOTE — MAU Note (Signed)
Sydney Woods is a 22 y.o. at Unknown here in MAU reporting: not in any pain or anything. No bleeding.  Was 5/6 days late. Took 2 preg tests over the weekend that were both positive. 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  Wanting to know if we can do blood work, make sure everything is ok.   Is looking for a dr.  LMP: 3/30 Onset of complaint: wk late Pain score: no Vitals:   01/18/23 1537  BP: 111/72  Pulse: 76  Resp: 16  Temp: 98.1 F (36.7 C)  SpO2: 100%     Lab orders placed from triage:

## 2023-01-18 NOTE — MAU Provider Note (Signed)
None    S [name] is a 22 y.o. [id number] patient who presents to MAU today for pregnancy confirmation. She reports she had 2 positive pregnancy tests on Saturday. She is not having any pain or bleeding. No discharge or urinary s/s. 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 She is also requesting on HROB practices in the area.   O BP 111/72 (BP Location: Right Arm)   Pulse 76   Temp 98.1 F (36.7 C) (Oral)   Resp 16   Ht 5\' 8"  (1.727 m)   Wt 55.2 kg   LMP 12/12/2022   SpO2 100%   BMI 18.52 kg/m   Physical Exam Vitals and nursing note reviewed.  Constitutional:      General: She is not in acute distress. Eyes:     Extraocular Movements: Extraocular movements intact.     Pupils: Pupils are equal, round, and reactive to light.  Cardiovascular:     Rate and Rhythm: Normal rate.  Pulmonary:     Effort: Pulmonary effort is normal. No respiratory distress.  Musculoskeletal:     Cervical back: Normal range of motion.  Neurological:     General: No focal deficit present.     Mental Status: She is alert and oriented to person, place, and time.  Psychiatric:        Mood and Affect: Mood normal.        Behavior: Behavior normal.     A Medical screening exam complete 1. Possible pregnancy, not confirmed     P Discharge from MAU in stable condition Informed patient and her mother that MAU does not confirm pregnancies OBGYN list provided Can schedule appointment for pregnancy confirmation Warning signs for worsening condition that would warrant emergency follow-up discussed Patient may return to MAU as needed for [facility] emergencies   Brand Males, CNM 01/18/2023 4:02 PM

## 2023-01-20 ENCOUNTER — Inpatient Hospital Stay (HOSPITAL_COMMUNITY)
Admission: AD | Admit: 2023-01-20 | Discharge: 2023-01-20 | Disposition: A | Discharge status: Home / Self Care | Payer: Medicaid Other | Attending: Obstetrics and Gynecology | Admitting: Obstetrics and Gynecology

## 2023-01-20 ENCOUNTER — Inpatient Hospital Stay (HOSPITAL_COMMUNITY): Payer: Medicaid Other

## 2023-01-20 ENCOUNTER — Encounter (HOSPITAL_COMMUNITY): Payer: Self-pay | Admitting: *Deleted

## 2023-01-20 DIAGNOSIS — O208 Other hemorrhage in early pregnancy: Secondary | ICD-10-CM

## 2023-01-20 DIAGNOSIS — Z3687 Encounter for antenatal screening for uncertain dates: Secondary | ICD-10-CM | POA: Insufficient documentation

## 2023-01-20 DIAGNOSIS — N83202 Unspecified ovarian cyst, left side: Secondary | ICD-10-CM | POA: Insufficient documentation

## 2023-01-20 DIAGNOSIS — Z3A01 Less than 8 weeks gestation of pregnancy: Secondary | ICD-10-CM

## 2023-01-20 DIAGNOSIS — O209 Hemorrhage in early pregnancy, unspecified: Secondary | ICD-10-CM

## 2023-01-20 DIAGNOSIS — Z6740 Type O blood, Rh positive: Secondary | ICD-10-CM

## 2023-01-20 DIAGNOSIS — Z3490 Encounter for supervision of normal pregnancy, unspecified, unspecified trimester: Secondary | ICD-10-CM

## 2023-01-20 DIAGNOSIS — O3481 Maternal care for other abnormalities of pelvic organs, first trimester: Secondary | ICD-10-CM | POA: Insufficient documentation

## 2023-01-20 HISTORY — DX: Depression, unspecified: F32.A

## 2023-01-20 LAB — WET PREP, GENITAL
Clue Cells Wet Prep HPF POC: NONE SEEN
Sperm: NONE SEEN
Trich, Wet Prep: NONE SEEN
WBC, Wet Prep HPF POC: <10 (ref <10)
Yeast Wet Prep HPF POC: NONE SEEN

## 2023-01-20 LAB — CBC
HCT: 38.5 % (ref 36.0–46.0)
Hemoglobin: 12.8 g/dL (ref 12.0–15.0)
MCH: 30.8 pg (ref 26.0–34.0)
MCHC: 33.2 g/dL (ref 30.0–36.0)
MCV: 92.5 fL (ref 80.0–100.0)
Platelets: 265 10*3/uL (ref 150–400)
RBC: 4.16 MIL/uL (ref 3.87–5.11)
RDW: 11.6 % (ref 11.5–15.5)
WBC: 5.1 10*3/uL (ref 4.0–10.5)
nRBC: 0 % (ref 0.0–0.2)

## 2023-01-20 LAB — POCT PREGNANCY, URINE: Preg Test, Ur: POSITIVE — AB

## 2023-01-20 LAB — HCG, QUANTITATIVE, PREGNANCY: hCG, Beta Chain, Quant, S: 5498 m[IU]/mL — ABNORMAL HIGH (ref <5)

## 2023-01-20 NOTE — MAU Provider Note (Signed)
History     CSN: [id number]  Arrival date and time: 01/20/23 1338   Event Date/Time   First Provider Initiated Contact with Patient 01/20/23 1515      Chief Complaint  Patient presents with   Vaginal Bleeding   Sydney Woods is a 22 y.o. G4P0030 at [redacted]w[redacted]d who presents today with vaginal bleeding. She denies any pain.   Vaginal Bleeding The patient's primary symptoms include vaginal bleeding. This is a new problem. The current episode started today. The problem occurs constantly. The problem has been unchanged. The patient is experiencing no pain. She is pregnant. The vaginal discharge was bloody. The vaginal bleeding is typical of menses. She has not been passing clots. She has not been passing tissue. Nothing aggravates the symptoms. She has tried nothing for the symptoms. Her menstrual history has been regular (LMP 12/12/2022).    OB History     Gravida  4   Para  0   Term  0   Preterm  0   AB  3   Living  0      SAB  3   IAB  0   Ectopic  0   Multiple  0   Live Births  0           Past Medical History:  Diagnosis Date   Anxiety    Chlamydia    Depression    Dyspnea    SOB WHEN GETS NERVOUS FOR 4 DAYS LAST TIME 08-08-2019   PID (pelvic inflammatory disease)    Syncope    NONE RECENT    Past Surgical History:  Procedure Laterality Date   DILATION AND EVACUATION N/A 08/09/2019   Procedure: DILATATION AND EVACUATION;  Surgeon: Patton Salles, MD;  Location: [facility];  Service: Gynecology;  Laterality: N/A;    Family History  Problem Relation Age of Onset   Cancer Mother        pancreatic   Pancreatitis Mother    Heart disease Mother    Pancreatic cancer Mother    Cancer Father        leukemia   Diabetes Father    Leukemia Father    Healthy Brother    Alzheimer's disease Maternal Grandmother     Social History   Tobacco Use   Smoking status: Never   Smokeless tobacco: Never  Vaping Use   Vaping Use:  Never used  Substance Use Topics   Alcohol use: Not Currently    Comment: last was a month ago   Drug use: Not Currently    Types: Marijuana    Comment: last was 2 wks ago    Allergies:  Allergies  Allergen Reactions   Tomato Flavor [Flavoring Agent] Other (See Comments)    Bumps on tongue   Citrus Other (See Comments)    Bumps on tongue     Medications Prior to Admission  Medication Sig Dispense Refill Last Dose   Prenatal Vit-Fe Fumarate-FA (PRENATAL VITAMIN PO) Take by mouth.   01/19/2023   aspirin EC 81 MG tablet Take 1 tablet (81 mg total) by mouth daily. Swallow whole. 30 tablet 11    ondansetron (ZOFRAN ODT) 4 MG disintegrating tablet Take 1 tablet (4 mg total) by mouth every 8 (eight) hours as needed for nausea or vomiting. 21 tablet 0     Review of Systems  Genitourinary:  Positive for vaginal bleeding.  All other systems reviewed and are negative.  Physical Exam  Blood pressure 121/76, pulse 80, temperature (!) 97.3 F (36.3 C), temperature source Oral, resp. rate 18, height 5\' 8"  (1.727 m), weight 55 kg, last menstrual period 12/12/2022, SpO2 98 %.  Physical Exam Constitutional:      Appearance: She is well-developed.  HENT:     Head: Normocephalic.  Eyes:     Pupils: Pupils are equal, round, and reactive to light.  Cardiovascular:     Rate and Rhythm: Normal rate and regular rhythm.     Heart sounds: Normal heart sounds.  Pulmonary:     Effort: Pulmonary effort is normal. No respiratory distress.     Breath sounds: Normal breath sounds.  Abdominal:     Palpations: Abdomen is soft.     Tenderness: There is no abdominal tenderness.  Genitourinary:    Vagina: No bleeding. Vaginal discharge: mucusy.    Comments: External: no lesion Vagina: small amount of brown blood seen  Cervix: closed, no active bleeding noted, no CMT  Uterus: NSSC     Musculoskeletal:        General: Normal range of motion.     Cervical back: Normal range of motion and neck  supple.  Skin:    General: Skin is warm and dry.  Neurological:     Mental Status: She is alert and oriented to person, place, and time.  Psychiatric:        Mood and Affect: Mood normal.        Behavior: Behavior normal.    Results for orders placed or performed during the hospital encounter of 01/20/23 (from the past 24 hour(s))  Pregnancy, urine POC     Status: Abnormal   Collection Time: 01/20/23  2:27 PM  Result Value Ref Range   Preg Test, Ur POSITIVE (A) NEGATIVE  Wet prep, genital     Status: None   Collection Time: 01/20/23  3:25 PM   Specimen: Vaginal  Result Value Ref Range   Yeast Wet Prep HPF POC NONE SEEN NONE SEEN   Trich, Wet Prep NONE SEEN NONE SEEN   Clue Cells Wet Prep HPF POC NONE SEEN NONE SEEN   WBC, Wet Prep HPF POC <10 <10   Sperm NONE SEEN    US OB LESS THAN 14 WEEKS WITH OB TRANSVAGINAL  Result Date: 01/20/2023 CLINICAL DATA:  Vaginal bleeding.  First trimester pregnancy EXAM: OBSTETRIC <14 WK [place] AND TRANSVAGINAL OB US TECHNIQUE: Both transabdominal and transvaginal ultrasound examinations were performed for complete evaluation of the gestation as well as the maternal uterus, adnexal regions, and pelvic cul-de-sac. Transvaginal technique was performed to assess early pregnancy. COMPARISON:  None Available. FINDINGS: Intrauterine gestational sac: Single Yolk sac:  Visualized. Embryo:  Not Visualized. MSD: 7.4 mm   5 w   3 d Subchorionic hemorrhage:  Small Maternal uterus/adnexae: Transabdominal images are very limited due to overlapping bowel gas and soft tissue. Retroverted uterus. Right ovary measures 2.4 x 1.8 by 1.5 cm. Small follicles are seen. Blood flow on Doppler. Left ovary measures 2.6 by 3.8 x 2.3 cm. There is blood flow on Doppler. Small cysts identified in the left ovary measuring 2.4 cm. This appears simple. IMPRESSION: Intrauterine pregnancy identified with yolk sac gestational sac of 5 weeks and 3 days. No fetal pole at this time. No free fluid.   Small subchorionic hemorrhage. Benign-appearing small left-sided ovarian cyst. Electronically Signed   By: Karen Kays M.D.   On: 01/20/2023 15:54    MAU Course  Procedures  MDM  Assessment and Plan   1. Vaginal bleeding in pregnancy, first trimester   2. [redacted] weeks gestation of pregnancy   3. Type O blood, Rh positive   4. Intrauterine pregnancy   5. Subchorionic hemorrhage of placenta in first trimester    DC home in stable condition  1st Trimester precautions  Bleeding precautions RX: FU outpatient viability scan ordered  Return to MAU as needed FU with OB as planned   Follow-up Information     [facility].   Specialty: Radiology Why: They will call you with an appointment Contact information: [address] [place] [address] [phone]               Thressa Sheller DNP, CNM  01/20/23  4:10 PM

## 2023-01-20 NOTE — MAU Note (Addendum)
Sydney Woods is a 22 y.o. at [redacted]w[redacted]d here in MAU reporting: felt like a weird flow around 1, went to the bathroom and there was blood.  Seems to be getting heavier. Has had miscarriages before. No pain.  LMP: 3/30 Onset of complaint: 1300 Pain score: none Vitals:   01/20/23 1433  BP: 121/76  Pulse: 80  Resp: 18  Temp: (!) 97.3 F (36.3 C)  SpO2: 98%      Lab orders placed from triage:  UPT (+)   Pt reports has seen 2 dr's one being fertility specialist in [place] regarding miscarriages.  Has been told no reason she can't carry.

## 2023-01-21 LAB — GC/CHLAMYDIA PROBE AMP (~~LOC~~) NOT AT ARMC
Chlamydia: NEGATIVE
Comment: NEGATIVE
Comment: NORMAL
Neisseria Gonorrhea: NEGATIVE

## 2023-01-22 ENCOUNTER — Telehealth: Payer: Self-pay | Admitting: Family Medicine

## 2023-01-22 NOTE — Telephone Encounter (Signed)
Patient calling in reguards  to an Ultra sound, she also have some concerns and want to talk with a nurse.

## 2023-01-25 NOTE — Telephone Encounter (Signed)
Returned patients call, received a message that call cannot be completed as dialed x 2. Will try again at a later time.

## 2023-01-27 ENCOUNTER — Inpatient Hospital Stay (HOSPITAL_COMMUNITY): Payer: Medicaid Other

## 2023-01-27 ENCOUNTER — Inpatient Hospital Stay (HOSPITAL_COMMUNITY)
Admission: AD | Admit: 2023-01-27 | Discharge: 2023-01-27 | Disposition: A | Discharge status: Home / Self Care | Payer: Medicaid Other | Attending: Obstetrics & Gynecology | Admitting: Obstetrics & Gynecology

## 2023-01-27 ENCOUNTER — Encounter (HOSPITAL_COMMUNITY): Payer: Self-pay | Admitting: Obstetrics & Gynecology

## 2023-01-27 ENCOUNTER — Other Ambulatory Visit: Payer: Self-pay

## 2023-01-27 DIAGNOSIS — Z3A01 Less than 8 weeks gestation of pregnancy: Secondary | ICD-10-CM | POA: Diagnosis not present

## 2023-01-27 DIAGNOSIS — O209 Hemorrhage in early pregnancy, unspecified: Secondary | ICD-10-CM | POA: Diagnosis present

## 2023-01-27 DIAGNOSIS — O468X1 Other antepartum hemorrhage, first trimester: Secondary | ICD-10-CM | POA: Diagnosis not present

## 2023-01-27 DIAGNOSIS — O208 Other hemorrhage in early pregnancy: Secondary | ICD-10-CM | POA: Insufficient documentation

## 2023-01-27 DIAGNOSIS — O418X10 Other specified disorders of amniotic fluid and membranes, first trimester, not applicable or unspecified: Secondary | ICD-10-CM

## 2023-01-27 NOTE — MAU Provider Note (Incomplete)
History     CSN: [id number]  Arrival date and time: 01/27/23 1237   Event Date/Time   First Provider Initiated Contact with Patient 01/27/23 1336      Chief Complaint  Patient presents with  . Vaginal Bleeding   HPI [name] is a 22 y.o. year old G80P0030 female at [redacted]w[redacted]d weeks gestation who presents to MAU reporting dark, brownish bleeding with wiping and on pad. She reports the VB today is more than it has been over the past 2 weeks. She denies any abdominal pain/cramping. She was diagnosed with a [facility] on 01/20/2023. Her pregnancy history is complicated by SAB x 3 (1st D&C [2020], @ 12 wks [2021], @ 8 wks [end of 2021]). She plans to receive [facility] with [facility]; next appt is virtual visit with RN on 02/24/2023 and NOB on 03/01/2023.   OB History     Gravida  4   Para  0   Term  0   Preterm  0   AB  3   Living  0      SAB  3   IAB  0   Ectopic  0   Multiple  0   Live Births  0           Past Medical History:  Diagnosis Date  . Anxiety   . Chlamydia   . Depression   . Dyspnea    SOB WHEN GETS NERVOUS FOR 4 DAYS LAST TIME 08-08-2019  . PID (pelvic inflammatory disease)   . Syncope    NONE RECENT    Past Surgical History:  Procedure Laterality Date  . DILATION AND EVACUATION N/A 08/09/2019   Procedure: DILATATION AND EVACUATION;  Surgeon: Patton Salles, MD;  Location: [facility];  Service: Gynecology;  Laterality: N/A;    Family History  Problem Relation Age of Onset  . Cancer Mother        pancreatic  . Pancreatitis Mother   . Heart disease Mother   . Pancreatic cancer Mother   . Cancer Father        leukemia  . Diabetes Father   . Leukemia Father   . Healthy Brother   . Alzheimer's disease Maternal Grandmother     Social History   Tobacco Use  . Smoking status: Never  . Smokeless tobacco: Never  Vaping Use  . Vaping Use: Never used  Substance Use Topics  . Alcohol use: Not Currently    Comment: last was a  month ago  . Drug use: Not Currently    Types: Marijuana    Comment: last was 2 wks ago    Allergies:  Allergies  Allergen Reactions  . Tomato Flavor [Flavoring Agent] Other (See Comments)    Bumps on tongue  . Citrus Other (See Comments)    Bumps on tongue     Medications Prior to Admission  Medication Sig Dispense Refill Last Dose  . ondansetron (ZOFRAN ODT) 4 MG disintegrating tablet Take 1 tablet (4 mg total) by mouth every 8 (eight) hours as needed for nausea or vomiting. 21 tablet 0   . Prenatal Vit-Fe Fumarate-FA (PRENATAL VITAMIN PO) Take by mouth.       Review of Systems  Constitutional: Negative.   HENT: Negative.    Eyes: Negative.   Respiratory: Negative.    Cardiovascular: Negative.   Gastrointestinal: Negative.   Endocrine: Negative.   Genitourinary:  Positive for vaginal bleeding.  Musculoskeletal: Negative.   Skin: Negative.  Allergic/Immunologic: Negative.   Neurological: Negative.   Hematological: Negative.   Psychiatric/Behavioral: Negative.     Physical Exam   Blood pressure 110/62, pulse 85, temperature 97.6 F (36.4 C), temperature source Oral, resp. rate 19, height 5\' 8"  (1.727 m), weight 55.6 kg, last menstrual period 12/12/2022, SpO2 100 %.  Physical Exam Vitals and nursing note reviewed.  Constitutional:      Appearance: Normal appearance. She is normal weight.  Cardiovascular:     Rate and Rhythm: Normal rate.  Pulmonary:     Effort: Pulmonary effort is normal.  Abdominal:     General: Abdomen is flat.     Palpations: Abdomen is soft.  Genitourinary:    Comments: 5 cm x 4 cm circular blood spot on peripad Musculoskeletal:        General: Normal range of motion.  Skin:    General: Skin is warm and dry.  Neurological:     Mental Status: She is alert and oriented to person, place, and time.  Psychiatric:        Mood and Affect: Mood normal.        Behavior: Behavior normal.        Thought Content: Thought content normal.         Judgment: Judgment normal.    MAU Course  Procedures  MDM OB < 14 wks U/S  US OB Transvaginal  Result Date: 01/27/2023 CLINICAL DATA:  Subchorionic hematoma infrastructure Mr. EXAM: OBSTETRIC <14 WK [place] AND TRANSVAGINAL OB US TECHNIQUE: Both transabdominal and transvaginal ultrasound examinations were performed for complete evaluation of the gestation as well as the maternal uterus, adnexal regions, and pelvic cul-de-sac. Transvaginal technique was performed to assess early pregnancy. COMPARISON:  Pelvic sonogram dated Jan 20, 2019 FINDINGS: Intrauterine gestational sac: Single Yolk sac:  Visualized. Embryo:  Visualized. Cardiac Activity: Visualized. Heart Rate: 119 bpm CRL:  4.9 mm   6 w   1 d                  [place] EDC: 09/21/2023 Subchorionic hemorrhage: Subchorionic hematoma measuring a proximally 2.0 x 0.7 cm, not significantly changed since prior examination. Maternal uterus/adnexae: Stable left ovarian cyst measuring up to 2.7 cm. Right ovary is unremarkable. IMPRESSION: 1. Single live intrauterine gestation with crown-rump length of 4.9 mm corresponding to a gestational age of [redacted] weeks and 1 day. EDC based on today's sonogram is 09/21/2023. 2. Stable appearance of the subchorionic hematoma measuring a proximally 2.0 x 0.7 cm. Electronically Signed   By: Larose Hires D.O.   On: 01/27/2023 14:38    Assessment and Plan  1. Subchorionic hematoma in first trimester, single or unspecified fetus - Information provided on [facility]  - Reassurance given - Return to MAU: If you have heavier bright, red bleeding that soaks through more that 2 pads per hour for an hour or more If you bleed so much that you feel like you might pass out or you do pass out If you have significant abdominal pain that is not improved with Tylenol 1000 mg every 8 hours as needed for pain If you develop a fever > 100.5   2. [redacted] weeks gestation of pregnancy   - Discharge patient - Keep scheduled appts with [facility] on 02/24/2023 and  03/01/2023 - Patient verbalized an understanding of the plan of care and agrees.   Raelyn Mora, CNM 01/27/2023, 1:36 PM

## 2023-01-27 NOTE — MAU Note (Addendum)
.  Sydney Woods is a 22 y.o. at [redacted]w[redacted]d here in MAU reporting: she's having dark red VB with wiping.  Reports she was informed she has a subchorionic hemorrhage but VB is more than previously noted.  Denies abdominal pain/cramping. Pt showed RN picture, small amt dark VB noted on liner and tissue. LMP: NA Onset of complaint: 2 weeks ago Pain score: 0 Vitals:   01/27/23 1319  BP: 110/62  Pulse: 85  Resp: 19  Temp: 97.6 F (36.4 C)  SpO2: 100%     FHT:NA Lab orders placed from triage:   None

## 2023-01-27 NOTE — Discharge Instructions (Signed)
Return to MAU: If you have heavier bleeding (bright red) that soaks through more that 2 pads per hour for an hour or more If you bleed so much that you feel like you might pass out or you do pass out If you have significant abdominal pain that is not improved with Tylenol 1000 mg every 8 hours as needed for pain If you develop a fever > 100.5

## 2023-01-27 NOTE — MAU Provider Note (Signed)
History     CSN: [id number]  Arrival date and time: 01/27/23 1237   Event Date/Time   First Provider Initiated Contact with Patient 01/27/23 1336      Chief Complaint  Patient presents with   Vaginal Bleeding   HPI [name] is a 22 y.o. year old G78P0030 female at [redacted]w[redacted]d weeks gestation who presents to MAU reporting dark, brownish bleeding with wiping and on pad. She reports the VB today is more than it has been over the past 2 weeks. She denies any abdominal pain/cramping. She was diagnosed with a [facility] on 01/20/2023. Her pregnancy history is complicated by SAB x 3 (1st D&C [2020], @ 12 wks [2021], @ 8 wks [end of 2021]). She plans to receive [facility] with [facility]; next appt is virtual visit with RN on 02/24/2023 and NOB on 03/01/2023.   OB History     Gravida  4   Para  0   Term  0   Preterm  0   AB  3   Living  0      SAB  3   IAB  0   Ectopic  0   Multiple  0   Live Births  0           Past Medical History:  Diagnosis Date   Anxiety    Chlamydia    Depression    Dyspnea    SOB WHEN GETS NERVOUS FOR 4 DAYS LAST TIME 08-08-2019   PID (pelvic inflammatory disease)    Syncope    NONE RECENT    Past Surgical History:  Procedure Laterality Date   DILATION AND EVACUATION N/A 08/09/2019   Procedure: DILATATION AND EVACUATION;  Surgeon: Patton Salles, MD;  Location: [facility];  Service: Gynecology;  Laterality: N/A;    Family History  Problem Relation Age of Onset   Cancer Mother        pancreatic   Pancreatitis Mother    Heart disease Mother    Pancreatic cancer Mother    Cancer Father        leukemia   Diabetes Father    Leukemia Father    Healthy Brother    Alzheimer's disease Maternal Grandmother     Social History   Tobacco Use   Smoking status: Never   Smokeless tobacco: Never  Vaping Use   Vaping Use: Never used  Substance Use Topics   Alcohol use: Not Currently    Comment: last was a month ago   Drug  use: Not Currently    Types: Marijuana    Comment: last was 2 wks ago    Allergies:  Allergies  Allergen Reactions   Tomato Flavor [Flavoring Agent] Other (See Comments)    Bumps on tongue   Citrus Other (See Comments)    Bumps on tongue     Medications Prior to Admission  Medication Sig Dispense Refill Last Dose   ondansetron (ZOFRAN ODT) 4 MG disintegrating tablet Take 1 tablet (4 mg total) by mouth every 8 (eight) hours as needed for nausea or vomiting. 21 tablet 0    Prenatal Vit-Fe Fumarate-FA (PRENATAL VITAMIN PO) Take by mouth.       Review of Systems  Constitutional: Negative.   HENT: Negative.    Eyes: Negative.   Respiratory: Negative.    Cardiovascular: Negative.   Gastrointestinal: Negative.   Endocrine: Negative.   Genitourinary:  Positive for vaginal bleeding.  Musculoskeletal: Negative.   Skin: Negative.  Allergic/Immunologic: Negative.   Neurological: Negative.   Hematological: Negative.   Psychiatric/Behavioral: Negative.     Physical Exam   Blood pressure 110/62, pulse 85, temperature 97.6 F (36.4 C), temperature source Oral, resp. rate 19, height 5\' 8"  (1.727 m), weight 55.6 kg, last menstrual period 12/12/2022, SpO2 100 %.  Physical Exam Vitals and nursing note reviewed.  Constitutional:      Appearance: Normal appearance. She is normal weight.  Cardiovascular:     Rate and Rhythm: Normal rate.  Pulmonary:     Effort: Pulmonary effort is normal.  Abdominal:     General: Abdomen is flat.     Palpations: Abdomen is soft.  Genitourinary:    Comments: 5 cm x 4 cm circular blood spot on peripad Musculoskeletal:        General: Normal range of motion.  Skin:    General: Skin is warm and dry.  Neurological:     Mental Status: She is alert and oriented to person, place, and time.  Psychiatric:        Mood and Affect: Mood normal.        Behavior: Behavior normal.        Thought Content: Thought content normal.        Judgment: Judgment  normal.    MAU Course  Procedures  MDM OB <14 wks U/S  US OB Transvaginal  Result Date: 01/27/2023 CLINICAL DATA:  Subchorionic hematoma infrastructure Mr. EXAM: OBSTETRIC <14 WK [place] AND TRANSVAGINAL OB US TECHNIQUE: Both transabdominal and transvaginal ultrasound examinations were performed for complete evaluation of the gestation as well as the maternal uterus, adnexal regions, and pelvic cul-de-sac. Transvaginal technique was performed to assess early pregnancy. COMPARISON:  Pelvic sonogram dated Jan 20, 2019 FINDINGS: Intrauterine gestational sac: Single Yolk sac:  Visualized. Embryo:  Visualized. Cardiac Activity: Visualized. Heart Rate: 119 bpm CRL:  4.9 mm   6 w   1 d                  [place] EDC: 09/21/2023 Subchorionic hemorrhage: Subchorionic hematoma measuring a proximally 2.0 x 0.7 cm, not significantly changed since prior examination. Maternal uterus/adnexae: Stable left ovarian cyst measuring up to 2.7 cm. Right ovary is unremarkable. IMPRESSION: 1. Single live intrauterine gestation with crown-rump length of 4.9 mm corresponding to a gestational age of [redacted] weeks and 1 day. EDC based on today's sonogram is 09/21/2023. 2. Stable appearance of the subchorionic hematoma measuring a proximally 2.0 x 0.7 cm. Electronically Signed   By: Larose Hires D.O.   On: 01/27/2023 14:38    Assessment and Plan  1. Subchorionic hematoma in first trimester, single or unspecified fetus - Reassurance given that [facility] is stable - same size as in previous  - Information provided on [facility] - Advised that intermittent VB is a normal variance of [facility] - Return to MAU: If you have heavier bleeding that soaks through more that 2 pads per hour for an hour or more If you bleed so much that you feel like you might pass out or you do pass out If you have significant abdominal pain that is not improved with Tylenol 1000 mg every 8 hours as needed for pain If you develop a fever > 100.5    2. [redacted] weeks gestation of pregnancy -  Reassurance given that FHR was detected at a normal rate - Discharge patient - Keep scheduled appts with [facility] on 02/24/2023 & 03/01/2023 - Patient verbalized an understanding of the plan of care  and agrees.     Raelyn Mora, CNM 01/27/2023, 1:36 PM

## 2023-02-03 ENCOUNTER — Ambulatory Visit (HOSPITAL_COMMUNITY)
Admission: RE | Admit: 2023-02-03 | Discharge: 2023-02-03 | Disposition: A | Discharge status: Home / Self Care | Payer: Medicaid Other | Source: Ambulatory Visit | Attending: Advanced Practice Midwife | Admitting: Advanced Practice Midwife

## 2023-02-03 DIAGNOSIS — Z3A01 Less than 8 weeks gestation of pregnancy: Secondary | ICD-10-CM | POA: Insufficient documentation

## 2023-02-03 DIAGNOSIS — O208 Other hemorrhage in early pregnancy: Secondary | ICD-10-CM | POA: Insufficient documentation

## 2023-02-03 DIAGNOSIS — Z3490 Encounter for supervision of normal pregnancy, unspecified, unspecified trimester: Secondary | ICD-10-CM | POA: Insufficient documentation

## 2023-02-03 DIAGNOSIS — O209 Hemorrhage in early pregnancy, unspecified: Secondary | ICD-10-CM | POA: Insufficient documentation

## 2023-02-03 DIAGNOSIS — Z3689 Encounter for other specified antenatal screening: Secondary | ICD-10-CM | POA: Insufficient documentation

## 2023-02-03 DIAGNOSIS — Z6740 Type O blood, Rh positive: Secondary | ICD-10-CM | POA: Insufficient documentation

## 2023-02-14 ENCOUNTER — Inpatient Hospital Stay (HOSPITAL_COMMUNITY)
Admission: AD | Admit: 2023-02-14 | Discharge: 2023-02-14 | Disposition: A | Discharge status: Home / Self Care | Payer: Medicaid Other | Attending: Obstetrics & Gynecology | Admitting: Obstetrics & Gynecology

## 2023-02-14 DIAGNOSIS — O468X1 Other antepartum hemorrhage, first trimester: Secondary | ICD-10-CM | POA: Diagnosis not present

## 2023-02-14 DIAGNOSIS — O208 Other hemorrhage in early pregnancy: Secondary | ICD-10-CM | POA: Diagnosis present

## 2023-02-14 DIAGNOSIS — O418X10 Other specified disorders of amniotic fluid and membranes, first trimester, not applicable or unspecified: Secondary | ICD-10-CM | POA: Diagnosis not present

## 2023-02-14 DIAGNOSIS — Z3A09 9 weeks gestation of pregnancy: Secondary | ICD-10-CM | POA: Diagnosis not present

## 2023-02-14 NOTE — MAU Note (Signed)
Sydney Woods is a 22 y.o. at [redacted]w[redacted]d here in MAU reporting: has a known subchorionic hemorrhage.  Hadn't bled for over a wk.  Had sex last night and had a small amt of bleeding this morning.  It has since stopped.  Came is because she was worried.  No pain, but a weird feeling. No cramping.  Onset of complaint: 0600 Pain score: none Vitals:   02/14/23 1804  BP: 115/61  Pulse: 75  Resp: 16  Temp: 99.3 F (37.4 C)  SpO2: 100%      Lab orders placed from triage:  none

## 2023-02-14 NOTE — MAU Provider Note (Signed)
Chief Complaint: post coital bleeding   Event Date/Time   First Provider Initiated Contact with Patient 02/14/23 1823        SUBJECTIVE HPI: Sydney Woods is a 22 y.o. G4P0030 at [redacted]w[redacted]d by early ultrasound who presents to Maternity Admissions reporting light vaginal bleeding.  Previously diagnosed with subchorionic hemorrhage but bleeding had stopped.  Last IC last night.  Has not had new OB visit but has 1 scheduled at [facility].   Associated signs and symptoms: Negative for abdominal pain, passage of clots or tissue.   O pos  Past Medical History:  Diagnosis Date   Anxiety    Chlamydia    Depression    Dyspnea    SOB WHEN GETS NERVOUS FOR 4 DAYS LAST TIME 08-08-2019   PID (pelvic inflammatory disease)    Syncope    NONE RECENT   OB History  Gravida Para Term Preterm AB Living  4 0 0 0 3 0  SAB IAB Ectopic Multiple Live Births  3 0 0 0 0    # Outcome Date GA Lbr Len/2nd Weight Sex Delivery Anes PTL Lv  4 Current           3 SAB 09/24/20 [redacted]w[redacted]d         2 SAB 02/04/20 [redacted]w[redacted]d         1 SAB 08/05/19 [redacted]w[redacted]d          Past Surgical History:  Procedure Laterality Date   DILATION AND EVACUATION N/A 08/09/2019   Procedure: DILATATION AND EVACUATION;  Surgeon: Patton Salles, MD;  Location: [facility];  Service: Gynecology;  Laterality: N/A;   Social History   Socioeconomic History   Marital status: Single    Spouse name: Not on file   Number of children: Not on file   Years of education: Not on file   Highest education level: Not on file  Occupational History   Not on file  Tobacco Use   Smoking status: Never   Smokeless tobacco: Never  Vaping Use   Vaping Use: Never used  Substance and Sexual Activity   Alcohol use: Not Currently    Comment: last was a month ago   Drug use: Not Currently    Types: Marijuana    Comment: last was 2 wks ago   Sexual activity: Yes    Partners: Male    Birth  control/protection: None  Other Topics Concern   Not on file  Social History Narrative   Not on file   Social Determinants of Health   Financial Resource Strain: Not on file  Food Insecurity: No Food Insecurity (09/09/2020)   Hunger Vital Sign    Worried About Running Out of Food in the Last Year: Never true    Ran Out of Food in the Last Year: Never true  Transportation Needs: No Transportation Needs (09/09/2020)   PRAPARE - Administrator, Civil Service (Medical): No    Lack of Transportation (Non-Medical): No  Physical Activity: Not on file  Stress: Not on file  Social Connections: Not on file  Intimate Partner Violence: Not on file   Family History  Problem Relation Age of Onset   Cancer Mother        pancreatic   Pancreatitis Mother    Heart disease Mother    Pancreatic cancer Mother    Cancer Father        leukemia   Diabetes  Father    Leukemia Father    Healthy Brother    Alzheimer's disease Maternal Grandmother    No current facility-administered medications on file prior to encounter.   Current Outpatient Medications on File Prior to Encounter  Medication Sig Dispense Refill   ondansetron (ZOFRAN ODT) 4 MG disintegrating tablet Take 1 tablet (4 mg total) by mouth every 8 (eight) hours as needed for nausea or vomiting. 21 tablet 0   Prenatal Vit-Fe Fumarate-FA (PRENATAL VITAMIN PO) Take by mouth.     Allergies  Allergen Reactions   Tomato Flavor [Flavoring Agent] Other (See Comments)    Bumps on tongue   Citrus Other (See Comments)    Bumps on tongue     I have reviewed patient's Past Medical Hx, Surgical Hx, Family Hx, Social Hx, medications and allergies.   Review of Systems  Gastrointestinal:  Negative for abdominal pain.  Genitourinary:  Positive for vaginal bleeding (Spotting).    OBJECTIVE Patient Vitals for the past 24 hrs:  BP Temp Temp src Pulse Resp SpO2 Height Weight  02/14/23 1804 115/61 99.3 F (37.4 C) Oral 75 16 100 % 5'  8" (1.727 m) 56.5 kg   Constitutional: Well-developed, well-nourished female in no acute distress.  Cardiovascular: normal rate Respiratory: normal rate and effort.  GI: Abd soft, non-tender.  Fundus nonpalpable. Neurologic: Alert and oriented x 4.  GU: Deferred  LAB RESULTS No results found for this or any previous visit (from the past 24 hour(s)).  IMAGING Pt informed that the ultrasound is considered a limited OB ultrasound and is not intended to be a complete ultrasound exam.  Patient also informed that the ultrasound is not being completed with the intent of assessing for fetal or placental anomalies or any pelvic abnormalities.  Explained that the purpose of today's ultrasound is to assess for  viability.  Fetal heart rate 171.  Patient acknowledges the purpose of the exam and the limitations of the study.     MAU COURSE Orders Placed This Encounter  Procedures   Discharge patient   No orders of the defined types were placed in this encounter.   MDM -[redacted] weeks gestation with spotting after intercourse.  Known subchorionic hemorrhage on previous ultrasound.  Positive cardiac activity per ultrasound.  Rh+.  Patient reassured.  ASSESSMENT 1. Subchorionic hematoma in first trimester, single or unspecified fetus   2. [redacted] weeks gestation of pregnancy     PLAN Discharge home in stable condition. Bleeding precautions Pelvic rest x 1 week.  Follow-up Information     [facility] Follow up.   Specialty: Obstetrics and Gynecology Why: Start prenatal care Contact information: [address] [place] [address] [phone]        [facility] Follow up.   Specialty: Obstetrics and Gynecology Why: As needed in emergencies Contact information: [address] [id number] Wilhemina Bonito [place] [address] [phone]               Allergies as of 02/14/2023        Reactions   Tomato Flavor [flavoring Agent] Other (See Comments)   Bumps on tongue   Citrus Other (See Comments)   Bumps on tongue         Medication List     TAKE these medications    ondansetron 4 MG disintegrating tablet Commonly known as: Zofran ODT Take 1 tablet (4 mg total) by mouth every 8 (eight) hours as  needed for nausea or vomiting.   PRENATAL VITAMIN PO Take by mouth.         Katrinka Blazing, [place], [place] 02/14/2023  6:40 PM

## 2023-02-14 NOTE — Discharge Instructions (Signed)
  CenteringPregnancy is a model of prenatal care that started 30 years ago and is used in about 600 practices around the [place]. You meet with a group of 8-12 women due around the same time as you. In Centering you will have individual time with the provider and meet as a group. There's much more time for discussion and learning. You will actually have much more time with your provider in Centering than in traditional prenatal care.? You will come directly into the Centering room and will not wait in the lobby so there is no wasted time. You will have 2-hour visits every 4 weeks then every 2 weeks. You will know your Centering prenatal appointments in advance. In your last month of pregnancy, you may also come in for some individual visits. Additional appointments can be scheduled if you need more care. Studies have shown that CenteringPregnancy improves birth outcomes. We have seen especially big improvements in fewer Black women delivering babies who are too small or born too early. Visit the website CenteringHealthcare for more information. Let your provider or clinic staff know if you want to sign up or email [email or web address] for more information.   CenteringPregnancy Video

## 2023-02-24 ENCOUNTER — Telehealth: Payer: Medicaid Other

## 2023-02-24 DIAGNOSIS — O0990 Supervision of high risk pregnancy, unspecified, unspecified trimester: Secondary | ICD-10-CM | POA: Insufficient documentation

## 2023-02-24 DIAGNOSIS — O0991 Supervision of high risk pregnancy, unspecified, first trimester: Secondary | ICD-10-CM

## 2023-02-24 DIAGNOSIS — F419 Anxiety disorder, unspecified: Secondary | ICD-10-CM

## 2023-02-24 DIAGNOSIS — F32A Depression, unspecified: Secondary | ICD-10-CM | POA: Insufficient documentation

## 2023-02-24 DIAGNOSIS — O208 Other hemorrhage in early pregnancy: Secondary | ICD-10-CM | POA: Insufficient documentation

## 2023-02-24 DIAGNOSIS — Z3A10 10 weeks gestation of pregnancy: Secondary | ICD-10-CM

## 2023-02-24 HISTORY — DX: Depression, unspecified: F32.A

## 2023-02-24 HISTORY — DX: Anxiety disorder, unspecified: F41.9

## 2023-02-24 HISTORY — DX: Supervision of high risk pregnancy, unspecified, unspecified trimester: O09.90

## 2023-02-24 MED ORDER — BLOOD PRESSURE KIT DEVI: 1.0000 | 0 refills | Status: DC | PRN

## 2023-02-24 NOTE — Patient Instructions (Signed)
Options for Doula Care in the [place]  As you review your birthing options, consider having a birth doula. A doula is trained to provide support before, during and just after you give birth. There are also postpartum doulas that help you adjust to new parenthood.  While doulas do not provide medical care, they do provide emotional, physical and educational support. A few months before your baby arrives, doulas can help answer questions, ease concerns and help you create and support your birthing plan.    Doulas can help reduce your stress and comfort you and your partner. They can help you cope with labor by helping you use breathing techniques, massage, creative labor positioning, essential oils and affirmations.   Studies show that the benefits of having a doula include:   A more positive birth experience  Fewer requests for pain-relief medication  Less likelihood of cesarean section, commonly called a c-section   Doulas are typically hired via a Ad[employer] account planner between you and the doula. We are happy to provide a list of the most active doulas in the area, all of whom are credentialed by [facility] and will not count as a visitor at your birth.  There are several options for no-cost doula care at our hospital, including:  [facility] Volunteer Doula Program Every [employer] Program A Cure 4 Moms Doula Study (available only at [employer] for Women, [place], [place] and [employer] [facility] offices)  For more information on these programs or to receive a list of doulas active in our area, please email [email or web address]

## 2023-02-24 NOTE — Progress Notes (Signed)
New OB Intake  I connected with Sydney Woods  on 02/24/23 at 3:10  by MyChart Video Visit and verified that I am speaking with the correct person using two identifiers. Nurse is located at [facility] and pt is located at parents home.  I discussed the limitations, risks, security and privacy concerns of performing an evaluation and management service by telephone and the availability of in person appointments. I also discussed with the patient that there may be a patient responsible charge related to this service. The patient expressed understanding and agreed to proceed.  I explained I am completing New OB Intake today. We discussed EDD of 09/18/2023, by sure ,regular Last Menstrual Period of 12/12/22. Pt is G4P0030. I reviewed her allergies, medications and Medical/Surgical/OB history.    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 Discussed the nature of our practice with multiple providers including residents and students. Due to the size of the practice, the delivering provider may not be the same as those providing prenatal care.   Patient is interested in water birth. Offered upcoming OB visit with CNM to discuss further.  MyChart/Babyscripts MyChart access verified. I explained pt will have some visits in office and some virtually.   Blood Pressure Cuff/Weight Scale Blood pressure cuff ordered for patient to pick-up from [employer]. Explained after first prenatal appt pt will check weekly and document in Babyscripts. Patient does have weight scale.  Anatomy US Explained first scheduled [place] will be around 19  weeks. Anatomy US scheduled for 04/26/23 at 0730.  Is patient a CenteringPregnancy candidate?  Accepted   Is patient a Mom+Baby Combined Care candidate?  Declined    Interested in Doula? Yes, sent referral and doula dot phrase.   First visit review I reviewed new OB appt with patient. Explained pt will be seen by Dr. Para March at first visit. Discussed Avelina Laine genetic screening with patient. She would like both  Panorama and Horizon drawn with routine prenatal labs at new ob visit.    Last Pap No recent pap smear.   Nancy Fetter 02/24/2023  3:55 PM

## 2023-03-01 ENCOUNTER — Ambulatory Visit (INDEPENDENT_AMBULATORY_CARE_PROVIDER_SITE_OTHER): Payer: Medicaid Other | Admitting: Obstetrics and Gynecology

## 2023-03-01 ENCOUNTER — Telehealth: Payer: Self-pay | Admitting: Family Medicine

## 2023-03-01 ENCOUNTER — Encounter: Payer: Self-pay | Admitting: Obstetrics and Gynecology

## 2023-03-01 ENCOUNTER — Other Ambulatory Visit: Payer: Self-pay

## 2023-03-01 ENCOUNTER — Other Ambulatory Visit (HOSPITAL_COMMUNITY)
Admission: RE | Admit: 2023-03-01 | Discharge: 2023-03-01 | Disposition: A | Discharge status: Home / Self Care | Payer: Medicaid Other | Source: Ambulatory Visit | Attending: Obstetrics and Gynecology | Admitting: Obstetrics and Gynecology

## 2023-03-01 VITALS — BP 109/64 | HR 65 | Wt 127.1 lb

## 2023-03-01 DIAGNOSIS — O99341 Other mental disorders complicating pregnancy, first trimester: Secondary | ICD-10-CM

## 2023-03-01 DIAGNOSIS — F32A Depression, unspecified: Secondary | ICD-10-CM

## 2023-03-01 DIAGNOSIS — O0990 Supervision of high risk pregnancy, unspecified, unspecified trimester: Secondary | ICD-10-CM

## 2023-03-01 DIAGNOSIS — O208 Other hemorrhage in early pregnancy: Secondary | ICD-10-CM

## 2023-03-01 DIAGNOSIS — F419 Anxiety disorder, unspecified: Secondary | ICD-10-CM | POA: Diagnosis not present

## 2023-03-01 DIAGNOSIS — O0991 Supervision of high risk pregnancy, unspecified, first trimester: Secondary | ICD-10-CM

## 2023-03-01 DIAGNOSIS — N6315 Unspecified lump in the right breast, overlapping quadrants: Secondary | ICD-10-CM

## 2023-03-01 DIAGNOSIS — Z3A11 11 weeks gestation of pregnancy: Secondary | ICD-10-CM

## 2023-03-01 MED ORDER — ONDANSETRON 4 MG PO TBDP
4.0000 mg | ORAL_TABLET | Freq: Three times a day (TID) | ORAL | 1 refills | Status: DC | PRN
Start: 2023-03-01 — End: 2023-09-28

## 2023-03-01 MED ORDER — DOXYLAMINE SUCCINATE (SLEEP) 25 MG PO TABS
25.0000 mg | ORAL_TABLET | Freq: Four times a day (QID) | ORAL | 2 refills | Status: DC | PRN
Start: 2023-03-01 — End: 2023-09-28

## 2023-03-01 NOTE — Progress Notes (Signed)
History:   Sydney Woods is a 22 y.o. G4P0030 at [redacted]w[redacted]d by LMP confirmed by 6 week [place] being seen today for her first obstetrical visit.   Patient does intend to breast feed.   Pregnancy history fully reviewed. Obstetrical history is significant for 3 prior losses.  Patient reports no complaints except some persistent N/V. Has not yet taken Zofran.  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        HISTORY: OB History  Gravida Para Term Preterm AB Living  4 0 0 0 3 0  SAB IAB Ectopic Multiple Live Births  3 0 0 0 0    # Outcome Date GA Lbr Len/2nd Weight Sex Delivery Anes PTL Lv  4 Current           3 SAB 09/24/20 [redacted]w[redacted]d         2 SAB 02/04/20 [redacted]w[redacted]d         1 SAB 08/05/19 [redacted]w[redacted]d            Birth Comments: had D&C     Past Medical History:  Diagnosis Date   Anxiety    Chlamydia    Depression    Dyspnea    SOB WHEN GETS NERVOUS FOR 4 DAYS LAST TIME 08-08-2019   PID (pelvic inflammatory disease)    Syncope    NONE RECENT   Past Surgical History:  Procedure Laterality Date   DILATION AND EVACUATION N/A 08/09/2019   Procedure: DILATATION AND EVACUATION;  Surgeon: Sydney Salles, MD;  Location: [facility];  Service: Gynecology;  Laterality: N/A;   Family History  Problem Relation Age of Onset   Cancer Mother        pancreatic   Pancreatitis Mother    Heart disease Mother    Pancreatic cancer Mother    Cancer Father        leukemia   Diabetes Father    Leukemia Father    Healthy Brother    Alzheimer's disease Maternal Grandmother    Social History   Tobacco Use   Smoking status: Never   Smokeless tobacco: Never  Vaping Use   Vaping Use: Never used  Substance Use Topics   Alcohol use: Not Currently    Comment: socially   Drug use: Not Currently    Types: Marijuana    Comment: 02/24/23 last used when found out pregnant   Allergies  Allergen Reactions   Tomato Flavor [Flavoring Agent] Other (See Comments)    Bumps on tongue   Citrus  Other (See Comments)    Bumps on tongue    Current Outpatient Medications on File Prior to Visit  Medication Sig Dispense Refill   Blood Pressure Monitoring (BLOOD PRESSURE KIT) DEVI 1 Device by Does not apply route as needed. 1 each 0   ondansetron (ZOFRAN ODT) 4 MG disintegrating tablet Take 1 tablet (4 mg total) by mouth every 8 (eight) hours as needed for nausea or vomiting. (Patient not taking: Reported on 03/01/2023) 21 tablet 0   Prenatal Vit-Fe Fumarate-FA (PRENATAL VITAMIN PO) Take by mouth. (Patient not taking: Reported on 03/01/2023)     No current facility-administered medications on file prior to visit.    Review of Systems Pertinent items noted in HPI and remainder of comprehensive ROS otherwise negative.  Physical Exam:   Vitals:   03/01/23 0936  BP: 109/64  Pulse: 65  Weight: 127 lb 1.6 oz (57.7 kg)   Fetal Heart Rate (bpm): 169  General: well-developed, well-nourished female in no acute distress  Breasts:  normal appearance, no tenderness bilaterally. Left breast without mass. Right breast with possible lump at 12 oclock vs normal breast tissue.   Skin: normal coloration and turgor, no rashes  Neurologic: oriented, normal, negative, normal mood  Extremities: normal strength, tone, and muscle mass, ROM of all joints is normal  HEENT PERRLA, extraocular movement intact and sclera clear, anicteric  Neck supple and no masses  Cardiovascular: regular rate and rhythm  Respiratory:  no respiratory distress, normal breath sounds  Abdomen: soft, non-tender; bowel sounds normal; no masses,  no organomegaly  Pelvic: normal external genitalia, no lesions, normal vaginal mucosa, normal vaginal discharge, normal cervix, pap smear done. 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 Subchorionic hemorrhage in first trimester Appears to have resolved by subsequent [place] on 5/22.   2. Supervision of high risk pregnancy, antepartum Initial labs drawn. Historically is Rh pos.  Still N/V but never picked up Zofran. Continue prenatal vitamins. Problem list reviewed and updated. Genetic Screening discussed, NIPS: ordered. Ultrasound discussed; fetal anatomic survey: scheduled for August.  Anticipatory guidance about prenatal visits given including labs, ultrasounds, and testing. Discussed usage of Babyscripts and virtual visits  3. Anxiety Denies - doing well  4. Depression  Denies - doing well.   5. Right breast lump - Most likely breast tissue vs fibroadenoma. Feels benign. Check right breast US.   The nature of [place] [facility] Healthcare/Faculty Practice with multiple MDs and Advanced Practice Providers was explained to patient; also emphasized that residents, students are part of our team. Routine obstetric precautions reviewed. Encouraged to seek out care at office or emergency room [facility] MAU preferred) for urgent and/or emergent concerns. Return in about 4 weeks (around 03/29/2023) for LROB VISIT, MD or APP.    Milas Hock, MD, FACOG Obstetrician & Gynecologist, [facility] for [facility], [facility]

## 2023-03-01 NOTE — Telephone Encounter (Signed)
Want to start Centering

## 2023-03-01 NOTE — Patient Instructions (Addendum)
Breast Ultrasound Appointment tomorrow, Tuesday, June 18th at 9:40AM.  Location: [facility]      [address], [place], [place] [address] Phone: [phone]  Safe Medications in Pregnancy   Acne:  Benzoyl Peroxide  Salicylic Acid   Backache/Headache:  Tylenol: 2 regular strength every 4 hours OR               2 Extra strength every 6 hours   Colds/Coughs/Allergies:  Benadryl (alcohol free) 25 mg every 6 hours as needed  Breath right strips  Claritin  Cepacol throat lozenges  Chloraseptic throat spray  Cold-Eeze- up to three times per day  Cough drops, alcohol free  Flonase (by prescription only)  Guaifenesin  Mucinex  Robitussin DM (plain only, alcohol free)  Saline nasal spray/drops  Sudafed (pseudoephedrine) & Actifed * use only after [redacted] weeks gestation and if you do not have high blood pressure  Tylenol  Vicks Vaporub  Zinc lozenges  Zyrtec   Constipation:  Colace  Ducolax suppositories  Fleet enema  Glycerin suppositories  Metamucil  Milk of magnesia  Miralax  Senokot  Smooth move tea   Diarrhea:  Kaopectate  Imodium A-D   *NO pepto Bismol   Hemorrhoids:  Anusol  Anusol HC  Preparation H  Tucks   Indigestion:  Tums  Maalox  Mylanta  Zantac  Pepcid   Insomnia:  Benadryl (alcohol free) 25mg  every 6 hours as needed  Tylenol PM  Unisom, no Gelcaps   Leg Cramps:  Tums  MagGel   Nausea/Vomiting:  Bonine  Dramamine  Emetrol  Ginger extract  Sea bands  Meclizine  Nausea medication to take during pregnancy:  Unisom (doxylamine succinate 25 mg tablets) Take one tablet daily at bedtime. If symptoms are not adequately controlled, the dose can be increased to a maximum recommended dose of two tablets daily (1/2 tablet in the morning, 1/2 tablet mid-afternoon and one at bedtime).  Vitamin B6 100mg  tablets. Take one tablet twice a day (up to 200 mg per day).   Skin Rashes:  Aveeno products  Benadryl cream or  25mg  every 6 hours as needed  Calamine Lotion  1% cortisone cream   Yeast infection:  Gyne-lotrimin 7  Monistat 7    **If taking multiple medications, please check labels to avoid duplicating the same active ingredients  **take medication as directed on the label  ** Do not exceed 4000 mg of tylenol in 24 hours  **Do not take medications that contain aspirin or ibuprofen           Considering Waterbirth? Guide for patients at [facility] for [facility]) Why consider waterbirth? Gentle birth for babies  Less pain medicine used in labor  May allow for passive descent/less pushing  May reduce perineal tears  More mobility and instinctive maternal position changes  Increased maternal relaxation   Is waterbirth safe? What are the risks of infection, drowning or other complications? Infection:  Very low risk (3.7 % for tub vs 4.8% for bed)  7 in 8000 waterbirths with documented infection  Poorly cleaned equipment most common cause  Slightly lower group B strep transmission rate  Drowning  Maternal:  Very low risk  Related to seizures or fainting  Newborn:  Very low risk. No evidence of increased risk of respiratory problems in multiple large studies  Physiological protection from breathing under water  Avoid underwater birth if there are any fetal complications  Once baby's head is out of the  water, keep it out.  Birth complication  Some reports of cord trauma, but risk decreased by bringing baby to surface gradually  No evidence of increased risk of shoulder dystocia. Mothers can usually change positions faster in water than in a bed, possibly aiding the maneuvers to free the shoulder.   There are 2 things you MUST do to have a waterbirth with [facility]: Attend a waterbirth class at [employer] [facility]   3rd Wednesday of every month from 7-9 pm (virtual during COVID) [employer] at [email or web address] or [email or web address] or by  calling [phone] Bring [place] the certificate from the class to your prenatal appointment or send via MyChart Meet with a midwife at 36 weeks* to see if you can still plan a waterbirth and to sign the consent.   *We also recommend that you schedule as many of your prenatal visits with a midwife as possible.    Helpful information: You may want to bring a bathing suit top to the hospital to wear during labor but this is optional.  All other supplies are provided by the hospital. Please arrive at the hospital with signs of active labor, and do not wait at home until late in labor. It takes 45 min- 1 hour for fetal monitoring, and check in to your room to take place, plus transport and filling of the waterbirth tub.    Things that would prevent you from having a waterbirth: Premature, <37wks  Previous cesarean birth  Presence of thick meconium-stained fluid  Multiple gestation (Twins, triplets, etc.)  Uncontrolled diabetes or gestational diabetes requiring medication  Hypertension diagnosed in pregnancy or preexisting hypertension (gestational hypertension, preeclampsia, or chronic hypertension) Fetal growth restriction (your baby measures less than 10th percentile on ultrasound) Heavy vaginal bleeding  Non-reassuring fetal heart rate  Active infection (MRSA, etc.). Group B Strep is NOT a contraindication for waterbirth.  If your labor has to be induced and induction method requires continuous monitoring of the baby's heart rate  Other risks/issues identified by your obstetrical provider   Please remember that birth is unpredictable. Under certain unforeseeable circumstances your provider may advise against giving birth in the tub. These decisions will be made on a case-by-case basis and with the safety of you and your baby as our highest priority.

## 2023-03-02 ENCOUNTER — Other Ambulatory Visit: Payer: Medicaid Other

## 2023-03-02 LAB — CBC/D/PLT+RPR+RH+ABO+RUBIGG...
Antibody Screen: NEGATIVE
Basophils Absolute: 0 10*3/uL (ref 0.0–0.2)
Basos: 0 %
EOS (ABSOLUTE): 0 10*3/uL (ref 0.0–0.4)
Eos: 1 %
HCV Ab: NONREACTIVE
HIV Screen 4th Generation wRfx: NONREACTIVE
Hematocrit: 38.2 % (ref 34.0–46.6)
Hemoglobin: 12.5 g/dL (ref 11.1–15.9)
Hepatitis B Surface Ag: NEGATIVE
Immature Grans (Abs): 0 10*3/uL (ref 0.0–0.1)
Immature Granulocytes: 0 %
Lymphocytes Absolute: 1.7 10*3/uL (ref 0.7–3.1)
Lymphs: 27 %
MCH: 31.2 pg (ref 26.6–33.0)
MCHC: 32.7 g/dL (ref 31.5–35.7)
MCV: 95 fL (ref 79–97)
Monocytes Absolute: 0.5 10*3/uL (ref 0.1–0.9)
Monocytes: 8 %
Neutrophils Absolute: 4.1 10*3/uL (ref 1.4–7.0)
Neutrophils: 64 %
Platelets: 302 10*3/uL (ref 150–450)
RBC: 4.01 x10E6/uL (ref 3.77–5.28)
RDW: 12.1 % (ref 11.7–15.4)
RPR Ser Ql: NONREACTIVE
Rh Factor: POSITIVE
Rubella Antibodies, IGG: 1.89 index (ref >0.99)
WBC: 6.3 10*3/uL (ref 3.4–10.8)

## 2023-03-02 LAB — CYTOLOGY - PAP
Chlamydia: NEGATIVE
Comment: NEGATIVE
Comment: NORMAL
Diagnosis: NEGATIVE
Neisseria Gonorrhea: NEGATIVE

## 2023-03-02 LAB — HEMOGLOBIN A1C
Est. average glucose Bld gHb Est-mCnc: 105 mg/dL
Hgb A1c MFr Bld: 5.3 % (ref 4.8–5.6)

## 2023-03-02 LAB — HCV INTERPRETATION

## 2023-03-03 ENCOUNTER — Encounter: Payer: Self-pay | Admitting: *Deleted

## 2023-03-04 ENCOUNTER — Encounter: Payer: Self-pay | Admitting: General Practice

## 2023-03-10 LAB — PANORAMA PRENATAL TEST FULL PANEL:PANORAMA TEST PLUS 5 ADDITIONAL MICRODELETIONS: FETAL FRACTION: 10.7

## 2023-03-11 ENCOUNTER — Encounter: Payer: Self-pay | Admitting: Obstetrics and Gynecology

## 2023-03-18 ENCOUNTER — Encounter: Payer: Self-pay | Admitting: Obstetrics and Gynecology

## 2023-03-18 DIAGNOSIS — Z148 Genetic carrier of other disease: Secondary | ICD-10-CM | POA: Insufficient documentation

## 2023-03-18 LAB — HORIZON CUSTOM: REPORT SUMMARY: POSITIVE — AB

## 2023-03-19 ENCOUNTER — Telehealth: Payer: Self-pay

## 2023-03-19 NOTE — Telephone Encounter (Addendum)
-----   Message from Milas Hock, MD sent at 03/18/2023  4:06 PM EDT ----- Please offer her genetic counseling (can be done through natera or MFM) and recommend partner testing.  Thanks, pad  Called pt; VM left and MyChart message sent.

## 2023-03-24 ENCOUNTER — Telehealth: Payer: Self-pay | Admitting: *Deleted

## 2023-03-24 NOTE — Telephone Encounter (Signed)
Called patient and informed her I have a group for her to join for CenteringPregnancy if she is still interested. She said yes she is. I instructed her to keep ob fu scheduled 03/29/23 and then will start centering 04/13/23. She voices understanding. Nancy Fetter

## 2023-03-29 ENCOUNTER — Encounter: Payer: BC Managed Care – PPO | Admitting: Student

## 2023-04-12 ENCOUNTER — Telehealth: Payer: Self-pay

## 2023-04-12 NOTE — Telephone Encounter (Signed)
Called Pt to remind of Centering appointment for tomorrow 04/13/23 @9am , called # ending [phone] & mothers# ending [phone], no answer & was not able to leave a message.

## 2023-04-13 ENCOUNTER — Telehealth: Payer: Self-pay | Admitting: Family Medicine

## 2023-04-13 ENCOUNTER — Encounter: Payer: Medicaid Other | Admitting: Advanced Practice Midwife

## 2023-04-13 DIAGNOSIS — N96 Recurrent pregnancy loss: Secondary | ICD-10-CM

## 2023-04-13 DIAGNOSIS — Z3A17 17 weeks gestation of pregnancy: Secondary | ICD-10-CM

## 2023-04-13 DIAGNOSIS — H9191 Unspecified hearing loss, right ear: Secondary | ICD-10-CM

## 2023-04-13 DIAGNOSIS — Z148 Genetic carrier of other disease: Secondary | ICD-10-CM

## 2023-04-13 DIAGNOSIS — Z8619 Personal history of other infectious and parasitic diseases: Secondary | ICD-10-CM

## 2023-04-13 DIAGNOSIS — O0990 Supervision of high risk pregnancy, unspecified, unspecified trimester: Secondary | ICD-10-CM

## 2023-04-13 DIAGNOSIS — F419 Anxiety disorder, unspecified: Secondary | ICD-10-CM

## 2023-04-13 NOTE — Telephone Encounter (Signed)
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

## 2023-04-16 ENCOUNTER — Encounter: Payer: Self-pay | Admitting: *Deleted

## 2023-04-16 NOTE — Telephone Encounter (Signed)
Called patient x2 and heard message " call cannot be completed". Will send MyChart message . Raynald Blend, RN

## 2023-04-17 ENCOUNTER — Encounter (HOSPITAL_COMMUNITY): Payer: Self-pay | Admitting: Obstetrics & Gynecology

## 2023-04-17 ENCOUNTER — Other Ambulatory Visit: Payer: Self-pay

## 2023-04-17 ENCOUNTER — Inpatient Hospital Stay (HOSPITAL_COMMUNITY)
Admission: AD | Admit: 2023-04-17 | Discharge: 2023-04-17 | Disposition: A | Discharge status: Home / Self Care | Payer: Medicaid Other | Attending: Obstetrics & Gynecology | Admitting: Obstetrics & Gynecology

## 2023-04-17 DIAGNOSIS — O26892 Other specified pregnancy related conditions, second trimester: Secondary | ICD-10-CM

## 2023-04-17 DIAGNOSIS — W109XXA Fall (on) (from) unspecified stairs and steps, initial encounter: Secondary | ICD-10-CM | POA: Diagnosis not present

## 2023-04-17 DIAGNOSIS — Z3A18 18 weeks gestation of pregnancy: Secondary | ICD-10-CM | POA: Insufficient documentation

## 2023-04-17 DIAGNOSIS — W19XXXA Unspecified fall, initial encounter: Secondary | ICD-10-CM

## 2023-04-17 DIAGNOSIS — O9A212 Injury, poisoning and certain other consequences of external causes complicating pregnancy, second trimester: Secondary | ICD-10-CM | POA: Insufficient documentation

## 2023-04-17 LAB — URINALYSIS, ROUTINE W REFLEX MICROSCOPIC
Bilirubin Urine: NEGATIVE
Glucose, UA: NEGATIVE mg/dL
Hgb urine dipstick: NEGATIVE
Ketones, ur: NEGATIVE mg/dL
Leukocytes,Ua: NEGATIVE
Nitrite: NEGATIVE
Protein, ur: NEGATIVE mg/dL
Specific Gravity, Urine: 1.014 (ref 1.005–1.030)
pH: 7 (ref 5.0–8.0)

## 2023-04-17 NOTE — Discharge Instructions (Signed)

## 2023-04-17 NOTE — MAU Provider Note (Signed)
History     CSN: [id number]  Arrival date and time: 04/17/23 1306   None     Chief Complaint  Patient presents with   Fall   HPI Sydney Woods is a 22 y.o. G4P0030 at [redacted]w[redacted]d who presents to MAU after a fall. Patient reports around 0100 this morning she missed a step and fell going up the stairs. She reports she landed on her right leg. She was able to protect her abdomen and did not hit it. She denies abdominal pain, vaginal bleeding, or leaking fluid. She just wants to make sure baby is okay.  Pregnancy course: uncomplicated. Receives [facility] at [facility], next appointment is on 8/12.  OB History     Gravida  4   Para  0   Term  0   Preterm  0   AB  3   Living  0      SAB  3   IAB  0   Ectopic  0   Multiple  0   Live Births  0           Past Medical History:  Diagnosis Date   Anxiety    Chlamydia    Depression    Dyspnea    SOB WHEN GETS NERVOUS FOR 4 DAYS LAST TIME 08-08-2019   PID (pelvic inflammatory disease)    Syncope    NONE RECENT    Past Surgical History:  Procedure Laterality Date   DILATION AND EVACUATION N/A 08/09/2019   Procedure: DILATATION AND EVACUATION;  Surgeon: Patton Salles, MD;  Location: [facility];  Service: Gynecology;  Laterality: N/A;    Family History  Problem Relation Age of Onset   Cancer Mother        pancreatic   Pancreatitis Mother    Heart disease Mother    Pancreatic cancer Mother    Cancer Father        leukemia   Diabetes Father    Leukemia Father    Healthy Brother    Alzheimer's disease Maternal Grandmother     Social History   Tobacco Use   Smoking status: Never   Smokeless tobacco: Never  Vaping Use   Vaping status: Never Used  Substance Use Topics   Alcohol use: Not Currently    Comment: socially   Drug use: Not Currently    Types: Marijuana    Comment: 02/24/23 last used when found out pregnant    Allergies:  Allergies  Allergen Reactions   Tomato Flavor  [Flavoring Agent] Other (See Comments)    Bumps on tongue   Citrus Other (See Comments)    Bumps on tongue     No medications prior to admission.   Review of Systems  All other systems reviewed and are negative.  Physical Exam   Blood pressure (!) 107/58, pulse 71, temperature 97.9 F (36.6 C), resp. rate 20, last menstrual period 12/12/2022, SpO2 100%.  Physical Exam Vitals and nursing note reviewed.  Constitutional:      General: She is not in acute distress. Eyes:     Extraocular Movements: Extraocular movements intact.     Pupils: Pupils are equal, round, and reactive to light.  Cardiovascular:     Rate and Rhythm: Normal rate.  Pulmonary:     Effort: Pulmonary effort is normal.  Abdominal:     Palpations: Abdomen is soft.     Tenderness: There is no abdominal tenderness.     Comments: Gravid  Musculoskeletal:        General: Normal range of motion.     Cervical back: Normal range of motion.     Right lower leg: Normal. No swelling, deformity, lacerations or tenderness.     Left lower leg: Normal.  Skin:    General: Skin is warm and dry.  Neurological:     General: No focal deficit present.     Mental Status: She is alert and oriented to person, place, and time.  Psychiatric:        Mood and Affect: Mood normal.        Behavior: Behavior normal.    FHR: 150 bpm via doppler  Results for orders placed or performed during the hospital encounter of 04/17/23 (from the past 24 hour(s))  Urinalysis, Routine w reflex microscopic -Urine, Clean Catch     Status: None   Collection Time: 04/17/23  1:41 PM  Result Value Ref Range   Color, Urine YELLOW YELLOW   APPearance CLEAR CLEAR   Specific Gravity, Urine 1.014 1.005 - 1.030   pH 7.0 5.0 - 8.0   Glucose, UA NEGATIVE NEGATIVE mg/dL   Hgb urine dipstick NEGATIVE NEGATIVE   Bilirubin Urine NEGATIVE NEGATIVE   Ketones, ur NEGATIVE NEGATIVE mg/dL   Protein, ur NEGATIVE NEGATIVE mg/dL   Nitrite NEGATIVE NEGATIVE    Leukocytes,Ua NEGATIVE NEGATIVE   MAU Course  Procedures  MDM BSUS confirms FHR 150  Assessment and Plan   1. [redacted] weeks gestation of pregnancy   2. Fall, initial encounter    - Discharge home in stable condition - Return precautions given. Return to MAU as needed - Keep OB appointment as scheduled   Brand Males, CNM 04/17/2023, 3:26 PM

## 2023-04-17 NOTE — MAU Note (Addendum)
.  Sydney Woods is a 22 y.o. at [redacted]w[redacted]d here in MAU reporting: Pt fell going up the steps. Pt reports she did not hit her belly.   Onset of complaint: today 1300 Pain score: denies  There were no vitals filed for this visit.   FHT: 150 Lab orders placed from triage:   ua

## 2023-04-26 ENCOUNTER — Encounter: Payer: Self-pay | Admitting: *Deleted

## 2023-04-26 ENCOUNTER — Ambulatory Visit: Payer: Medicaid Other | Attending: Maternal & Fetal Medicine

## 2023-04-26 ENCOUNTER — Ambulatory Visit: Payer: Medicaid Other | Attending: Obstetrics and Gynecology

## 2023-04-26 ENCOUNTER — Other Ambulatory Visit: Payer: Self-pay | Admitting: Obstetrics and Gynecology

## 2023-04-26 ENCOUNTER — Ambulatory Visit: Payer: Medicaid Other | Admitting: *Deleted

## 2023-04-26 VITALS — BP 108/57 | HR 57

## 2023-04-26 DIAGNOSIS — O2622 Pregnancy care for patient with recurrent pregnancy loss, second trimester: Secondary | ICD-10-CM | POA: Diagnosis not present

## 2023-04-26 DIAGNOSIS — F419 Anxiety disorder, unspecified: Secondary | ICD-10-CM

## 2023-04-26 DIAGNOSIS — O285 Abnormal chromosomal and genetic finding on antenatal screening of mother: Secondary | ICD-10-CM | POA: Diagnosis not present

## 2023-04-26 DIAGNOSIS — F32A Depression, unspecified: Secondary | ICD-10-CM

## 2023-04-26 DIAGNOSIS — O0990 Supervision of high risk pregnancy, unspecified, unspecified trimester: Secondary | ICD-10-CM

## 2023-04-26 DIAGNOSIS — Z148 Genetic carrier of other disease: Secondary | ICD-10-CM | POA: Diagnosis not present

## 2023-04-26 DIAGNOSIS — Z3A19 19 weeks gestation of pregnancy: Secondary | ICD-10-CM

## 2023-04-26 DIAGNOSIS — O208 Other hemorrhage in early pregnancy: Secondary | ICD-10-CM

## 2023-05-11 ENCOUNTER — Encounter: Payer: Medicaid Other | Admitting: Advanced Practice Midwife

## 2023-05-11 DIAGNOSIS — Z3A21 21 weeks gestation of pregnancy: Secondary | ICD-10-CM

## 2023-05-11 DIAGNOSIS — Z148 Genetic carrier of other disease: Secondary | ICD-10-CM

## 2023-05-11 DIAGNOSIS — F419 Anxiety disorder, unspecified: Secondary | ICD-10-CM

## 2023-05-11 DIAGNOSIS — O0990 Supervision of high risk pregnancy, unspecified, unspecified trimester: Secondary | ICD-10-CM

## 2023-05-19 ENCOUNTER — Encounter: Payer: Self-pay | Admitting: Obstetrics and Gynecology

## 2023-05-25 ENCOUNTER — Other Ambulatory Visit: Payer: Self-pay

## 2023-05-25 ENCOUNTER — Ambulatory Visit (INDEPENDENT_AMBULATORY_CARE_PROVIDER_SITE_OTHER): Payer: Medicaid Other | Admitting: Advanced Practice Midwife

## 2023-05-25 VITALS — BP 103/69 | HR 78 | Wt 153.0 lb

## 2023-05-25 DIAGNOSIS — O0990 Supervision of high risk pregnancy, unspecified, unspecified trimester: Secondary | ICD-10-CM

## 2023-05-25 DIAGNOSIS — Z148 Genetic carrier of other disease: Secondary | ICD-10-CM

## 2023-05-25 DIAGNOSIS — Z3A23 23 weeks gestation of pregnancy: Secondary | ICD-10-CM

## 2023-05-26 LAB — PROTEIN / CREATININE RATIO, URINE
Creatinine, Urine: 136 mg/dL
Protein, Ur: 15.6 mg/dL
Protein/Creat Ratio: 115 mg/g{creat} (ref 0–200)

## 2023-05-31 NOTE — Progress Notes (Signed)
   PRENATAL VISIT NOTE  Subjective:  Sydney Woods is a 22 y.o. G4P0030 at [redacted]w[redacted]d being seen today for ongoing prenatal care.  She is currently monitored for the following issues for this low-risk pregnancy and has Recurrent pregnancy loss; Hearing loss; Supervision of high risk pregnancy, antepartum; Anxiety; Depression; and Carrier of spinal muscular atrophy on their problem list.  Patient reports no complaints.  Contractions: Not present. Vag. Bleeding: None.  Movement: Present. Denies leaking of fluid.   The following portions of the patient's history were reviewed and updated as appropriate: allergies, current medications, past family history, past medical history, past social history, past surgical history and problem list.   Objective:   Vitals:   05/25/23 0921  BP: 103/69  Pulse: 78  Weight: 153 lb (69.4 kg)    Fetal Status:     Movement: Present     General:  Alert, oriented and cooperative. Patient is in no acute distress.  Skin: Skin is warm and dry. No rash noted.   Cardiovascular: Normal heart rate noted  Respiratory: Normal respiratory effort, no problems with respiration noted  Abdomen: Soft, gravid, appropriate for gestational age.  Pain/Pressure: Present     Pelvic: Cervical exam deferred        Extremities: Normal range of motion.  Edema: None  Mental Status: Normal mood and affect. Normal behavior. Normal judgment and thought content.   Assessment and Plan:  Pregnancy: G4P0030 at [redacted]w[redacted]d 1. Supervision of high risk pregnancy, antepartum - Nml anatomy [place] - - Pt is interested in waterbirth.  No contraindications at this time per chart review/patient assessment.   - Pt to enroll in class, see CNMs for most visits in the office.  - Discussed waterbirth as option for low-risk pregnancy.  Reviewed conditions that may arise during pregnancy that will risk pt out of waterbirth including hypertension, diabetes, fetal growth restriction <10%ile, etc.  2. [redacted] weeks gestation  of pregnancy   3. Carrier of spinal muscular atrophy - Offer partner kit at [employer] Pregnancy, Session#4: Reviewed resources in [employer].   Facilitated discussion today: My Family, past, present and future. Family dynamics, parenting, family planning postpartum, and preterm labor. Breastfeeding Popcorn activity completed.    Fundal height and FHR appropriate today unless noted otherwise in plan. Patient to continue group care.    Preterm labor symptoms and general obstetric precautions including but not limited to vaginal bleeding, contractions, leaking of fluid and fetal movement were reviewed in detail with the patient. Please refer to After Visit Summary for other counseling recommendations.   Return for CenteringPregnancy as scheduled.  Future Appointments  Date Time Provider Department Center  06/08/2023  9:00 AM CENTERING PROVIDER [facility]  06/22/2023  9:00 AM CENTERING PROVIDER [facility]  07/06/2023  9:00 AM CENTERING PROVIDER [facility]  07/20/2023  9:00 AM CENTERING PROVIDER [facility]  08/03/2023  9:00 AM CENTERING PROVIDER [facility]  08/17/2023  9:00 AM CENTERING PROVIDER [facility]    Dorathy Kinsman, CNM

## 2023-05-31 NOTE — Patient Instructions (Signed)

## 2023-06-07 ENCOUNTER — Other Ambulatory Visit: Payer: Self-pay

## 2023-06-07 DIAGNOSIS — O0990 Supervision of high risk pregnancy, unspecified, unspecified trimester: Secondary | ICD-10-CM

## 2023-06-08 ENCOUNTER — Encounter: Payer: Medicaid Other | Admitting: Advanced Practice Midwife

## 2023-06-08 DIAGNOSIS — Z3A25 25 weeks gestation of pregnancy: Secondary | ICD-10-CM

## 2023-06-08 DIAGNOSIS — F419 Anxiety disorder, unspecified: Secondary | ICD-10-CM

## 2023-06-08 DIAGNOSIS — Z148 Genetic carrier of other disease: Secondary | ICD-10-CM

## 2023-06-08 DIAGNOSIS — O0990 Supervision of high risk pregnancy, unspecified, unspecified trimester: Secondary | ICD-10-CM

## 2023-06-14 ENCOUNTER — Encounter: Payer: Self-pay | Admitting: *Deleted

## 2023-06-15 ENCOUNTER — Encounter: Payer: Medicaid Other | Admitting: Obstetrics and Gynecology

## 2023-06-15 ENCOUNTER — Other Ambulatory Visit: Payer: Medicaid Other

## 2023-06-15 DIAGNOSIS — Z0289 Encounter for other administrative examinations: Secondary | ICD-10-CM

## 2023-06-16 ENCOUNTER — Other Ambulatory Visit: Payer: Medicaid Other

## 2023-06-16 ENCOUNTER — Other Ambulatory Visit: Payer: Self-pay

## 2023-06-16 DIAGNOSIS — O0990 Supervision of high risk pregnancy, unspecified, unspecified trimester: Secondary | ICD-10-CM

## 2023-06-17 DIAGNOSIS — Z0289 Encounter for other administrative examinations: Secondary | ICD-10-CM

## 2023-06-17 LAB — CBC
Hematocrit: 32 % — ABNORMAL LOW (ref 34.0–46.6)
Hemoglobin: 10.5 g/dL — ABNORMAL LOW (ref 11.1–15.9)
MCH: 31.6 pg (ref 26.6–33.0)
MCHC: 32.8 g/dL (ref 31.5–35.7)
MCV: 96 fL (ref 79–97)
Platelets: 262 10*3/uL (ref 150–450)
RBC: 3.32 x10E6/uL — ABNORMAL LOW (ref 3.77–5.28)
RDW: 11.5 % — ABNORMAL LOW (ref 11.7–15.4)
WBC: 9.5 10*3/uL (ref 3.4–10.8)

## 2023-06-17 LAB — RPR: RPR Ser Ql: NONREACTIVE

## 2023-06-17 LAB — HIV ANTIBODY (ROUTINE TESTING W REFLEX): HIV Screen 4th Generation wRfx: NONREACTIVE

## 2023-06-17 LAB — GLUCOSE TOLERANCE, 2 HOURS W/ 1HR
Glucose, 1 hour: 97 mg/dL (ref 70–179)
Glucose, 2 hour: 76 mg/dL (ref 70–152)
Glucose, Fasting: 81 mg/dL (ref 70–91)

## 2023-06-23 ENCOUNTER — Ambulatory Visit (INDEPENDENT_AMBULATORY_CARE_PROVIDER_SITE_OTHER): Payer: Medicaid Other | Admitting: Family Medicine

## 2023-06-23 ENCOUNTER — Other Ambulatory Visit: Payer: Self-pay

## 2023-06-23 VITALS — BP 115/69 | HR 90 | Wt 148.0 lb

## 2023-06-23 DIAGNOSIS — F32A Depression, unspecified: Secondary | ICD-10-CM

## 2023-06-23 DIAGNOSIS — O99342 Other mental disorders complicating pregnancy, second trimester: Secondary | ICD-10-CM

## 2023-06-23 DIAGNOSIS — N96 Recurrent pregnancy loss: Secondary | ICD-10-CM

## 2023-06-23 DIAGNOSIS — O0990 Supervision of high risk pregnancy, unspecified, unspecified trimester: Secondary | ICD-10-CM

## 2023-06-23 DIAGNOSIS — F419 Anxiety disorder, unspecified: Secondary | ICD-10-CM

## 2023-06-23 DIAGNOSIS — O0992 Supervision of high risk pregnancy, unspecified, second trimester: Secondary | ICD-10-CM

## 2023-06-23 DIAGNOSIS — Z3A27 27 weeks gestation of pregnancy: Secondary | ICD-10-CM

## 2023-06-23 NOTE — Patient Instructions (Signed)

## 2023-06-23 NOTE — Progress Notes (Signed)
   PRENATAL VISIT NOTE  Subjective:  Sydney Woods is a 22 y.o. G4P0030 at [redacted]w[redacted]d being seen today for ongoing prenatal care.  She is currently monitored for the following issues for this high-risk pregnancy and has Recurrent pregnancy loss; Hearing loss; Supervision of high risk pregnancy, antepartum; Anxiety; Depression; and Carrier of spinal muscular atrophy on their problem list.  Patient reports no complaints.  Contractions: Not present. Vag. Bleeding: None.  Movement: Present. Denies leaking of fluid.   The following portions of the patient's history were reviewed and updated as appropriate: allergies, current medications, past family history, past medical history, past social history, past surgical history and problem list.   Objective:   Vitals:   06/23/23 1431  BP: 115/69  Pulse: 90  Weight: 148 lb (67.1 kg)    Fetal Status: Fetal Heart Rate (bpm): 136   Movement: Present     General:  Alert, oriented and cooperative. Patient is in no acute distress.  Skin: Skin is warm and dry. No rash noted.   Cardiovascular: Normal heart rate noted  Respiratory: Normal respiratory effort, no problems with respiration noted  Abdomen: Soft, gravid, appropriate for gestational age.  Pain/Pressure: Absent     Pelvic: Cervical exam deferred        Extremities: Normal range of motion.  Edema: Trace  Mental Status: Normal mood and affect. Normal behavior. Normal judgment and thought content.   Assessment and Plan:  Pregnancy: G4P0030 at [redacted]w[redacted]d 1. Supervision of high risk pregnancy, antepartum Up to date Questions today about - CS rate for practice, delivery provider, tilted cervix and labor complications, number of visitors, whether baby is head down Reviewed all of the above FH appropriate She is interested in [facility] and has NOT attended class yet. She was asking about pain control in the water. And we discussed water is the coping and that other methods cannot be used in water like IV meds,  nitrous or epidural.   - Pt is interested in waterbirth.  No contraindications at this time per chart review/patient assessment.   - Pt to enroll in class, see CNMs for most visits in the office.  - Discussed waterbirth as option for low-risk pregnancy.  Reviewed conditions that may arise during pregnancy that will risk pt out of waterbirth including hypertension, diabetes, fetal growth restriction <10%ile, etc.  2. Recurrent pregnancy loss  3. Anxiety Stable, no concerns  4. Depression during pregnancy in third trimester Stable no concerns  Preterm labor symptoms and general obstetric precautions including but not limited to vaginal bleeding, contractions, leaking of fluid and fetal movement were reviewed in detail with the patient. Please refer to After Visit Summary for other counseling recommendations.   Return in about 2 weeks (around 07/07/2023) for Routine prenatal care.  Future Appointments  Date Time Provider Department Center  07/07/2023  3:15 PM Tereso Newcomer, MD [facility]  07/21/2023  2:35 PM Sue Lush, FNP [facility]  08/04/2023 11:15 AM Sue Lush, FNP [facility]    Federico Flake, MD

## 2023-07-07 ENCOUNTER — Encounter: Payer: Self-pay | Admitting: Obstetrics & Gynecology

## 2023-07-07 ENCOUNTER — Ambulatory Visit (INDEPENDENT_AMBULATORY_CARE_PROVIDER_SITE_OTHER): Payer: Medicaid Other | Admitting: Obstetrics & Gynecology

## 2023-07-07 VITALS — BP 128/76 | HR 111 | Wt 177.5 lb

## 2023-07-07 DIAGNOSIS — O0990 Supervision of high risk pregnancy, unspecified, unspecified trimester: Secondary | ICD-10-CM

## 2023-07-07 DIAGNOSIS — Z3A29 29 weeks gestation of pregnancy: Secondary | ICD-10-CM

## 2023-07-07 DIAGNOSIS — O0993 Supervision of high risk pregnancy, unspecified, third trimester: Secondary | ICD-10-CM

## 2023-07-07 NOTE — Progress Notes (Signed)
   PRENATAL VISIT NOTE  Subjective:  Sydney Woods is a 22 y.o. G4P0030 at [redacted]w[redacted]d being seen today for ongoing prenatal care.  She is currently monitored for the following issues for this high-risk pregnancy and has Recurrent pregnancy loss; Hearing loss; Supervision of high risk pregnancy, antepartum; Anxiety; Depression; and Carrier of spinal muscular atrophy on their problem list.  Patient reports no significant complaints.  Contractions: Not present. Vag. Bleeding: None.  Movement: Present. Denies leaking of fluid.   The following portions of the patient's history were reviewed and updated as appropriate: allergies, current medications, past family history, past medical history, past social history, past surgical history and problem list.   Objective:   Vitals:   07/07/23 1550  BP: 128/76  Pulse: (!) 111  Weight: 177 lb 8 oz (80.5 kg)    Fetal Status: Fetal Heart Rate (bpm): 136 Fundal Height: 29 cm Movement: Present     General:  Alert, oriented and cooperative. Patient is in no acute distress.  Skin: Skin is warm and dry. No rash noted.   Cardiovascular: Normal heart rate noted  Respiratory: Normal respiratory effort, no problems with respiration noted  Abdomen: Soft, gravid, appropriate for gestational age.  Pain/Pressure: Absent     Pelvic: Cervical exam deferred        Extremities: Normal range of motion.  Edema: Trace  Mental Status: Normal mood and affect. Normal behavior. Normal judgment and thought content.   Assessment and Plan:  Pregnancy: G4P0030 at [redacted]w[redacted]d 1. [redacted] weeks gestation of pregnancy 2. Supervision of high risk pregnancy, antepartum Addressed expectations and symptoms that can be encountered in the third trimester. Preterm labor symptoms and general obstetric precautions including but not limited to vaginal bleeding, contractions, leaking of fluid and fetal movement were reviewed in detail with the patient. Please refer to After Visit Summary for other  counseling recommendations.   Return in about 2 weeks (around 07/21/2023) for OFFICE OB VISIT (MD or APP).  Future Appointments  Date Time Provider Department Center  07/21/2023  2:35 PM Sue Lush, FNP [facility]  08/04/2023 11:15 AM Sue Lush, FNP [facility]    Jaynie Collins, MD

## 2023-07-07 NOTE — Patient Instructions (Signed)

## 2023-07-21 ENCOUNTER — Other Ambulatory Visit: Payer: Self-pay

## 2023-07-21 ENCOUNTER — Ambulatory Visit (INDEPENDENT_AMBULATORY_CARE_PROVIDER_SITE_OTHER): Payer: Medicaid Other | Admitting: Obstetrics and Gynecology

## 2023-07-21 VITALS — BP 114/72 | HR 85 | Wt 184.3 lb

## 2023-07-21 DIAGNOSIS — Z23 Encounter for immunization: Secondary | ICD-10-CM | POA: Diagnosis not present

## 2023-07-21 DIAGNOSIS — O0990 Supervision of high risk pregnancy, unspecified, unspecified trimester: Secondary | ICD-10-CM

## 2023-07-21 DIAGNOSIS — Z3A31 31 weeks gestation of pregnancy: Secondary | ICD-10-CM

## 2023-07-21 DIAGNOSIS — O0993 Supervision of high risk pregnancy, unspecified, third trimester: Secondary | ICD-10-CM

## 2023-07-21 NOTE — Progress Notes (Unsigned)
   PRENATAL VISIT NOTE  Subjective:  Sydney Woods is a 22 y.o. G4P0030 at [redacted]w[redacted]d being seen today for ongoing prenatal care.  She is currently monitored for the following issues for this {Blank single:19197::"high-risk","low-risk"} pregnancy and has Recurrent pregnancy loss; Hearing loss; Supervision of high risk pregnancy, antepartum; Anxiety; Depression; and Carrier of spinal muscular atrophy on their problem list.  Patient reports {sx:14538}.  Contractions: Not present. Vag. Bleeding: None.  Movement: Present. Denies leaking of fluid.   The following portions of the patient's history were reviewed and updated as appropriate: allergies, current medications, past family history, past medical history, past social history, past surgical history and problem list.   Objective:   Vitals:   07/21/23 1453  BP: 114/72  Pulse: 85  Weight: 184 lb 4.8 oz (83.6 kg)    Fetal Status: Fetal Heart Rate (bpm): 129   Movement: Present     General:  Alert, oriented and cooperative. Patient is in no acute distress.  Skin: Skin is warm and dry. No rash noted.   Cardiovascular: Normal heart rate noted  Respiratory: Normal respiratory effort, no problems with respiration noted  Abdomen: Soft, gravid, appropriate for gestational age.  Pain/Pressure: Absent     Pelvic: {Blank single:19197::"Cervical exam performed in the presence of a chaperone","Cervical exam deferred"}        Extremities: Normal range of motion.  Edema: Mild pitting, slight indentation (hands and legs/ankles)  Mental Status: Normal mood and affect. Normal behavior. Normal judgment and thought content.   Assessment and Plan:  Pregnancy: G4P0030 at [redacted]w[redacted]d 1. [redacted] weeks gestation of pregnancy *** - Tdap vaccine greater than or equal to 7yo IM  {Blank single:19197::"Term","Preterm"} labor symptoms and general obstetric precautions including but not limited to vaginal bleeding, contractions, leaking of fluid and fetal movement were reviewed in  detail with the patient. Please refer to After Visit Summary for other counseling recommendations.   No follow-ups on file.  Future Appointments  Date Time Provider Department Center  08/04/2023 11:15 AM Sue Lush, FNP [facility]    Albertine Grates, FNP

## 2023-07-21 NOTE — Patient Instructions (Addendum)
Natural labor class  Considering Waterbirth? Guide for patients at Center for [facility]) Why consider waterbirth? Gentle birth for babies  Less pain medicine used in labor  May allow for passive descent/less pushing  May reduce perineal tears  More mobility and instinctive maternal position changes  Increased maternal relaxation   Is waterbirth safe? What are the risks of infection, drowning or other complications? Infection:  Very low risk (3.7 % for tub vs 4.8% for bed)  7 in 8000 waterbirths with documented infection  Poorly cleaned equipment most common cause  Slightly lower group B strep transmission rate  Drowning  Maternal:  Very low risk  Related to seizures or fainting  Newborn:  Very low risk. No evidence of increased risk of respiratory problems in multiple large studies  Physiological protection from breathing under water  Avoid underwater birth if there are any fetal complications  Once baby's head is out of the water, keep it out.  Birth complication  Some reports of cord trauma, but risk decreased by bringing baby to surface gradually  No evidence of increased risk of shoulder dystocia. Mothers can usually change positions faster in water than in a bed, possibly aiding the maneuvers to free the shoulder.   There are 2 things you MUST do to have a waterbirth with [facility]: Attend a waterbirth class at [facility]   3rd Wednesday of every month from 7-9 pm (virtual during COVID) [employer] at [email or web address] or [email or web address] or by calling [phone] Bring [place] the certificate from the class to your prenatal appointment or send via MyChart Meet with a midwife at 36 weeks* to see if you can still plan a waterbirth and to sign the consent.   *We also recommend that you schedule as many of your prenatal visits with a midwife as possible.    Helpful information: You may want to bring a bathing suit  top to the hospital to wear during labor but this is optional.  All other supplies are provided by the hospital. Please arrive at the hospital with signs of active labor, and do not wait at home until late in labor. It takes 45 min- 1 hour for fetal monitoring, and check in to your room to take place, plus transport and filling of the waterbirth tub.    Things that would prevent you from having a waterbirth: Premature, <37wks  Previous cesarean birth  Presence of thick meconium-stained fluid  Multiple gestation (Twins, triplets, etc.)  Uncontrolled diabetes or gestational diabetes requiring medication  Hypertension diagnosed in pregnancy or preexisting hypertension (gestational hypertension, preeclampsia, or chronic hypertension) Fetal growth restriction (your baby measures less than 10th percentile on ultrasound) Heavy vaginal bleeding  Non-reassuring fetal heart rate  Active infection (MRSA, etc.). Group B Strep is NOT a contraindication for waterbirth.  If your labor has to be induced and induction method requires continuous monitoring of the baby's heart rate  Other risks/issues identified by your obstetrical provider   Please remember that birth is unpredictable. Under certain unforeseeable circumstances your provider may advise against giving birth in the tub. These decisions will be made on a case-by-case basis and with the safety of you and your baby as our highest priority.    Updated 12/17/21

## 2023-07-25 ENCOUNTER — Encounter: Payer: Self-pay | Admitting: Family Medicine

## 2023-08-04 ENCOUNTER — Ambulatory Visit (INDEPENDENT_AMBULATORY_CARE_PROVIDER_SITE_OTHER): Payer: Medicaid Other | Admitting: Obstetrics and Gynecology

## 2023-08-04 VITALS — BP 111/72 | HR 118 | Wt 193.4 lb

## 2023-08-04 DIAGNOSIS — Z3A33 33 weeks gestation of pregnancy: Secondary | ICD-10-CM

## 2023-08-04 DIAGNOSIS — O0990 Supervision of high risk pregnancy, unspecified, unspecified trimester: Secondary | ICD-10-CM

## 2023-08-04 DIAGNOSIS — O0993 Supervision of high risk pregnancy, unspecified, third trimester: Secondary | ICD-10-CM

## 2023-08-04 NOTE — Progress Notes (Signed)
   PRENATAL VISIT NOTE  Subjective:  Sydney Woods is a 22 y.o. G4P0030 at [redacted]w[redacted]d being seen today for ongoing prenatal care.  She is currently monitored for the following issues for this low-risk pregnancy and has Recurrent pregnancy loss; Hearing loss; Supervision of high risk pregnancy, antepartum; Anxiety; Depression; and Carrier of spinal muscular atrophy on their problem list.  Patient reports lower leg swelling, denies headache, visual changes  Contractions: Not present. Vag. Bleeding: None.  Movement: Present. Denies leaking of fluid.   The following portions of the patient's history were reviewed and updated as appropriate: allergies, current medications, past family history, past medical history, past social history, past surgical history and problem list.   Objective:   Vitals:   08/04/23 1149  BP: 111/72  Pulse: (!) 118  Weight: 193 lb 6.4 oz (87.7 kg)    Fetal Status: Fetal Heart Rate (bpm): 130   Movement: Present     General:  Alert, oriented and cooperative. Patient is in no acute distress.  Skin: Skin is warm and dry. No rash noted.   Cardiovascular: Normal heart rate noted  Respiratory: Normal respiratory effort, no problems with respiration noted  Abdomen: Soft, gravid, appropriate for gestational age.  Pain/Pressure: Present     Pelvic: Cervical exam deferred        Extremities: Normal range of motion.     Mental Status: Normal mood and affect. Normal behavior. Normal judgment and thought content.   Assessment and Plan:  Pregnancy: G4P0030 at [redacted]w[redacted]d 1. Supervision of high risk pregnancy, antepartum BP and FHR normal Doing well, feeling regular movement  FH appropriate  2. [redacted] weeks gestation of pregnancy Anticipatory guidance regarding upcoming appts Supportive measures regarding swelling, rest, elevation, compression socks, precautions discussed when to follow up Waterbirth class 12/5  Preterm labor symptoms and general obstetric precautions including but  not limited to vaginal bleeding, contractions, leaking of fluid and fetal movement were reviewed in detail with the patient. Please refer to After Visit Summary for other counseling recommendations.   Return in two week for routine prenatal care Future Appointments  Date Time Provider Department Center  08/23/2023 10:55 AM Sue Lush, FNP [facility]     Albertine Grates, FNP

## 2023-08-17 ENCOUNTER — Telehealth: Payer: Self-pay

## 2023-08-17 NOTE — Telephone Encounter (Signed)
Patient called nurse line with concerns of not feeling well. Stating that she has had a stuffy nose and sore throat x2 days. Wanting to know what is safe for her to take since pregnant.   Maureen Ralphs RN on 08/17/23 at [phone]

## 2023-08-17 NOTE — Telephone Encounter (Signed)
Called patient back at number listed in chart--verified using full name and DOB. Patient reaffirmed that she has been having sore throat and congestion/stuffy nose x3 days now. Patient denies any known contact with anyone who has had RSV, COVID, or flu.   Verified with patient that she is able to use MyChart; informed patient that I would send a MyChart message with a list of safe OTC medications she can take while pregnant. Informed patient that if she notices any changes in baby (decreased fetal movement, vaginal bleeding, etc.) to either contact the office immediately or head to MAU. Patient verbalized understanding; patient also asked if it was safe for her to drink a "Medicine [place]" from [place]. I had an in-office provider Crissie Reese MD) review Medicine [place] Pies ingredients--per provider, it is safe for patient to drink Medicine Ball from [place]. Patient verbalized understanding and had no further questions or concerns.   Maureen Ralphs RN on 08/17/23 at [phone]

## 2023-08-23 ENCOUNTER — Other Ambulatory Visit: Payer: Self-pay

## 2023-08-23 ENCOUNTER — Ambulatory Visit (INDEPENDENT_AMBULATORY_CARE_PROVIDER_SITE_OTHER): Payer: Medicaid Other | Admitting: Obstetrics and Gynecology

## 2023-08-23 ENCOUNTER — Other Ambulatory Visit (HOSPITAL_COMMUNITY)
Admission: RE | Admit: 2023-08-23 | Discharge: 2023-08-23 | Disposition: A | Discharge status: Home / Self Care | Payer: Medicaid Other | Source: Ambulatory Visit | Attending: Obstetrics and Gynecology | Admitting: Obstetrics and Gynecology

## 2023-08-23 VITALS — BP 124/76 | HR 86 | Wt 198.0 lb

## 2023-08-23 DIAGNOSIS — L299 Pruritus, unspecified: Secondary | ICD-10-CM

## 2023-08-23 DIAGNOSIS — O0990 Supervision of high risk pregnancy, unspecified, unspecified trimester: Secondary | ICD-10-CM

## 2023-08-23 DIAGNOSIS — D649 Anemia, unspecified: Secondary | ICD-10-CM

## 2023-08-23 DIAGNOSIS — Z3A36 36 weeks gestation of pregnancy: Secondary | ICD-10-CM

## 2023-08-23 DIAGNOSIS — O2686 Pruritic urticarial papules and plaques of pregnancy (PUPPP): Secondary | ICD-10-CM

## 2023-08-23 DIAGNOSIS — O99013 Anemia complicating pregnancy, third trimester: Secondary | ICD-10-CM

## 2023-08-23 NOTE — Patient Instructions (Signed)
Things to Try After 37 weeks to Encourage Labor/Get Ready for Labor:    Try the [employer] at [email or web address].[email or web address] daily to improve baby's position and encourage the onset of labor.  Walk a little and rest a little every day.  Change positions often.  Cervical Ripening: May try one or both Red Raspberry Leaf capsules or tea:  two 300mg  or 400mg  tablets with each meal, 2-3 times a day, or 1-3 cups of tea daily  Potential Side Effects Of Raspberry Leaf:  Most women do not experience any side effects from drinking raspberry leaf tea. However, nausea and loose stools are possible   Evening Primrose Oil capsules: take 1 capsule by mouth and place one capsule in the vagina every night.    Some of the potential side effects:  Upset stomach  Loose stools or diarrhea  Headaches  Nausea  Sex can also help the cervix ripen and encourage labor onset.

## 2023-08-23 NOTE — Progress Notes (Signed)
[facility]  PRENATAL VISIT NOTE  Subjective:  Sydney Woods is a 22 y.o. G4P0030 at [redacted]w[redacted]d being seen today for ongoing prenatal care.  She is currently monitored for the following issues for this low-risk pregnancy and has Recurrent pregnancy loss; Hearing loss; Supervision of high risk pregnancy, antepartum; Anxiety; Depression; and Carrier of spinal muscular atrophy on their problem list.  Patient reports  itching hands and feet , denies rash, no specific time, feeling regular fetal movement.  Contractions: Irritability. Vag. Bleeding: None.  Movement: Present. Denies leaking of fluid.   The following portions of the patient's history were reviewed and updated as appropriate: allergies, current medications, past family history, past medical history, past social history, past surgical history and problem list.   Objective:   Vitals:   08/23/23 1117  BP: 124/76  Pulse: 86  Weight: 198 lb (89.8 kg)    Fetal Status: Fetal Heart Rate (bpm): 127 Fundal Height: 36 cm Movement: Present  Presentation: Vertex  General:  Alert, oriented and cooperative. Patient is in no acute distress.  Skin: Skin is warm and dry. No rash noted.   Cardiovascular: Normal heart rate noted  Respiratory: Normal respiratory effort, no problems with respiration noted  Abdomen: Soft, gravid, appropriate for gestational age.  Pain/Pressure: Present     Pelvic: Cervical exam performed in the presence of a chaperone Dilation: 1 Effacement (%): Thick Station: -3  Extremities: Normal range of motion.  Edema: Trace  Mental Status: Normal mood and affect. Normal behavior. Normal judgment and thought content.   Assessment and Plan:  Pregnancy: G4P0030 at [redacted]w[redacted]d 1. Supervision of high risk pregnancy, antepartum BP and FHR normal Doing well, feeling regular movement   - GC/Chlamydia probe amp ([place])not at [facility] - Culture, beta strep (group b only) - Respiratory syncytial virus vaccine, preF, subunit,  bivalent,(Abrysvo)  2. [redacted] weeks gestation of pregnancy Swabs collected today RSV given today Discussed labor readiness starting at 37 weeks  Water birth class completed and certificate sent  3. Itching Will check to rule out ICP, precautions given when to follow up  - Comp Met (CMET) - Bile acids, total - CBC    Preterm labor symptoms and general obstetric precautions including but not limited to vaginal bleeding, contractions, leaking of fluid and fetal movement were reviewed in detail with the patient. Please refer to After Visit Summary for other counseling recommendations.   Return in 1 week for routine prenatal  Albertine Grates, FNP

## 2023-08-24 LAB — GC/CHLAMYDIA PROBE AMP (~~LOC~~) NOT AT ARMC
Chlamydia: NEGATIVE
Comment: NEGATIVE
Comment: NORMAL
Neisseria Gonorrhea: NEGATIVE

## 2023-08-24 LAB — COMPREHENSIVE METABOLIC PANEL
ALT: 10 [IU]/L (ref 0–32)
AST: 20 [IU]/L (ref 0–40)
Albumin: 3.6 g/dL — ABNORMAL LOW (ref 4.0–5.0)
Alkaline Phosphatase: 100 [IU]/L (ref 44–121)
BUN/Creatinine Ratio: 12 (ref 9–23)
BUN: 7 mg/dL (ref 6–20)
Bilirubin Total: <0.2 mg/dL (ref 0.0–1.2)
CO2: 18 mmol/L — ABNORMAL LOW (ref 20–29)
Calcium: 8.7 mg/dL (ref 8.7–10.2)
Chloride: 106 mmol/L (ref 96–106)
Creatinine, Ser: 0.6 mg/dL (ref 0.57–1.00)
Globulin, Total: 2.7 g/dL (ref 1.5–4.5)
Glucose: 99 mg/dL (ref 70–99)
Potassium: 3.7 mmol/L (ref 3.5–5.2)
Sodium: 139 mmol/L (ref 134–144)
Total Protein: 6.3 g/dL (ref 6.0–8.5)
eGFR: 128 mL/min/{1.73_m2} (ref >59)

## 2023-08-24 LAB — CBC
Hematocrit: 31.8 % — ABNORMAL LOW (ref 34.0–46.6)
Hemoglobin: 10.2 g/dL — ABNORMAL LOW (ref 11.1–15.9)
MCH: 30.4 pg (ref 26.6–33.0)
MCHC: 32.1 g/dL (ref 31.5–35.7)
MCV: 95 fL (ref 79–97)
Platelets: 302 10*3/uL (ref 150–450)
RBC: 3.35 x10E6/uL — ABNORMAL LOW (ref 3.77–5.28)
RDW: 13.2 % (ref 11.7–15.4)
WBC: 8 10*3/uL (ref 3.4–10.8)

## 2023-08-24 LAB — BILE ACIDS, TOTAL: Bile Acids Total: 5 umol/L (ref 0.0–10.0)

## 2023-08-24 MED ORDER — FERROUS GLUCONATE 324 (38 FE) MG PO TABS
324.0000 mg | ORAL_TABLET | Freq: Every day | ORAL | 3 refills | Status: DC
Start: 2023-08-24 — End: 2024-08-04

## 2023-08-24 NOTE — Addendum Note (Signed)
Addended by: Sue Lush on: 08/24/2023 05:05 PM   Modules accepted: Orders

## 2023-08-26 LAB — CULTURE, BETA STREP (GROUP B ONLY): Strep Gp B Culture: NEGATIVE

## 2023-08-31 ENCOUNTER — Ambulatory Visit: Payer: Medicaid Other | Admitting: Advanced Practice Midwife

## 2023-08-31 VITALS — BP 121/70 | HR 86 | Wt 202.5 lb

## 2023-08-31 DIAGNOSIS — L299 Pruritus, unspecified: Secondary | ICD-10-CM

## 2023-08-31 DIAGNOSIS — O0990 Supervision of high risk pregnancy, unspecified, unspecified trimester: Secondary | ICD-10-CM

## 2023-08-31 DIAGNOSIS — Z148 Genetic carrier of other disease: Secondary | ICD-10-CM

## 2023-08-31 DIAGNOSIS — O99713 Diseases of the skin and subcutaneous tissue complicating pregnancy, third trimester: Secondary | ICD-10-CM

## 2023-08-31 DIAGNOSIS — Z3A37 37 weeks gestation of pregnancy: Secondary | ICD-10-CM

## 2023-08-31 NOTE — Progress Notes (Signed)
   PRENATAL VISIT NOTE  Subjective:  Sydney Woods is a 22 y.o. G4P0030 at [redacted]w[redacted]d being seen today for ongoing prenatal care.  She is currently monitored for the following issues for this low-risk pregnancy and has Recurrent pregnancy loss; Hearing loss; Supervision of high risk pregnancy, antepartum; Anxiety; Depression; and Carrier of spinal muscular atrophy on their problem list.  Patient reports no bleeding, no leaking, and occasional contractions.  Contractions: Irritability. Vag. Bleeding: None.  Movement: Present. Denies leaking of fluid.   The following portions of the patient's history were reviewed and updated as appropriate: allergies, current medications, past family history, past medical history, past social history, past surgical history and problem list.   Objective:   Vitals:   08/31/23 1346  BP: 121/70  Pulse: 86  Weight: 202 lb 8 oz (91.9 kg)    Fetal Status: Fetal Heart Rate (bpm): 144   Movement: Present     General:  Alert, oriented and cooperative. Patient is in no acute distress.  Skin: Skin is warm and dry. No rash noted.   Cardiovascular: Normal heart rate noted  Respiratory: Normal respiratory effort, no problems with respiration noted  Abdomen: Soft, gravid, appropriate for gestational age.  Pain/Pressure: Present     Pelvic: Cervical exam deferred        Extremities: Normal range of motion.  Edema: Mild pitting, slight indentation  Mental Status: Normal mood and affect. Normal behavior. Normal judgment and thought content.   Assessment and Plan:  Pregnancy: G4P0030 at [redacted]w[redacted]d 1. Supervision of high risk pregnancy, antepartum (Primary) - GBS neg  2. [redacted] weeks gestation of pregnancy   Term labor symptoms and general obstetric precautions including but not limited to vaginal bleeding, contractions, leaking of fluid and fetal movement were reviewed in detail with the patient. Please refer to After Visit Summary for other counseling recommendations.   No  follow-ups on file.  Future Appointments  Date Time Provider Department Center  09/09/2023 10:55 AM Reva Bores, MD [facility]    Dorathy Kinsman, CNM

## 2023-09-09 ENCOUNTER — Ambulatory Visit: Payer: Medicaid Other | Admitting: Family Medicine

## 2023-09-09 ENCOUNTER — Other Ambulatory Visit: Payer: Self-pay

## 2023-09-09 VITALS — BP 127/77 | HR 93 | Wt 202.0 lb

## 2023-09-09 DIAGNOSIS — O0990 Supervision of high risk pregnancy, unspecified, unspecified trimester: Secondary | ICD-10-CM

## 2023-09-09 DIAGNOSIS — O0993 Supervision of high risk pregnancy, unspecified, third trimester: Secondary | ICD-10-CM

## 2023-09-09 DIAGNOSIS — Z3A38 38 weeks gestation of pregnancy: Secondary | ICD-10-CM

## 2023-09-09 NOTE — Progress Notes (Signed)
   PRENATAL VISIT NOTE  Subjective:  Sydney Woods is a 22 y.o. G4P0030 at [redacted]w[redacted]d being seen today for ongoing prenatal care.  She is currently monitored for the following issues for this low-risk pregnancy and has Recurrent pregnancy loss; Hearing loss; Supervision of high risk pregnancy, antepartum; Anxiety; Depression; and Carrier of spinal muscular atrophy on their problem list.  Patient reports no complaints.  Contractions: Not present. Vag. Bleeding: None.  Movement: Present. Denies leaking of fluid.   The following portions of the patient's history were reviewed and updated as appropriate: allergies, current medications, past family history, past medical history, past social history, past surgical history and problem list.   Objective:   Vitals:   09/09/23 1119  BP: 127/77  Pulse: 93  Weight: 202 lb (91.6 kg)    Fetal Status: Fetal Heart Rate (bpm): 132 Fundal Height: 36 cm Movement: Present  Presentation: Vertex  General:  Alert, oriented and cooperative. Patient is in no acute distress.  Skin: Skin is warm and dry. No rash noted.   Cardiovascular: Normal heart rate noted  Respiratory: Normal respiratory effort, no problems with respiration noted  Abdomen: Soft, gravid, appropriate for gestational age.  Pain/Pressure: Absent     Pelvic: Cervical exam performed in the presence of a chaperone        Extremities: Normal range of motion.  Edema: None  Mental Status: Normal mood and affect. Normal behavior. Normal judgment and thought content.   Assessment and Plan:  Pregnancy: G4P0030 at [redacted]w[redacted]d 1. Supervision of high risk pregnancy, antepartum (Primary) Continue routine prenatal care.  2. [redacted] weeks gestation of pregnancy   Preterm labor symptoms and general obstetric precautions including but not limited to vaginal bleeding, contractions, leaking of fluid and fetal movement were reviewed in detail with the patient. Please refer to After Visit Summary for other counseling  recommendations.   Return in 1 week (on 09/16/2023).  Future Appointments  Date Time Provider Department Center  09/14/2023 10:35 AM Littleton Bing, MD [facility]  09/25/2023 12:00 AM [facility] SCHED ROOM [facility] None    Reva Bores, MD

## 2023-09-12 ENCOUNTER — Other Ambulatory Visit: Payer: Self-pay

## 2023-09-12 ENCOUNTER — Inpatient Hospital Stay (HOSPITAL_COMMUNITY)
Admission: AD | Admit: 2023-09-12 | Discharge: 2023-09-13 | Disposition: A | Discharge status: Home / Self Care | Payer: Medicaid Other | Attending: Obstetrics & Gynecology | Admitting: Obstetrics & Gynecology

## 2023-09-12 ENCOUNTER — Encounter (HOSPITAL_COMMUNITY): Payer: Self-pay | Admitting: Obstetrics & Gynecology

## 2023-09-12 DIAGNOSIS — Z711 Person with feared health complaint in whom no diagnosis is made: Secondary | ICD-10-CM | POA: Insufficient documentation

## 2023-09-12 DIAGNOSIS — F439 Reaction to severe stress, unspecified: Secondary | ICD-10-CM | POA: Insufficient documentation

## 2023-09-12 DIAGNOSIS — O26893 Other specified pregnancy related conditions, third trimester: Secondary | ICD-10-CM | POA: Insufficient documentation

## 2023-09-12 DIAGNOSIS — Z3A39 39 weeks gestation of pregnancy: Secondary | ICD-10-CM | POA: Insufficient documentation
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
## 2023-09-13 DIAGNOSIS — F439 Reaction to severe stress, unspecified: Secondary | ICD-10-CM

## 2023-09-13 DIAGNOSIS — Z3A39 39 weeks gestation of pregnancy: Secondary | ICD-10-CM

## 2023-09-13 DIAGNOSIS — O99891 Other specified diseases and conditions complicating pregnancy: Secondary | ICD-10-CM | POA: Diagnosis not present

## 2023-09-13 DIAGNOSIS — Z711 Person with feared health complaint in whom no diagnosis is made: Secondary | ICD-10-CM | POA: Diagnosis not present

## 2023-09-13 DIAGNOSIS — O26893 Other specified pregnancy related conditions, third trimester: Secondary | ICD-10-CM | POA: Diagnosis present

## 2023-09-14 ENCOUNTER — Ambulatory Visit: Payer: Medicaid Other | Admitting: Obstetrics and Gynecology

## 2023-09-14 VITALS — BP 125/89 | HR 100 | Wt 198.3 lb

## 2023-09-14 DIAGNOSIS — O368130 Decreased fetal movements, third trimester, not applicable or unspecified: Secondary | ICD-10-CM

## 2023-09-14 DIAGNOSIS — Z3A39 39 weeks gestation of pregnancy: Secondary | ICD-10-CM | POA: Diagnosis not present

## 2023-09-14 NOTE — Progress Notes (Signed)
   PRENATAL VISIT NOTE  Subjective:  Sydney Woods is a 22 y.o. G4P0030 at [redacted]w[redacted]d being seen today for ongoing prenatal care.  She is currently monitored for the following issues for this low-risk pregnancy and has Recurrent pregnancy loss; Hearing loss; Supervision of high risk pregnancy, antepartum; Anxiety; Depression; and Carrier of spinal muscular atrophy on their problem list.  Patient reports  just waking up, no breakfast yet and ?decreased FM .  Contractions: Irritability. Vag. Bleeding: None.  Movement: (!) Decreased. Denies leaking of fluid.   The following portions of the patient's history were reviewed and updated as appropriate: allergies, current medications, past family history, past medical history, past social history, past surgical history and problem list.   Objective:   Vitals:   09/14/23 1113  BP: 125/89  Pulse: 100  Weight: 198 lb 4.8 oz (89.9 kg)    Fetal Status: Fetal Heart Rate (bpm): 131 Fundal Height: 39 cm Movement: (!) Decreased  Presentation: Vertex  General:  Alert, oriented and cooperative. Patient is in no acute distress.  Skin: Skin is warm and dry. No rash noted.   Cardiovascular: Normal heart rate noted  Respiratory: Normal respiratory effort, no problems with respiration noted  Abdomen: Soft, gravid, appropriate for gestational age.  Pain/Pressure: Present     Pelvic: Cervical exam performed in the presence of a chaperone Dilation: 1.5 Effacement (%): 50 Station: -3  Extremities: Normal range of motion.  Edema: None  Mental Status: Normal mood and affect. Normal behavior. Normal judgment and thought content.   Assessment and Plan:  Pregnancy: G4P0030 at [redacted]w[redacted]d 1. [redacted] weeks gestation of pregnancy (Primary) GBS neg  2. Decreased fetal movements in third trimester, single or unspecified fetus Will do NST. If reactive, I told her to go and eat and get hydrated and if still feels like decreased FM by early evening to come to hospital for evaluation.    Term labor symptoms and general obstetric precautions including but not limited to vaginal bleeding, contractions, leaking of fluid and fetal movement were reviewed in detail with the patient. Please refer to After Visit Summary for other counseling recommendations.   Return in about 1 week (around 09/21/2023) for low risk ob, in person, md or app, in office nst/bpp.  Future Appointments  Date Time Provider Department Center  09/25/2023 12:00 AM [facility] SCHED ROOM [facility] None    Bebe Furry, MD

## 2023-09-15 NOTE — L&D Delivery Note (Signed)
 OB/GYN Faculty Practice Delivery Note  Sydney Woods is a 23 y.o. G4P0030 s/p SVD at [redacted]w[redacted]d. She was admitted for PDIOL.   ROM: 15h 58m with clear fluid GBS Status:  Negative/-- (12/09 0450) Maximum Maternal Temperature: 98.25F  Labor Progress: Initial SVE: 1.5/50/-3. She received FB and PO cytotec  followed by AROM and pitocin  augmentation. She then progressed to complete.   Delivery Date/Time: [phone] 09/26/23 Delivery: Called to room and patient was complete and pushing. Head delivered DOA with LUE compound hand. Nuchal cord present along with LUE compound hand that required sweep with delivery of left hand prior to anterior shoulder delivery. Shoulder and body delivered in usual fashion there after. Infant with spontaneous cry, placed on mother's abdomen, dried and stimulated. Cord clamped x 2 after 3-minute delay, and cut by MGM. Cord blood drawn. Placenta delivered spontaneously with gentle cord traction. Fundus firm with massage, lower uterine sweep and Pitocin . Labia, perineum, vagina, and cervix inspected with R labial / peri-clitoral laceration requiring repair with usual running fashion. Continued to have more ooze than normal so received single dose of IM methergine  and repeated fundal rubs with return to firmness with adequate bleeding. Mom and baby doing well.   Baby Weight: pending  Placenta: 3 vessel, intact. Sent to L&D Complications: None Lacerations: as above EBL: 211 mL Analgesia: Epidural   Infant:  APGAR (1 MIN): 8  APGAR (5 MINS): 9   Sydney JAYSON Slade, MD [facility] Family Medicine Fellow, [facility] for [facility], [facility] 09/26/2023, 6:04 AM

## 2023-09-20 ENCOUNTER — Encounter (HOSPITAL_COMMUNITY): Payer: Self-pay

## 2023-09-20 ENCOUNTER — Telehealth (HOSPITAL_COMMUNITY): Payer: Self-pay | Admitting: *Deleted

## 2023-09-20 NOTE — Telephone Encounter (Signed)
 Preadmission screen

## 2023-09-21 ENCOUNTER — Ambulatory Visit: Payer: Medicaid Other

## 2023-09-21 ENCOUNTER — Telehealth (HOSPITAL_COMMUNITY): Payer: Self-pay | Admitting: *Deleted

## 2023-09-21 ENCOUNTER — Encounter (HOSPITAL_COMMUNITY): Payer: Self-pay | Admitting: *Deleted

## 2023-09-21 ENCOUNTER — Other Ambulatory Visit: Payer: Self-pay

## 2023-09-21 ENCOUNTER — Ambulatory Visit (INDEPENDENT_AMBULATORY_CARE_PROVIDER_SITE_OTHER): Payer: Medicaid Other | Admitting: Obstetrics and Gynecology

## 2023-09-21 VITALS — BP 107/75 | HR 81 | Wt 201.2 lb

## 2023-09-21 DIAGNOSIS — O480 Post-term pregnancy: Secondary | ICD-10-CM | POA: Diagnosis not present

## 2023-09-21 DIAGNOSIS — O0990 Supervision of high risk pregnancy, unspecified, unspecified trimester: Secondary | ICD-10-CM

## 2023-09-21 DIAGNOSIS — Z3A40 40 weeks gestation of pregnancy: Secondary | ICD-10-CM | POA: Diagnosis not present

## 2023-09-21 NOTE — Telephone Encounter (Signed)
 Preadmission screen

## 2023-09-21 NOTE — Progress Notes (Signed)
   PRENATAL VISIT NOTE  Subjective:  Danique Hartsough is a 23 y.o. G4P0030 at [redacted]w[redacted]d being seen today for ongoing prenatal care.  She is currently monitored for the following issues for this low-risk pregnancy and has Recurrent pregnancy loss; Hearing loss; Supervision of high risk pregnancy, antepartum; Anxiety; Depression; and Carrier of spinal muscular atrophy on their problem list.  Patient reports no complaints.  Contractions: Not present. Vag. Bleeding: None.  Movement: Present. Denies leaking of fluid.   The following portions of the patient's history were reviewed and updated as appropriate: allergies, current medications, past family history, past medical history, past social history, past surgical history and problem list.   Objective:   Vitals:   09/21/23 1523  BP: 107/75  Pulse: 81  Weight: 201 lb 3.2 oz (91.3 kg)    Fetal Status: Fetal Heart Rate (bpm): 132 Fundal Height: 40 cm Movement: Present  Presentation: Vertex  General:  Alert, oriented and cooperative. Patient is in no acute distress.  Skin: Skin is warm and dry. No rash noted.   Cardiovascular: Normal heart rate noted  Respiratory: Normal respiratory effort, no problems with respiration noted  Abdomen: Soft, gravid, appropriate for gestational age.  Pain/Pressure: Absent     Pelvic: Cervical exam performed in the presence of a chaperone Dilation: 1.5 Effacement (%): 50 Station: -3  Extremities: Normal range of motion.  Edema: Trace  Mental Status: Normal mood and affect. Normal behavior. Normal judgment and thought content.   Assessment and Plan:  Pregnancy: G4P0030 at [redacted]w[redacted]d 1. Supervision of high risk pregnancy, antepartum (Primary) BP and FHR normal Doing well, feeling regular movement  FH appropriate  2. [redacted] weeks gestation of pregnancy Had BPP today  IOL scheduled for 1/11 Labor precautions discussed Discussed aspects at the hospital, pain management, cooler for placenta  Term labor symptoms and general  obstetric precautions including but not limited to vaginal bleeding, contractions, leaking of fluid and fetal movement were reviewed in detail with the patient. Please refer to After Visit Summary for other counseling recommendations.   Return postpartum   Future Appointments  Date Time Provider Department Center  09/25/2023 12:00 AM [facility] SCHED ROOM [facility] None    Nidia Daring, FNP

## 2023-09-25 ENCOUNTER — Inpatient Hospital Stay (HOSPITAL_COMMUNITY): Payer: Medicaid Other | Admitting: Anesthesiology

## 2023-09-25 ENCOUNTER — Inpatient Hospital Stay (HOSPITAL_COMMUNITY)
Admission: AD | Admit: 2023-09-25 | Discharge: 2023-09-28 | DRG: 807 | Disposition: A | Discharge status: Home / Self Care | Payer: Medicaid Other | Attending: Family Medicine | Admitting: Family Medicine

## 2023-09-25 ENCOUNTER — Inpatient Hospital Stay (HOSPITAL_COMMUNITY): Payer: Medicaid Other

## 2023-09-25 ENCOUNTER — Encounter (HOSPITAL_COMMUNITY): Payer: Self-pay | Admitting: Family Medicine

## 2023-09-25 DIAGNOSIS — Z148 Genetic carrier of other disease: Secondary | ICD-10-CM | POA: Diagnosis not present

## 2023-09-25 DIAGNOSIS — Z8249 Family history of ischemic heart disease and other diseases of the circulatory system: Secondary | ICD-10-CM | POA: Diagnosis not present

## 2023-09-25 DIAGNOSIS — Z833 Family history of diabetes mellitus: Secondary | ICD-10-CM | POA: Diagnosis not present

## 2023-09-25 DIAGNOSIS — Z3A38 38 weeks gestation of pregnancy: Secondary | ICD-10-CM

## 2023-09-25 DIAGNOSIS — Z3A41 41 weeks gestation of pregnancy: Secondary | ICD-10-CM

## 2023-09-25 DIAGNOSIS — O99344 Other mental disorders complicating childbirth: Secondary | ICD-10-CM | POA: Diagnosis not present

## 2023-09-25 DIAGNOSIS — O9902 Anemia complicating childbirth: Secondary | ICD-10-CM | POA: Diagnosis present

## 2023-09-25 DIAGNOSIS — O480 Post-term pregnancy: Secondary | ICD-10-CM | POA: Diagnosis present

## 2023-09-25 LAB — CBC
HCT: 33.5 % — ABNORMAL LOW (ref 36.0–46.0)
Hemoglobin: 11.4 g/dL — ABNORMAL LOW (ref 12.0–15.0)
MCH: 32.1 pg (ref 26.0–34.0)
MCHC: 34 g/dL (ref 30.0–36.0)
MCV: 94.4 fL (ref 80.0–100.0)
Platelets: 267 10*3/uL (ref 150–400)
RBC: 3.55 MIL/uL — ABNORMAL LOW (ref 3.87–5.11)
RDW: 13.9 % (ref 11.5–15.5)
WBC: 7.7 10*3/uL (ref 4.0–10.5)
nRBC: 0 % (ref 0.0–0.2)

## 2023-09-25 LAB — TYPE AND SCREEN
ABO/RH(D): O POS
Antibody Screen: NEGATIVE

## 2023-09-25 MED ORDER — LIDOCAINE HCL URETHRAL/MUCOSAL 2 % EX GEL
1.0000 | Freq: Once | CUTANEOUS | Status: AC
  Administered 2023-09-26: 1 via URETHRAL
  Filled 2023-09-25: qty 6

## 2023-09-25 MED ORDER — OXYTOCIN-SODIUM CHLORIDE 30-0.9 UT/500ML-% IV SOLN
2.5000 [IU]/h | INTRAVENOUS | Status: DC
  Administered 2023-09-26: 2.5 [IU]/h via INTRAVENOUS

## 2023-09-25 MED ORDER — FENTANYL CITRATE (PF) 100 MCG/2ML IJ SOLN
50.0000 ug | INTRAMUSCULAR | Status: DC | PRN
  Administered 2023-09-25: 50 ug via INTRAVENOUS
  Administered 2023-09-25 (×3): 100 ug via INTRAVENOUS
  Administered 2023-09-25: 50 ug via INTRAVENOUS
  Filled 2023-09-25 (×5): qty 2

## 2023-09-25 MED ORDER — FENTANYL-BUPIVACAINE-NACL 0.5-0.125-0.9 MG/250ML-% EP SOLN
12.0000 mL/h | EPIDURAL | Status: DC | PRN
  Administered 2023-09-25: 12 mL/h via EPIDURAL
  Filled 2023-09-25: qty 250

## 2023-09-25 MED ORDER — EPHEDRINE 5 MG/ML INJ: 10.0000 mg | INTRAVENOUS | Status: DC | PRN

## 2023-09-25 MED ORDER — LIDOCAINE-EPINEPHRINE (PF) 2 %-1:200000 IJ SOLN
INTRAMUSCULAR | Status: DC | PRN
  Administered 2023-09-25: 5 mL via EPIDURAL

## 2023-09-25 MED ORDER — OXYTOCIN BOLUS FROM INFUSION
333.0000 mL | Freq: Once | INTRAVENOUS | Status: AC
  Administered 2023-09-26: 333 mL via INTRAVENOUS

## 2023-09-25 MED ORDER — LACTATED RINGERS IV SOLN: INTRAVENOUS | Status: DC

## 2023-09-25 MED ORDER — FLEET ENEMA RE ENEM: 1.0000 | ENEMA | RECTAL | Status: DC | PRN

## 2023-09-25 MED ORDER — LIDOCAINE HCL (PF) 1 % IJ SOLN
30.0000 mL | INTRAMUSCULAR | Status: AC | PRN
  Administered 2023-09-26: 30 mL via SUBCUTANEOUS
  Filled 2023-09-25: qty 30

## 2023-09-25 MED ORDER — ONDANSETRON HCL 4 MG/2ML IJ SOLN
4.0000 mg | Freq: Four times a day (QID) | INTRAMUSCULAR | Status: DC | PRN
  Administered 2023-09-25 – 2023-09-26 (×2): 4 mg via INTRAVENOUS
  Filled 2023-09-25 (×2): qty 2

## 2023-09-25 MED ORDER — MISOPROSTOL 25 MCG QUARTER TABLET: 25.0000 ug | ORAL_TABLET | Freq: Once | ORAL | Status: DC

## 2023-09-25 MED ORDER — OXYTOCIN-SODIUM CHLORIDE 30-0.9 UT/500ML-% IV SOLN
1.0000 m[IU]/min | INTRAVENOUS | Status: DC
  Administered 2023-09-25: 2 m[IU]/min via INTRAVENOUS
  Filled 2023-09-25: qty 500

## 2023-09-25 MED ORDER — MISOPROSTOL 50MCG HALF TABLET
50.0000 ug | ORAL_TABLET | Freq: Once | ORAL | Status: AC
  Administered 2023-09-25: 50 ug via ORAL
  Filled 2023-09-25: qty 1

## 2023-09-25 MED ORDER — LIDOCAINE-EPINEPHRINE (PF) 1.5 %-1:200000 IJ SOLN
INTRAMUSCULAR | Status: DC | PRN
  Administered 2023-09-25: 5 mL via EPIDURAL

## 2023-09-25 MED ORDER — PHENYLEPHRINE 80 MCG/ML (10ML) SYRINGE FOR IV PUSH (FOR BLOOD PRESSURE SUPPORT): 80.0000 ug | PREFILLED_SYRINGE | INTRAVENOUS | Status: DC | PRN

## 2023-09-25 MED ORDER — TERBUTALINE SULFATE 1 MG/ML IJ SOLN: 0.2500 mg | Freq: Once | INTRAMUSCULAR | Status: DC | PRN

## 2023-09-25 MED ORDER — LACTATED RINGERS IV SOLN
500.0000 mL | Freq: Once | INTRAVENOUS | Status: AC
  Administered 2023-09-25: 500 mL via INTRAVENOUS

## 2023-09-25 MED ORDER — SOD CITRATE-CITRIC ACID 500-334 MG/5ML PO SOLN: 30.0000 mL | ORAL | Status: DC | PRN

## 2023-09-25 MED ORDER — ACETAMINOPHEN 325 MG PO TABS
650.0000 mg | ORAL_TABLET | ORAL | Status: DC | PRN
  Administered 2023-09-26: 650 mg via ORAL
  Filled 2023-09-25: qty 2

## 2023-09-25 MED ORDER — LACTATED RINGERS IV SOLN: 500.0000 mL | INTRAVENOUS | Status: AC | PRN

## 2023-09-25 MED ORDER — DIPHENHYDRAMINE HCL 50 MG/ML IJ SOLN: 12.5000 mg | INTRAMUSCULAR | Status: DC | PRN

## 2023-09-25 NOTE — Anesthesia Procedure Notes (Signed)
 Epidural Patient location during procedure: OB Start time: 09/25/2023 6:58 PM End time: 09/25/2023 7:11 PM  Staffing Anesthesiologist: Dorethea Cordella SQUIBB, DO Performed: anesthesiologist   Preanesthetic Checklist Completed: patient identified, IV checked, site marked, risks and benefits discussed, surgical consent, monitors and equipment checked, pre-op evaluation and timeout performed  Epidural Patient position: sitting Prep: ChloraPrep Patient monitoring: heart rate, continuous pulse ox and blood pressure Approach: midline Location: L3-L4 Injection technique: LOR saline  Needle:  Needle type: Tuohy  Needle gauge: 17 G Needle length: 9 cm Needle insertion depth: 8 cm Catheter type: closed end flexible Catheter size: 20 Guage Catheter at skin depth: 14 cm Test dose: negative and 1.5% lidocaine  with Epi 1:200 K  Assessment Events: blood not aspirated, no cerebrospinal fluid, injection not painful, no injection resistance and no paresthesia  Additional Notes Patient identified. Risks/Benefits/Options discussed with patient including but not limited to bleeding, infection, nerve damage, paralysis, failed block, incomplete pain control, headache, blood pressure changes, nausea, vomiting, reactions to medications, itching and postpartum back pain. Confirmed with bedside nurse the patient's most recent platelet count. Confirmed with patient that they are not currently taking any anticoagulation, have any bleeding history or any family history of bleeding disorders. Patient expressed understanding and wished to proceed. All questions were answered. Sterile technique was used throughout the entire procedure. Please see nursing notes for vital signs. Test dose was given through epidural catheter and negative prior to continuing to dose epidural or start infusion. Warning signs of high block given to the patient including shortness of breath, tingling/numbness in hands, complete motor block,  or any concerning symptoms with instructions to call for help. Patient was given instructions on fall risk and not to get out of bed. All questions and concerns addressed with instructions to call with any issues or inadequate analgesia.    Reason for block:procedure for pain

## 2023-09-25 NOTE — Progress Notes (Signed)
 Labor Progress Note Shanzay Hepworth is a 23 y.o. G4P0030 at [redacted]w[redacted]d presented for PDIOL S: Coping well now that FB fell out. Discussed role, risks, benefits of AROM after FB; pt agreeable.   O:  BP 124/76   Pulse 75   Temp 98.2 F (36.8 C) (Oral)   Resp 18   Ht 5' 8 (1.727 m)   Wt 93.1 kg   LMP 12/12/2022 (Exact Date)   SpO2 98%   BMI 31.22 kg/m  EFM: 125/moderate variability/accels present/no decels  CVE: Dilation: 4.5 Effacement (%): 50 Station: -2, -3 Presentation: Vertex Exam by:: Dr. Loyola   A&P: 23 y.o. [id number] [redacted]w[redacted]d admitted for PDIOL #Labor: Progressing well. AROM clear. Will start pitocin  if contractions do not pick up spontaneously.  #Pain: Mild; may shower before pitocin  if desired #FWB: Cat I #GBS negative  Mardy Loyola, MD 2:42 PM

## 2023-09-25 NOTE — H&P (Signed)
 OBSTETRIC ADMISSION HISTORY AND PHYSICAL  Sydney Woods is a 23 y.o. female G43P0030 with IUP at [redacted]w[redacted]d by LMP presenting for PDIOL. She reports +FMs, No LOF, no VB, no blurry vision, headaches or peripheral edema, and RUQ pain.  She plans on breast feeding. She declines birth control. 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  Past Medical History: Past Medical History:  Diagnosis Date   Anxiety    Chlamydia    Depression    Dyspnea    SOB WHEN GETS NERVOUS FOR 4 DAYS LAST TIME 08-08-2019   History of chlamydia 04/13/2017   03/2017 - treated in ED     PID (pelvic inflammatory disease)    Recurrent pregnancy loss    Syncope    NONE RECENT    Past Surgical History: Past Surgical History:  Procedure Laterality Date   DILATION AND EVACUATION N/A 08/09/2019   Procedure: DILATATION AND EVACUATION;  Surgeon: Cathlyn JAYSON Nikki Bobie FORBES, MD;  Location: [facility] [place];  Service: Gynecology;  Laterality: N/A;    Obstetrical History: OB History     Gravida  4   Para  0   Term  0   Preterm  0   AB  3   Living  0      SAB  3   IAB  0   Ectopic  0   Multiple  0   Live Births  0           Social History Social History   Socioeconomic History   Marital status: Single    Spouse name: Not on file   Number of children: Not on file   Years of education: Not on file   Highest education level: Not on file  Occupational History   Not on file  Tobacco Use   Smoking status: Never   Smokeless tobacco: Never  Vaping Use   Vaping status: Never Used  Substance and Sexual Activity   Alcohol use: Not Currently    Comment: socially   Drug use: Not Currently    Comment:  02/24/23 last used when found out pregnant   Sexual activity: Yes    Partners: Male    Birth control/protection: None  Other Topics Concern   Not on file  Social History Narrative   Not on file   Social Drivers of Health   Financial Resource Strain: Not on file  Food Insecurity: No Food Insecurity (09/25/2023)   Hunger Vital Sign    Worried About Running Out of Food in the Last Year: Never true    Ran Out of Food in the Last Year: Never true  Transportation Needs: No Transportation Needs (09/25/2023)   PRAPARE - Administrator, Civil Service (Medical): No    Lack of Transportation (Non-Medical): No  Physical Activity: Not on file  Stress: Not on file  Social Connections: Moderately Isolated (09/25/2023)   Social Connection and Isolation Panel [NHANES]    Frequency of Communication with Friends and Family: More than three times a week    Frequency of Social Gatherings with Friends and Family: Twice a week    Attends Religious Services: Never    Active Member of  Clubs or Organizations: No    Attends Banker Meetings: Never    Marital Status: Living with partner    Family History: Family History  Problem Relation Age of Onset   Cancer Mother        pancreatic   Pancreatitis Mother    Heart disease Mother    Pancreatic cancer Mother    Cancer Father        leukemia   Diabetes Father    Leukemia Father    Healthy Brother    Alzheimer's disease Maternal Grandmother     Allergies: Allergies  Allergen Reactions   Tomato Flavor [Flavoring Agent] Other (See Comments)    Bumps on tongue   Citrus Other (See Comments)    Bumps on tongue     Medications Prior to Admission  Medication Sig Dispense Refill Last Dose/Taking   ferrous gluconate  (FERGON) 324 MG tablet Take 1 tablet (324 mg total) by mouth daily with breakfast. 30 tablet 3 09/24/2023   Prenatal Vit-Fe Fumarate-FA (PRENATAL VITAMIN PO) Take by mouth.   09/24/2023   Blood Pressure Monitoring  (BLOOD PRESSURE KIT) DEVI 1 Device by Does not apply route as needed. (Patient not taking: Reported on 05/25/2023) 1 each 0    doxylamine , Sleep, (UNISOM ) 25 MG tablet Take 1 tablet (25 mg total) by mouth 4 (four) times daily as needed (nausea and vomiting). (Patient not taking: Reported on 04/26/2023) 30 tablet 2    ondansetron  (ZOFRAN  ODT) 4 MG disintegrating tablet Take 1 tablet (4 mg total) by mouth every 8 (eight) hours as needed for nausea or vomiting. (Patient not taking: Reported on 04/26/2023) 30 tablet 1      Review of Systems   All systems reviewed and negative except as stated in HPI  Blood pressure 121/72, pulse 89, temperature 98.3 F (36.8 C), temperature source Oral, resp. rate 18, height 5' 8 (1.727 m), weight 93.1 kg, last menstrual period 12/12/2022, SpO2 98%. General appearance: alert, cooperative, appears stated age, and mild distress Lungs: clear to auscultation bilaterally Heart: regular rate and rhythm Abdomen: soft, non-tender; bowel sounds normal Pelvic: adequate, unproven Extremities: Homans sign is negative, no sign of DVT DTR's 2+ Presentation: cephalic Fetal monitoringBaseline: 125 bpm, Variability: Good {> 6 bpm), Accelerations: Reactive, and Decelerations: Absent Uterine activityFrequency: Every 10-15 minutes    Prenatal labs: ABO, Rh: --/--/PENDING (01/11 0805) Antibody: PENDING (01/11 0805) Rubella: 1.89 (06/17 1100) RPR: Non Reactive (10/02 0951)  HBsAg: Negative (06/17 1100)  HIV: Non Reactive (10/02 0951)  GBS: Negative/-- (12/09 0450)  2 hr Glucola normal Genetic screening LR female, Positive for Spinal Muscular Atrophy,  Anatomy US  normal  Prenatal Transfer Tool  Maternal Diabetes: No Genetic Screening: Abnormal:  Results: Other: Positive for Spinal Muscular Atrophy  Maternal Ultrasounds/Referrals: Normal Fetal Ultrasounds or other Referrals:  None Maternal Substance Abuse:  No Significant Maternal Medications:  None Significant Maternal  Lab Results:  Group B Strep negative Number of Prenatal Visits:greater than 3 verified prenatal visits Other Comments:  None  Results for orders placed or performed during the hospital encounter of 09/25/23 (from the past 24 hours)  Type and screen   Collection Time: 09/25/23  8:05 AM  Result Value Ref Range   ABO/RH(D) PENDING    Antibody Screen PENDING    Sample Expiration      09/28/2023,2359 Performed at [facility], [address] [place], [place] [address]   CBC   Collection Time: 09/25/23  8:09 AM  Result Value Ref  Range   WBC 7.7 4.0 - 10.5 K/uL   RBC 3.55 (L) 3.87 - 5.11 MIL/uL   Hemoglobin 11.4 (L) 12.0 - 15.0 g/dL   HCT 66.4 (L) 63.9 - 53.9 %   MCV 94.4 80.0 - 100.0 fL   MCH 32.1 26.0 - 34.0 pg   MCHC 34.0 30.0 - 36.0 g/dL   RDW 86.0 88.4 - 84.4 %   Platelets 267 150 - 400 K/uL   nRBC 0.0 0.0 - 0.2 %    Patient Active Problem List   Diagnosis Date Noted   Post term pregnancy over 40 weeks 09/25/2023   Carrier of spinal muscular atrophy 03/18/2023   Supervision of high risk pregnancy, antepartum 02/24/2023   Anxiety 02/24/2023   Depression 02/24/2023   Hearing loss 11/14/2021   Recurrent pregnancy loss 09/24/2020    Assessment/Plan:  Sydney Woods is a 23 y.o. G4P0030 at [redacted]w[redacted]d here for PDIOL  #Labor:Dual Cytotec . FB as able. Augmentation with AROM and pitocin  as indicated #Pain: Per pt request #FWB: Cat I #ID:  GBS negative #MOF: Breast #MOC: Declined #Circ:  Yes  Sydney Shropshire, MD  09/25/2023, 8:49 AM

## 2023-09-25 NOTE — Anesthesia Preprocedure Evaluation (Addendum)
 Anesthesia Evaluation  Patient identified by MRN, date of birth, ID band Patient awake    Reviewed: Allergy & Precautions, NPO status , Patient's Chart, lab work & pertinent test results  Airway Mallampati: II  TM Distance: >3 FB Neck ROM: Full    Dental no notable dental hx.    Pulmonary    Pulmonary exam normal        Cardiovascular negative cardio ROS  Rhythm:Regular Rate:Normal     Neuro/Psych   Anxiety Depression       GI/Hepatic negative GI ROS, Neg liver ROS,,,  Endo/Other  negative endocrine ROS    Renal/GU negative Renal ROS     Musculoskeletal   Abdominal Normal abdominal exam  (+)   Peds  Hematology negative hematology ROS (+)   Anesthesia Other Findings   Reproductive/Obstetrics (+) Pregnancy                             Anesthesia Physical Anesthesia Plan  ASA: 2  Anesthesia Plan: Epidural   Post-op Pain Management:    Induction:   PONV Risk Score and Plan: 2 and Treatment may vary due to age or medical condition  Airway Management Planned: Natural Airway  Additional Equipment: None  Intra-op Plan:   Post-operative Plan:   Informed Consent: I have reviewed the patients History and Physical, chart, labs and discussed the procedure including the risks, benefits and alternatives for the proposed anesthesia with the patient or authorized representative who has indicated his/her understanding and acceptance.     Dental advisory given  Plan Discussed with:   Anesthesia Plan Comments:        Anesthesia Quick Evaluation

## 2023-09-26 ENCOUNTER — Encounter (HOSPITAL_COMMUNITY): Payer: Self-pay | Admitting: Family Medicine

## 2023-09-26 DIAGNOSIS — O480 Post-term pregnancy: Secondary | ICD-10-CM

## 2023-09-26 DIAGNOSIS — Z3A41 41 weeks gestation of pregnancy: Secondary | ICD-10-CM

## 2023-09-26 DIAGNOSIS — O99344 Other mental disorders complicating childbirth: Secondary | ICD-10-CM

## 2023-09-26 LAB — RPR: RPR Ser Ql: NONREACTIVE

## 2023-09-26 MED ORDER — IBUPROFEN 600 MG PO TABS
600.0000 mg | ORAL_TABLET | Freq: Four times a day (QID) | ORAL | Status: DC
  Administered 2023-09-26 – 2023-09-28 (×9): 600 mg via ORAL
  Filled 2023-09-26 (×10): qty 1

## 2023-09-26 MED ORDER — DIBUCAINE (PERIANAL) 1 % EX OINT: 1.0000 | TOPICAL_OINTMENT | CUTANEOUS | Status: DC | PRN

## 2023-09-26 MED ORDER — ACETAMINOPHEN 325 MG PO TABS
650.0000 mg | ORAL_TABLET | ORAL | Status: DC | PRN
  Administered 2023-09-26: 650 mg via ORAL
  Filled 2023-09-26: qty 2

## 2023-09-26 MED ORDER — SODIUM CHLORIDE 0.9 % IV SOLN
250.0000 mL | INTRAVENOUS | Status: DC | PRN
Start: 2023-09-26 — End: 2023-09-28

## 2023-09-26 MED ORDER — BENZOCAINE-MENTHOL 20-0.5 % EX AERO
1.0000 | INHALATION_SPRAY | CUTANEOUS | Status: DC | PRN
  Administered 2023-09-26 – 2023-09-28 (×2): 1 via TOPICAL
  Filled 2023-09-26 (×3): qty 56

## 2023-09-26 MED ORDER — ZOLPIDEM TARTRATE 5 MG PO TABS: 5.0000 mg | ORAL_TABLET | Freq: Every evening | ORAL | Status: DC | PRN

## 2023-09-26 MED ORDER — ONDANSETRON HCL 4 MG/2ML IJ SOLN: 4.0000 mg | INTRAMUSCULAR | Status: DC | PRN

## 2023-09-26 MED ORDER — PRENATAL MULTIVITAMIN CH
1.0000 | ORAL_TABLET | Freq: Every day | ORAL | Status: DC
  Administered 2023-09-26 – 2023-09-28 (×3): 1 via ORAL
  Filled 2023-09-26 (×3): qty 1

## 2023-09-26 MED ORDER — METHYLERGONOVINE MALEATE 0.2 MG/ML IJ SOLN: 0.2000 mg | Freq: Once | INTRAMUSCULAR | Status: AC

## 2023-09-26 MED ORDER — DIPHENHYDRAMINE HCL 25 MG PO CAPS: 25.0000 mg | ORAL_CAPSULE | Freq: Four times a day (QID) | ORAL | Status: DC | PRN

## 2023-09-26 MED ORDER — ONDANSETRON HCL 4 MG PO TABS: 4.0000 mg | ORAL_TABLET | ORAL | Status: DC | PRN

## 2023-09-26 MED ORDER — SENNOSIDES-DOCUSATE SODIUM 8.6-50 MG PO TABS
2.0000 | ORAL_TABLET | ORAL | Status: DC
  Administered 2023-09-26 – 2023-09-28 (×3): 2 via ORAL
  Filled 2023-09-26 (×3): qty 2

## 2023-09-26 MED ORDER — COCONUT OIL OIL
1.0000 | TOPICAL_OIL | Status: DC | PRN
  Administered 2023-09-26: 1 via TOPICAL

## 2023-09-26 MED ORDER — SODIUM CHLORIDE 0.9% FLUSH
3.0000 mL | Freq: Two times a day (BID) | INTRAVENOUS | Status: DC
  Administered 2023-09-26: 3 mL via INTRAVENOUS

## 2023-09-26 MED ORDER — METHYLERGONOVINE MALEATE 0.2 MG/ML IJ SOLN
INTRAMUSCULAR | Status: AC
  Administered 2023-09-26: 0.2 mg via INTRAMUSCULAR
  Filled 2023-09-26: qty 1

## 2023-09-26 MED ORDER — WITCH HAZEL-GLYCERIN EX PADS: 1.0000 | MEDICATED_PAD | CUTANEOUS | Status: DC | PRN

## 2023-09-26 MED ORDER — SIMETHICONE 80 MG PO CHEW: 80.0000 mg | CHEWABLE_TABLET | ORAL | Status: DC | PRN

## 2023-09-26 MED ORDER — SODIUM CHLORIDE 0.9% FLUSH: 3.0000 mL | INTRAVENOUS | Status: DC | PRN

## 2023-09-26 NOTE — Anesthesia Postprocedure Evaluation (Signed)
 Anesthesia Post Note  Patient: Sydney Woods  Procedure(s) Performed: AN AD HOC LABOR EPIDURAL     Patient location during evaluation: Mother Baby Anesthesia Type: Epidural Level of consciousness: awake, awake and alert and oriented Pain management: satisfactory to patient Vital Signs Assessment: post-procedure vital signs reviewed and stable Respiratory status: spontaneous breathing, nonlabored ventilation and respiratory function stable Cardiovascular status: blood pressure returned to baseline and stable Postop Assessment: no headache, no backache, no apparent nausea or vomiting, able to ambulate, adequate PO intake and patient able to bend at knees Anesthetic complications: no   No notable events documented.  Last Vitals:  Vitals:   09/26/23 1310 09/26/23 1718  BP: 126/73 113/81  Pulse: 71 78  Resp: 16 18  Temp: 37.2 C 36.9 C  SpO2: 98% 97%    Last Pain:  Vitals:   09/26/23 1718  TempSrc: Oral  PainSc: 2    Pain Goal: Patients Stated Pain Goal: 2 (09/26/23 1718)                 Lamonte Hartt

## 2023-09-26 NOTE — Plan of Care (Signed)
 Continue to help with breastfeeding basics and encourage mom to change positions. Skin assessment checks every 2 hour for kpad use tolerated well no skin breakdown.

## 2023-09-26 NOTE — Progress Notes (Signed)
 Labor Progress Note  Sydney Woods is a 23 y.o. G4P0030 at [redacted]w[redacted]d presented for PDIOL.  S: feeling more pressure, agreeable to check  O:  BP 116/71   Pulse 80   Temp 97.9 F (36.6 C) (Oral)   Resp 18   Ht 5' 8 (1.727 m)   Wt 93.1 kg   LMP 12/12/2022 (Exact Date)   SpO2 98%   BMI 31.22 kg/m  EFM:130bpm/Moderate variability/ 15x15 accels/ None decels CAT: 1 Toco: regular, every 3 minutes   CVE: Dilation: 8 Effacement (%): 90 Station: 0 Presentation: Vertex Exam by:: Glennon Counts, RN   A&P: 23 y.o. [phone] [redacted]w[redacted]d  here for PDIOL as above  #Labor: Progressing well and finally more comfortable after second epidural.  Having some labial swelling that nursing will begin icing and apply vasaline and lidocaine  gel.  #Pain: Epidural #FWB: CAT 1 #GBS negative  Circumcision Consent  Discussed with mom at bedside about circumcision.   Circumcision is a surgery that removes the skin that covers the tip of the penis, called the foreskin. Circumcision is usually done when a boy is between 88 and 6 days old, sometimes up to 44-20 weeks old.  The most common reasons boys are circumcised include for cultural/religious beliefs or for parental preference (potentially easier to clean, so baby looks like daddy, etc).  There may be some medical benefits for circumcision:   Circumcised boys seem to have slightly lower rates of: ? Urinary tract infections (per the American Academy of Pediatrics an uncircumcised boy has a 1/100 chance of developing a UTI in the first year of life, a circumcised boy at a 09/998 chance of developing a UTI in the first year of life- a 10% reduction) ? Penis cancer (typically rare- an uncircumcised female has a 1 in 100,000 chance of developing cancer of the penis) ? Sexually transmitted infection (in endemic areas, including HIV, HPV and Herpes- circumcision does NOT protect against gonorrhea, chlamydia, trachomatis, or syphilis) ? Phimosis: a condition where  that makes retraction of the foreskin over the glans impossible (0.4 per 1000 boys per year or 0.6% of boys are affected by their 15th birthday)  Boys and men who are not circumcised can reduce these extra risks by: ? Cleaning their penis well ? Using condoms during sex  What are the risks of circumcision?  As with any surgical procedure, there are risks and complications. In circumcision, complications are rare and usually minor, the most common being: ? Bleeding- risk is reduced by holding each clamp for 30 seconds prior to a cut being made, and by holding pressure after the procedure is done ? Infection- the penis is cleaned prior to the procedure, and the procedure is done under sterile technique ? Damage to the urethra or amputation of the penis  How is circumcision done in baby boys?  The baby will be placed on a special table and the legs restrained for their safety. Numbing medication is injected into the penis, and the skin is cleansed with betadine to decrease the risk of infection.   What to expect:  The penis will look red and raw for 5-7 days as it heals. We expect scabbing around where the cut was made, as well as clear-pink fluid and some swelling of the penis right after the procedure. If your baby's circumcision starts to bleed or develops pus, please contact your pediatrician immediately.  All questions were answered and mother consented.  Augustin JAYSON Slade, MD FMOB Fellow, Faculty practice [facility],  Center for [facility] 09/26/23  12:46 AM

## 2023-09-26 NOTE — Plan of Care (Signed)
 Tolerating breast feeding well. Coconut oil given to mom for nipple care. Dermoplast being used for edema and tear repair from laceration.

## 2023-09-26 NOTE — Discharge Summary (Signed)
 Postpartum Discharge Summary      Patient Name: Sydney Woods DOB: August 15, 2001 MRN: [id number]  Date of admission: 09/25/2023 Delivery date:09/26/2023 Delivering provider: CHUBB, CASEY C Date of discharge: 09/28/2023  Admitting diagnosis: Post term pregnancy over 40 weeks [O48.0] Intrauterine pregnancy: [redacted]w[redacted]d     Secondary diagnosis:  Principal Problem:   Post term pregnancy over 40 weeks  Additional problems: none    Discharge diagnosis: Term Pregnancy Delivered                                              Post partum procedures: none Augmentation: AROM, Pitocin , Cytotec , and IP Foley Complications: None  Hospital course: Induction of Labor With Vaginal Delivery   23 y.o. yo [phone] at [redacted]w[redacted]d was admitted to the hospital 09/25/2023 for induction of labor.  Indication for induction: Postdates.  Patient had an labor course that was uncomplicated. Membrane Rupture Time/Date: 2:34 PM,09/25/2023  Delivery Method:Vaginal, Spontaneous Operative Delivery:N/A Episiotomy: None Lacerations:  Labial Details of delivery can be found in separate delivery note.  Patient had a postpartum course complicated by nothing. Patient is discharged home 09/28/23.  Newborn Data: Birth date:09/26/2023 Birth time:5:34 AM Gender:Female Living status:Living Apgars:7 ,9  Weight:3750 g  Magnesium Sulfate received: No BMZ received: No Rhophylac:N/A MMR:N/A T-DaP:Given prenatally Flu: declined Transfusion:No  Immunizations received: Immunization History  Administered Date(s) Administered   DTaP 01/15/1999, 04/14/1999, 02/05/2004, 08/22/2004   HIB (PRP-T) 01/15/1999, 04/14/1999   Hepatitis B, PED/ADOLESCENT 03-12-99, 01/15/1999, 02/05/2004   IPV 01/15/1999, 04/14/1999, 02/05/2004   Influenza,inj,Quad PF,6+ Mos 07/02/2015   MMR 02/05/2004, 03/11/2004   Meningococcal Conjugate 04/30/2015   Tdap 04/24/2009, 07/21/2023   Varicella 02/05/2004    Physical exam  Vitals:   09/27/23 0616 09/27/23  1601 09/27/23 2024 09/28/23 0540  BP: 111/67 112/68 119/69 (!) 116/57  Pulse: 93 95 81 77  Resp:  19  17  Temp: 98.5 F (36.9 C) 98 F (36.7 C)  98 F (36.7 C)  TempSrc: Oral Oral Oral Oral  SpO2: 98%   100%  Weight:      Height:       General: alert, cooperative, and no distress Lochia: appropriate Uterine Fundus: firm Incision: N/A DVT Evaluation: No cords or calf tenderness. Labs: Lab Results  Component Value Date   WBC 13.4 (H) 09/27/2023   HGB 9.7 (L) 09/27/2023   HCT 29.2 (L) 09/27/2023   MCV 94.5 09/27/2023   PLT 244 09/27/2023      Latest Ref Rng & Units 08/23/2023   12:26 PM  CMP  Glucose 70 - 99 mg/dL 99   BUN 6 - 20 mg/dL 7   Creatinine 9.42 - 8.99 mg/dL 9.39   Sodium 865 - 855 mmol/L 139   Potassium 3.5 - 5.2 mmol/L 3.7   Chloride 96 - 106 mmol/L 106   CO2 20 - 29 mmol/L 18   Calcium 8.7 - 10.2 mg/dL 8.7   Total Protein 6.0 - 8.5 g/dL 6.3   Total Bilirubin 0.0 - 1.2 mg/dL <9.7   Alkaline Phos 44 - 121 IU/L 100   AST 0 - 40 IU/L 20   ALT 0 - 32 IU/L 10    Edinburgh Score:    09/26/2023    8:10 AM  Edinburgh Postnatal Depression Scale Screening Tool  I have been able to laugh and see the funny side of things. 0  I have looked forward with enjoyment to things. 0  I have blamed myself unnecessarily when things went wrong. 1  I have been anxious or worried for no good reason. 2  I have felt scared or panicky for no good reason. 0  Things have been getting on top of me. 1  I have been so unhappy that I have had difficulty sleeping. 0  I have felt sad or miserable. 0  I have been so unhappy that I have been crying. 0  The thought of harming myself has occurred to me. 0  Edinburgh Postnatal Depression Scale Total 4   No data recorded  After visit meds:  Allergies as of 09/28/2023       Reactions   Tomato Flavor [flavoring Agent] Other (See Comments)   Bumps on tongue   Citrus Other (See Comments)   Bumps on tongue         Medication List      STOP taking these medications    doxylamine  (Sleep) 25 MG tablet Commonly known as: UNISOM    ondansetron  4 MG disintegrating tablet Commonly known as: Zofran  ODT       TAKE these medications    acetaminophen  325 MG tablet Commonly known as: Tylenol  Take 2 tablets (650 mg total) by mouth every 4 (four) hours as needed (for pain scale < 4).   Blood Pressure Kit Devi 1 Device by Does not apply route as needed.   ferrous gluconate  324 MG tablet Commonly known as: FERGON Take 1 tablet (324 mg total) by mouth daily with breakfast.   ibuprofen  600 MG tablet Commonly known as: ADVIL  Take 1 tablet (600 mg total) by mouth every 6 (six) hours.   PRENATAL VITAMIN PO Take by mouth.         Discharge home in stable condition Infant Feeding: Breast Infant Disposition:home with mother Discharge instruction: per After Visit Summary and Postpartum booklet. Activity: Advance as tolerated. Pelvic rest for 6 weeks.  Diet: routine diet Future Appointments:No future appointments. Follow up Visit:  Follow-up Information     [facility] Follow up.   Why: 4-6 wks Contact information: [place]                Message to [facility] 1/12  Please schedule this patient for a In person postpartum visit in 4 weeks with the following provider: Any provider. Additional Postpartum F/U:Postpartum Depression checkup  High risk pregnancy complicated by:  hx of recurrent pregnancy loss Delivery mode:  Vaginal, Spontaneous Anticipated Birth Control:   declined     09/28/2023 Devaughn KATHEE Ban, MD

## 2023-09-26 NOTE — Plan of Care (Signed)
 VSS Tolerating postpartum care well and progressing well.

## 2023-09-26 NOTE — Lactation Note (Signed)
 This note was copied from a baby's chart. Lactation Consultation Note  Patient Name: Sydney Woods Date: 09/26/2023 Age:25 hours  Reason for consult: Initial assessment;Primapara;1st time breastfeeding;Term;Mother's request;Breastfeeding assistance  P1, [redacted]w[redacted]d  Initial LC visit to see P1 mother with term baby Sydney. Assisted mother with breastfeeding. Baby placed to breast in football hold and basic breastfeeding education reviewed. Baby eager to latch but he is latching shallow and pinching mother's nipple. She was taught how to break the latch. Baby was able to latch deeper and non-painfully. Mother was taught how to do alternate breast compression.   Mother encouraged to latch baby with feeding cues, place baby skin to skin if not latching, and call for assistance with breastfeeding as needed. Anticipate as baby approaches 24 hours of age, baby will breastfeed more often 8-12 plus times in 24 hours and may cluster feed.    Mom made aware of O/P services, breastfeeding support groups, and our phone # for post-discharge questions.       Maternal Data Has patient been taught Hand Expression?: Yes Does the patient have breastfeeding experience prior to this delivery?: No  Feeding Mother's Current Feeding Choice: Breast Milk  LATCH Score Latch: Repeated attempts needed to sustain latch, nipple held in mouth throughout feeding, stimulation needed to elicit sucking reflex.  Audible Swallowing: A few with stimulation  Type of Nipple: Everted at rest and after stimulation  Comfort (Breast/Nipple): Soft / non-tender  Hold (Positioning): Assistance needed to correctly position infant at breast and maintain latch.  LATCH Score: 7   Lactation Tools Discussed/Used Tools: Pump Breast pump type: Manual (manual pump given by RN)  Interventions Interventions: Breast feeding basics reviewed;Assisted with latch;Skin to skin;Hand express;Breast compression;Adjust position;Support  pillows;Position options;Education;LC Services brochure  Discharge    Consult Status Consult Status: Follow-up Date: 09/27/23 Follow-up type: In-patient    Joshua Line M 09/26/2023, 2:25 PM

## 2023-09-27 LAB — CBC
HCT: 29.2 % — ABNORMAL LOW (ref 36.0–46.0)
Hemoglobin: 9.7 g/dL — ABNORMAL LOW (ref 12.0–15.0)
MCH: 31.4 pg (ref 26.0–34.0)
MCHC: 33.2 g/dL (ref 30.0–36.0)
MCV: 94.5 fL (ref 80.0–100.0)
Platelets: 244 10*3/uL (ref 150–400)
RBC: 3.09 MIL/uL — ABNORMAL LOW (ref 3.87–5.11)
RDW: 14.3 % (ref 11.5–15.5)
WBC: 13.4 10*3/uL — ABNORMAL HIGH (ref 4.0–10.5)
nRBC: 0 % (ref 0.0–0.2)

## 2023-09-27 MED ORDER — FERROUS SULFATE 325 (65 FE) MG PO TABS
325.0000 mg | ORAL_TABLET | ORAL | Status: DC
  Administered 2023-09-27: 325 mg via ORAL
  Filled 2023-09-27: qty 1

## 2023-09-27 NOTE — Progress Notes (Signed)
 CSW received consult for hx of Anxiety, Depression and per chart review SDOH determined housing struggles.  CSW met with MOB to offer support and complete assessment. CSW entered the room, introduced herself and explained the reason for the visit. MOB presented bonding/holding the infant and was polite, easy to engage, receptive to meeting with CSW, and appeared forthcoming.  CSW inquired about MOB's mental health history. MOB reported being diagnosed with anxiety and depression one year ago or more due to her miscarriage. MOB denied any/all mental health history prior to her miscarriage. MOB reported for support of her mental health she used coping strategies like taking care of self and praying/religious beliefs. MOB reported her supports as FOB, FOB family and her family. CSW provided education regarding the baby blues period vs. perinatal mood disorders, discussed treatment and gave resources for mental health follow up if concerns arise.  CSW recommends self-evaluation during the postpartum time period using the New Mom Checklist from Postpartum Progress and encouraged MOB to contact a medical professional if symptoms are noted at any time.    CSW asked MOB has she selected a pediatrician for the infant's follow up visits; MOB said [facility].  MOB reported having all essential items for the infant including a carseat, bassinet and crib for safe sleeping. CSW provided review of Sudden Infant Death Syndrome (SIDS) precautions.   CSW asked MOB about her housing issues. MOB reported at the beginning of her pregnancy she had recently moved into her apartment and was experiencing issues with paying her bills; however she denied any current issues.  CSW identifies no further need for intervention and no barriers to discharge at this time.   Rosina Molt, LCSWA Clinical Social Worker [phone]     CSW provided review of Sudden Infant Death Syndrome  (SIDS) precautions.   CSW identifies no further need for intervention and no barriers to discharge at this time.

## 2023-09-27 NOTE — Progress Notes (Signed)
 POSTPARTUM PROGRESS NOTE  Subjective: Sydney Woods is a 23 y.o. [id number] PPD#1 s/p SVD at [redacted]w[redacted]d.  She reports she doing well. No acute events overnight. She denies any problems with ambulating, voiding or po intake. Denies nausea or vomiting. Pain is well controlled. Using kpad for back pain. Lochia is minimal.  Objective: Blood pressure 111/67, pulse 93, temperature 98.5 F (36.9 C), temperature source Oral, resp. rate 18, height 5' 8 (1.727 m), weight 93.1 kg, last menstrual period 12/12/2022, SpO2 98%, unknown if currently breastfeeding.  Physical Exam:  General: alert, cooperative and no distress Chest: no respiratory distress Abdomen: soft, non-tender  Uterine Fundus: firm and at level of umbilicus Extremities: No calf swelling or tenderness  No LE edema  Recent Labs    09/25/23 0809 09/27/23 0457  HGB 11.4* 9.7*  HCT 33.5* 29.2*    Assessment/Plan: Illeana Edick is a 23 y.o. [id number] PPD#1 s/p SVD at [redacted]w[redacted]d.  Routine Postpartum Care: Doing well, pain overall well-controlled. Kpad for back pain tolerated well, cont NSAIDs -- Continue routine care and lactation support PRN -- Contraception: Withdrawal method -- Feeding: Breastfeeding. Tolerating feeding well. Coconut oil given for nipple care.  Anemia: HgB appropriate, clinically significant  started PO iron  Dispo: Plan for discharge tomorrow pending Pediatric clearance of baby.  Creighton Creighton LABOR, Conservator, Museum/gallery, [facility] [employer] 09/27/2023 6:34 AM   Attestation of Supervision of Student:  I confirm that I have verified the information documented in the medical student's note and that I have also personally performed the history, physical exam and all medical decision making activities.  I have verified that all services and findings are accurately documented in this student's note; and I agree with management and plan as outlined in the documentation. I have also made any necessary editorial  changes.   Alain Sor, MD Center for [facility], [facility] 09/27/2023 7:08 AM

## 2023-09-28 MED ORDER — ACETAMINOPHEN 325 MG PO TABS: 650.0000 mg | ORAL_TABLET | ORAL | 1 refills | Status: DC | PRN

## 2023-09-28 MED ORDER — IBUPROFEN 600 MG PO TABS: 600.0000 mg | ORAL_TABLET | Freq: Four times a day (QID) | ORAL | 0 refills | Status: DC

## 2023-09-28 NOTE — Lactation Note (Signed)
 This note was copied from a baby's chart. Lactation Consultation Note  Patient Name: Sydney Woods Date: 09/28/2023 Age:25 hours Reason for consult: Follow-up assessment;Primapara;1st time breastfeeding;Term;Infant weight loss (8 % weight loss) LC reviewed steps for latching, BF D/C teaching and the [facility] resources.  LC checked the flange and mom has a #21 F and #24 F for D/C .  Per mom breast are filling .  LC reviewed and updated the doc flow sheets and they correlate with the weight loss. LC reminded mom to feed with cues and not to go over  3 hours without feeding.   Maternal Data Has patient been taught Hand Expression?: Yes (per mom breast are filling and LC reviewed hand expressing, good flow)  Feeding Mother's Current Feeding Choice: Breast Milk and Formula (due to weight loss) Nipple Type: Nfant Standard Flow (white)  LATCH Score - 7-8    Lactation Tools Discussed/Used Tools: Pump;Flanges Flange Size: 21;24;Other (comment) (LC rechecked the flange size and the #21 F was a better fit until the milk comes in) Breast pump type: Manual Pump Education: Milk Storage;Setup, frequency, and cleaning Pumping frequency: LC recommended prior to every feeding until milk comes in steps for latching - breast massage, hand express, pre-pump with the hand pump and reverse pressure . LC reviewed with mom  Interventions Interventions: Breast feeding basics reviewed;Reverse pressure;DEBP;Education;LC Services brochure;CDC Guidelines for Breast Pump Cleaning  Discharge Discharge Education: Engorgement and breast care Pump: Manual (mom's medicaid not elible with [employer]ork pump , aware she can call [facility]) WIC Program: Yes  Consult Status Consult Status: Complete Date: 09/28/23    Rollene Jenkins Fiedler 09/28/2023, 8:52 AM

## 2023-10-09 ENCOUNTER — Telehealth (HOSPITAL_COMMUNITY): Payer: Self-pay

## 2023-10-09 NOTE — Telephone Encounter (Signed)
10/09/2023 1637  Name: Sydney Woods MRN: [id number] DOB: December 12, 2000  Reason for Call:  Transition of Care Hospital Discharge Call  Contact Status: Patient Contact Status: Unable to contact (No voicemail option.)  Language assistant needed:          Follow-Up Questions:    Inocente Salles Postnatal Depression Scale:  In the Past 7 Days:    PHQ2-9 Depression Scale:     Discharge Follow-up:    Post-discharge interventions: NA  Signature  Signe Colt

## 2023-11-01 ENCOUNTER — Telehealth: Payer: Self-pay | Admitting: Family Medicine

## 2023-11-01 NOTE — Telephone Encounter (Signed)
 [facility] Nurse called, had a home visit with patient this past Friday,  said every morning when Sydney Woods wake up her whole body hurts and sometime the pain last a long time.

## 2023-11-02 NOTE — Telephone Encounter (Signed)
 Called pt and pt reports that her body was hurting but has since gotten better.  Pt asked when her pp visit is scheduled as she has not received a call.  I informed pt that she has an appt scheduled on 11/10/23.  I encouraged pt to think about getting tested for flu.  Pt verbalized understanding with no further questions.   Leonette Nutting

## 2023-11-10 ENCOUNTER — Ambulatory Visit: Payer: Medicaid Other | Admitting: Obstetrics and Gynecology

## 2023-12-16 ENCOUNTER — Encounter: Payer: Self-pay | Admitting: Emergency Medicine

## 2023-12-16 ENCOUNTER — Ambulatory Visit
Admission: EM | Admit: 2023-12-16 | Discharge: 2023-12-16 | Disposition: A | Discharge status: Home / Self Care | Attending: Internal Medicine | Admitting: Internal Medicine

## 2023-12-16 DIAGNOSIS — A084 Viral intestinal infection, unspecified: Secondary | ICD-10-CM | POA: Diagnosis not present

## 2023-12-16 MED ORDER — ONDANSETRON 4 MG PO TBDP
4.0000 mg | ORAL_TABLET | Freq: Three times a day (TID) | ORAL | 0 refills | Status: DC | PRN
Start: 2023-12-16 — End: 2024-08-04

## 2023-12-16 MED ORDER — ONDANSETRON 4 MG PO TBDP
4.0000 mg | ORAL_TABLET | Freq: Once | ORAL | Status: AC
  Administered 2023-12-16: 4 mg via ORAL

## 2023-12-16 NOTE — ED Provider Notes (Signed)
 [facility]    CSN: [id number] Arrival date & time: 12/16/23  1141      History   Chief Complaint Chief Complaint  Patient presents with   Emesis   Fatigue   Abdominal Pain    HPI Sydney Woods is a 23 y.o. female.   Patient presents to urgent care for evaluation of nausea, vomiting, and diarrhea that started this morning abruptly upon waking.  She woke up vomiting and has had 2-3 episodes of nonbloody/nonbilious emesis in the last 6 or 7 hours.  Additionally reports diarrhea.  No blood or mucus in output.  Denies abdominal pain, dizziness, fever, chills, body aches, cough, congestion, and sore throat.  No rash.  No recent sick contacts with similar symptoms.  Last menstrual cycle was 3 weeks ago (recent pregnancy, gave birth on September 26, 2023 vaginally).  No recent antibiotic or steroid use.  She has not attempted use of any over-the-counter medications to help with symptoms PTA.   Emesis Associated symptoms: abdominal pain   Abdominal Pain Associated symptoms: vomiting     Past Medical History:  Diagnosis Date   Anxiety    Chlamydia    Depression    Dyspnea    SOB WHEN GETS NERVOUS FOR 4 DAYS LAST TIME 08-08-2019   History of chlamydia 04/13/2017   03/2017 - treated in ED     PID (pelvic inflammatory disease)    Recurrent pregnancy loss    Syncope    NONE RECENT    Patient Active Problem List   Diagnosis Date Noted   Post term pregnancy over 40 weeks 09/25/2023   Carrier of spinal muscular atrophy 03/18/2023   Supervision of high risk pregnancy, antepartum 02/24/2023   Anxiety 02/24/2023   Depression 02/24/2023   Hearing loss 11/14/2021   Recurrent pregnancy loss 09/24/2020    Past Surgical History:  Procedure Laterality Date   DILATION AND EVACUATION N/A 08/09/2019   Procedure: DILATATION AND EVACUATION;  Surgeon: Patton Salles, MD;  Location: [facility];  Service: Gynecology;  Laterality: N/A;    OB History      Gravida  4   Para  1   Term  1   Preterm  0   AB  3   Living  1      SAB  3   IAB  0   Ectopic  0   Multiple  0   Live Births  1            Home Medications    Prior to Admission medications   Medication Sig Start Date End Date Taking? Authorizing Provider  ondansetron (ZOFRAN-ODT) 4 MG disintegrating tablet Take 1 tablet (4 mg total) by mouth every 8 (eight) hours as needed for nausea or vomiting. 12/16/23  Yes Carlisle Beers, FNP  acetaminophen (TYLENOL) 325 MG tablet Take 2 tablets (650 mg total) by mouth every 4 (four) hours as needed (for pain scale < 4). 09/28/23   Wouk, Wilfred Curtis, MD  Blood Pressure Monitoring (BLOOD PRESSURE KIT) DEVI 1 Device by Does not apply route as needed. Patient not taking: Reported on 05/25/2023 02/24/23   Milas Hock, MD  ferrous gluconate (FERGON) 324 MG tablet Take 1 tablet (324 mg total) by mouth daily with breakfast. 08/24/23   Sue Lush, FNP  ibuprofen (ADVIL) 600 MG tablet Take 1 tablet (600 mg total) by mouth every 6 (six) hours. 09/28/23   Wouk, Wilfred Curtis, MD  Prenatal Vit-Fe  Fumarate-FA (PRENATAL VITAMIN PO) Take by mouth.    [provider]    Family History Family History  Problem Relation Age of Onset   Cancer Mother        pancreatic   Pancreatitis Mother    Heart disease Mother    Pancreatic cancer Mother    Cancer Father        leukemia   Diabetes Father    Leukemia Father    Healthy Brother    Alzheimer's disease Maternal Grandmother     Social History Social History   Tobacco Use   Smoking status: Never   Smokeless tobacco: Never  Vaping Use   Vaping status: Never Used  Substance Use Topics   Alcohol use: Not Currently    Comment: socially   Drug use: Not Currently    Comment: 02/24/23 last used when found out pregnant     Allergies   Tomato flavor [flavoring agent (non-screening)] and Citrus   Review of Systems Review of Systems  Gastrointestinal:   Positive for abdominal pain and vomiting.  Per HPI   Physical Exam Triage Vital Signs ED Triage Vitals  Encounter Vitals Group     BP 12/16/23 1211 102/66     Systolic BP Percentile --      Diastolic BP Percentile --      Pulse Rate 12/16/23 1211 85     Resp 12/16/23 1211 20     Temp 12/16/23 1211 98.2 F (36.8 C)     Temp Source 12/16/23 1211 Oral     SpO2 12/16/23 1211 95 %     Weight --      Height --      Head Circumference --      Peak Flow --      Pain Score 12/16/23 1212 0     Pain Loc --      Pain Education --      Exclude from Growth Chart --    No data found.  Updated Vital Signs BP 102/66 (BP Location: Right Arm)   Pulse 85   Temp 98.2 F (36.8 C) (Oral)   Resp 20   SpO2 95%   Breastfeeding Yes   Visual Acuity Right Eye Distance:   Left Eye Distance:   Bilateral Distance:    Right Eye Near:   Left Eye Near:    Bilateral Near:     Physical Exam Vitals and nursing note reviewed.  Constitutional:      Appearance: She is not ill-appearing or toxic-appearing.  HENT:     Head: Normocephalic and atraumatic.     Right Ear: Hearing and external ear normal.     Left Ear: Hearing and external ear normal.     Nose: Nose normal.     Mouth/Throat:     Lips: Pink.     Mouth: Mucous membranes are moist. No injury or oral lesions.     Dentition: Normal dentition.     Tongue: No lesions.     Pharynx: Oropharynx is clear. Uvula midline. No pharyngeal swelling, oropharyngeal exudate, posterior oropharyngeal erythema, uvula swelling or postnasal drip.     Tonsils: No tonsillar exudate.  Eyes:     General: Lids are normal. Vision grossly intact. Gaze aligned appropriately.     Extraocular Movements: Extraocular movements intact.     Conjunctiva/sclera: Conjunctivae normal.  Neck:     Trachea: Trachea and phonation normal.  Cardiovascular:     Rate and Rhythm: Normal rate and regular rhythm.  Heart sounds: Normal heart sounds, S1 normal and S2 normal.   Pulmonary:     Effort: Pulmonary effort is normal. No respiratory distress.     Breath sounds: Normal breath sounds and air entry.  Abdominal:     General: Bowel sounds are normal.     Palpations: Abdomen is soft.     Tenderness: There is no abdominal tenderness. There is no right CVA tenderness, left CVA tenderness or guarding.  Musculoskeletal:     Cervical back: Neck supple.  Lymphadenopathy:     Cervical: No cervical adenopathy.  Skin:    General: Skin is warm and dry.     Capillary Refill: Capillary refill takes less than 2 seconds.     Findings: No rash.  Neurological:     General: No focal deficit present.     Mental Status: She is alert and oriented to person, place, and time. Mental status is at baseline.     Cranial Nerves: No dysarthria or facial asymmetry.  Psychiatric:        Mood and Affect: Mood normal.        Speech: Speech normal.        Behavior: Behavior normal.        Thought Content: Thought content normal.        Judgment: Judgment normal.      UC Treatments / Results  Labs (all labs ordered are listed, but only abnormal results are displayed) Labs Reviewed - No data to display  EKG   Radiology No results found.  Procedures Procedures (including critical care time)  Medications Ordered in UC Medications  ondansetron (ZOFRAN-ODT) disintegrating tablet 4 mg (4 mg Oral Given 12/16/23 1224)    Initial Impression / Assessment and Plan / UC Course  I have reviewed the triage vital signs and the nursing notes.  Pertinent labs & imaging results that were available during my care of the patient were reviewed by me and considered in my medical decision making (see chart for details).   1.  Viral gastroenteritis Evaluation suggests viral gastrointestinal illness etiology.  Patient nontoxic appearing with hemodynamically stable vital signs, abdominal exam without peritoneal signs/focal tenderness. Will manage this with antiemetic (Zofran) as needed,  OTC medicines as needed for discomfort/pain, increased fluids, and rest. Zofran 4 mg ODT given in clinic with improvement in nausea prior to discharge.  Patient able to tolerate ginger ale without vomiting prior to discharge. Liquid/bland diet initially, then increase diet as tolerated.   Counseled patient on potential for adverse effects with medications prescribed/recommended today, strict ER and return-to-clinic precautions discussed, patient verbalized understanding.    Final Clinical Impressions(s) / UC Diagnoses   Final diagnoses:  Viral gastroenteritis     Discharge Instructions      Your evaluation suggests that your symptoms are most likely due to viral stomach illness (gastroenteritis/"stomach bug") which will improve on its own with rest and fluids in the next few days.   Take zofran to help with nausea every 8 hours as needed. You may use over the counter medicines for aches and pains such as tylenol as needed.  Start sipping on liquids (broth, water, gatorade, etc). If you are able to keep liquids down without vomiting for 1-2 hours, you may eat bland foods like jello, pudding, applesauce, bananas, [place], and white toast. Once you can tolerate blands, you may return to normal diet.   Pedialyte or gatorolyte may help to prevent/fix dehydration due to vomiting and diarrhea.  Please follow up with  your primary care provider for further management. Return if you experience worsening or uncontrolled pain, inability to tolerate fluids by mouth, difficulty breathing, fevers 100.52F or greater, recurrent vomiting, or any other concerning symptoms.   ED Prescriptions     Medication Sig Dispense Auth. Provider   ondansetron (ZOFRAN-ODT) 4 MG disintegrating tablet Take 1 tablet (4 mg total) by mouth every 8 (eight) hours as needed for nausea or vomiting. 20 tablet Carlisle Beers, FNP      PDMP not reviewed this encounter.   Carlisle Beers, [place] 12/16/23 1239

## 2023-12-16 NOTE — ED Triage Notes (Signed)
 Pt c/o nausea, vomiting, and diarrhea that began early this morning.

## 2023-12-16 NOTE — Discharge Instructions (Signed)

## 2023-12-31 ENCOUNTER — Emergency Department (HOSPITAL_COMMUNITY)
Admission: EM | Admit: 2023-12-31 | Discharge: 2023-12-31 | Discharge status: Against Medical Advice | Attending: Emergency Medicine | Admitting: Emergency Medicine

## 2023-12-31 ENCOUNTER — Other Ambulatory Visit: Payer: Self-pay

## 2023-12-31 ENCOUNTER — Encounter (HOSPITAL_COMMUNITY): Payer: Self-pay | Admitting: *Deleted

## 2023-12-31 DIAGNOSIS — Z5321 Procedure and treatment not carried out due to patient leaving prior to being seen by health care provider: Secondary | ICD-10-CM | POA: Insufficient documentation

## 2023-12-31 DIAGNOSIS — R112 Nausea with vomiting, unspecified: Secondary | ICD-10-CM | POA: Insufficient documentation

## 2023-12-31 DIAGNOSIS — R197 Diarrhea, unspecified: Secondary | ICD-10-CM | POA: Diagnosis not present

## 2023-12-31 LAB — COMPREHENSIVE METABOLIC PANEL WITH GFR
ALT: 14 U/L (ref 0–44)
AST: 20 U/L (ref 15–41)
Albumin: 5.1 g/dL — ABNORMAL HIGH (ref 3.5–5.0)
Alkaline Phosphatase: 87 U/L (ref 38–126)
Anion gap: 17 — ABNORMAL HIGH (ref 5–15)
BUN: 9 mg/dL (ref 6–20)
CO2: 17 mmol/L — ABNORMAL LOW (ref 22–32)
Calcium: 10 mg/dL (ref 8.9–10.3)
Chloride: 107 mmol/L (ref 98–111)
Creatinine, Ser: 1.05 mg/dL — ABNORMAL HIGH (ref 0.44–1.00)
GFR, Estimated: >60 mL/min (ref >60)
Glucose, Bld: 100 mg/dL — ABNORMAL HIGH (ref 70–99)
Potassium: 3.8 mmol/L (ref 3.5–5.1)
Sodium: 141 mmol/L (ref 135–145)
Total Bilirubin: 0.9 mg/dL (ref 0.0–1.2)
Total Protein: 9.1 g/dL — ABNORMAL HIGH (ref 6.5–8.1)

## 2023-12-31 LAB — CBC WITH DIFFERENTIAL/PLATELET
Abs Immature Granulocytes: 0.09 10*3/uL — ABNORMAL HIGH (ref 0.00–0.07)
Basophils Absolute: 0 10*3/uL (ref 0.0–0.1)
Basophils Relative: 0 %
Eosinophils Absolute: 0.1 10*3/uL (ref 0.0–0.5)
Eosinophils Relative: 0 %
HCT: 47.6 % — ABNORMAL HIGH (ref 36.0–46.0)
Hemoglobin: 15.3 g/dL — ABNORMAL HIGH (ref 12.0–15.0)
Immature Granulocytes: 1 %
Lymphocytes Relative: 9 %
Lymphs Abs: 1.4 10*3/uL (ref 0.7–4.0)
MCH: 30.3 pg (ref 26.0–34.0)
MCHC: 32.1 g/dL (ref 30.0–36.0)
MCV: 94.3 fL (ref 80.0–100.0)
Monocytes Absolute: 1.5 10*3/uL — ABNORMAL HIGH (ref 0.1–1.0)
Monocytes Relative: 9 %
Neutro Abs: 13.2 10*3/uL — ABNORMAL HIGH (ref 1.7–7.7)
Neutrophils Relative %: 81 %
Platelets: 404 10*3/uL — ABNORMAL HIGH (ref 150–400)
RBC: 5.05 MIL/uL (ref 3.87–5.11)
RDW: 12 % (ref 11.5–15.5)
WBC: 16.3 10*3/uL — ABNORMAL HIGH (ref 4.0–10.5)
nRBC: 0 % (ref 0.0–0.2)

## 2023-12-31 LAB — URINALYSIS, ROUTINE W REFLEX MICROSCOPIC
Bilirubin Urine: 1 — AB
Glucose, UA: 100 mg/dL — AB
Ketones, ur: NEGATIVE mg/dL
Leukocytes,Ua: NEGATIVE
Nitrite: POSITIVE — AB
Protein, ur: >300 mg/dL — AB
Specific Gravity, Urine: 1.03 (ref 1.005–1.030)
pH: 5 (ref 5.0–8.0)

## 2023-12-31 LAB — LIPASE, BLOOD: Lipase: 26 U/L (ref 11–51)

## 2023-12-31 LAB — URINALYSIS, MICROSCOPIC (REFLEX)

## 2023-12-31 LAB — PREGNANCY, URINE: Preg Test, Ur: NEGATIVE

## 2023-12-31 MED ORDER — ONDANSETRON 4 MG PO TBDP
8.0000 mg | ORAL_TABLET | Freq: Once | ORAL | Status: AC
  Administered 2023-12-31: 8 mg via ORAL
  Filled 2023-12-31: qty 2

## 2023-12-31 NOTE — ED Triage Notes (Signed)
 Pt has been having nausea, vomiting and diarrhea since she woke up this am

## 2023-12-31 NOTE — ED Provider Triage Note (Signed)
 Emergency Medicine Provider Triage Evaluation Note  Sydney Woods , a 23 y.o. female  was evaluated in triage.  Pt complains of N, V, D.  Review of Systems  Positive: As above  Negative: Fever, hematemesis, bloody stools  Physical Exam  There were no vitals taken for this visit. Gen:   Awake, no distress   Resp:  Normal effort  MSK:   Moves extremities without difficulty  Other:  Abdomen nontender  Medical Decision Making  Medically screening exam initiated at 1:42 PM.  Appropriate orders placed.  Talulah Paluch was informed that the remainder of the evaluation will be completed by another provider, this initial triage assessment does not replace that evaluation, and the importance of remaining in the ED until their evaluation is complete.  Patient with above symptoms since this morning. Similar illness one week ago, seen at Urgent Care - was better after 1 day until this morning.    Mandy Second, PA-C 12/31/23 1343

## 2023-12-31 NOTE — ED Notes (Signed)
 Patient called 3x no answer-pulled OTF.

## 2024-01-04 ENCOUNTER — Telehealth: Payer: Self-pay

## 2024-01-04 NOTE — Transitions of Care (Post Inpatient/ED Visit) (Signed)
   01/04/2024  Name: Sydney Woods MRN: [id number] DOB: June 10, 2001  Today's TOC FU Call Status: Today's TOC FU Call Status:: Unsuccessful Call (1st Attempt) Unsuccessful Call (1st Attempt) Date: 01/04/24  Attempted to reach the patient regarding the most recent Inpatient/ED visit.  Follow Up Plan: Additional outreach attempts will be made to reach the patient to complete the Transitions of Care (Post Inpatient/ED visit) call.   Signature Aleyah Balik,CMA

## 2024-02-28 ENCOUNTER — Encounter: Payer: Self-pay | Admitting: Internal Medicine

## 2024-02-29 NOTE — Telephone Encounter (Signed)
 Sydney Woods  - is her job:  Fax :

## 2024-03-01 NOTE — Telephone Encounter (Signed)
 Form has been placed up front in Dr. Donnette Gal box

## 2024-08-04 ENCOUNTER — Emergency Department (HOSPITAL_COMMUNITY)

## 2024-08-04 ENCOUNTER — Encounter (HOSPITAL_COMMUNITY): Payer: Self-pay

## 2024-08-04 ENCOUNTER — Emergency Department (HOSPITAL_COMMUNITY)
Admission: EM | Admit: 2024-08-04 | Discharge: 2024-08-04 | Disposition: A | Discharge status: Home / Self Care | Attending: Emergency Medicine | Admitting: Emergency Medicine

## 2024-08-04 DIAGNOSIS — O209 Hemorrhage in early pregnancy, unspecified: Secondary | ICD-10-CM | POA: Diagnosis present

## 2024-08-04 DIAGNOSIS — O99111 Other diseases of the blood and blood-forming organs and certain disorders involving the immune mechanism complicating pregnancy, first trimester: Secondary | ICD-10-CM | POA: Insufficient documentation

## 2024-08-04 DIAGNOSIS — R197 Diarrhea, unspecified: Secondary | ICD-10-CM | POA: Diagnosis not present

## 2024-08-04 DIAGNOSIS — R112 Nausea with vomiting, unspecified: Secondary | ICD-10-CM

## 2024-08-04 DIAGNOSIS — D72829 Elevated white blood cell count, unspecified: Secondary | ICD-10-CM | POA: Insufficient documentation

## 2024-08-04 DIAGNOSIS — Z3A01 Less than 8 weeks gestation of pregnancy: Secondary | ICD-10-CM | POA: Insufficient documentation

## 2024-08-04 LAB — URINALYSIS, ROUTINE W REFLEX MICROSCOPIC
Bacteria, UA: NONE SEEN
Bilirubin Urine: NEGATIVE
Glucose, UA: NEGATIVE mg/dL
Ketones, ur: NEGATIVE mg/dL
Leukocytes,Ua: NEGATIVE
Nitrite: NEGATIVE
Protein, ur: 100 mg/dL — AB
Specific Gravity, Urine: 1.031 — ABNORMAL HIGH (ref 1.005–1.030)
pH: 5 (ref 5.0–8.0)

## 2024-08-04 LAB — LIPASE, BLOOD: Lipase: 24 U/L (ref 11–51)

## 2024-08-04 LAB — CBC
HCT: 40 % (ref 36.0–46.0)
Hemoglobin: 13 g/dL (ref 12.0–15.0)
MCH: 29.4 pg (ref 26.0–34.0)
MCHC: 32.5 g/dL (ref 30.0–36.0)
MCV: 90.5 fL (ref 80.0–100.0)
Platelets: 344 K/uL (ref 150–400)
RBC: 4.42 MIL/uL (ref 3.87–5.11)
RDW: 13.2 % (ref 11.5–15.5)
WBC: 12.1 K/uL — ABNORMAL HIGH (ref 4.0–10.5)
nRBC: 0 % (ref 0.0–0.2)

## 2024-08-04 LAB — COMPREHENSIVE METABOLIC PANEL WITH GFR
ALT: 10 U/L (ref 0–44)
AST: 17 U/L (ref 15–41)
Albumin: 4.4 g/dL (ref 3.5–5.0)
Alkaline Phosphatase: 58 U/L (ref 38–126)
Anion gap: 11 (ref 5–15)
BUN: 10 mg/dL (ref 6–20)
CO2: 21 mmol/L — ABNORMAL LOW (ref 22–32)
Calcium: 9.3 mg/dL (ref 8.9–10.3)
Chloride: 104 mmol/L (ref 98–111)
Creatinine, Ser: 0.81 mg/dL (ref 0.44–1.00)
GFR, Estimated: >60 mL/min (ref >60)
Glucose, Bld: 114 mg/dL — ABNORMAL HIGH (ref 70–99)
Potassium: 3.4 mmol/L — ABNORMAL LOW (ref 3.5–5.1)
Sodium: 136 mmol/L (ref 135–145)
Total Bilirubin: 0.7 mg/dL (ref 0.0–1.2)
Total Protein: 8.3 g/dL — ABNORMAL HIGH (ref 6.5–8.1)

## 2024-08-04 LAB — HCG, QUANTITATIVE, PREGNANCY: hCG, Beta Chain, Quant, S: 5191 m[IU]/mL — ABNORMAL HIGH (ref <5)

## 2024-08-04 LAB — HCG, SERUM, QUALITATIVE: Preg, Serum: POSITIVE — AB

## 2024-08-04 MED ORDER — LACTATED RINGERS IV BOLUS
1000.0000 mL | Freq: Once | INTRAVENOUS | Status: AC
  Administered 2024-08-04: 1000 mL via INTRAVENOUS

## 2024-08-04 MED ORDER — DICLEGIS 10-10 MG PO TBEC: 1.0000 | DELAYED_RELEASE_TABLET | Freq: Two times a day (BID) | ORAL | 0 refills | Status: AC | PRN

## 2024-08-04 MED ORDER — ONDANSETRON HCL 4 MG PO TABS: 4.0000 mg | ORAL_TABLET | Freq: Three times a day (TID) | ORAL | Status: AC | PRN

## 2024-08-04 MED ORDER — POTASSIUM CHLORIDE CRYS ER 20 MEQ PO TBCR
40.0000 meq | EXTENDED_RELEASE_TABLET | Freq: Once | ORAL | Status: AC
  Administered 2024-08-04: 40 meq via ORAL
  Filled 2024-08-04: qty 2

## 2024-08-04 MED ORDER — ONDANSETRON HCL 4 MG/2ML IJ SOLN
4.0000 mg | Freq: Once | INTRAMUSCULAR | Status: AC
  Administered 2024-08-04: 4 mg via INTRAVENOUS
  Filled 2024-08-04: qty 2

## 2024-08-04 NOTE — Discharge Instructions (Addendum)
 Sydney Woods  Thank you for allowing us  to take care of you today.  You came to the Emergency Department today because you have had 1 day of nausea, vomiting, diarrhea and abdominal pain.  You also had some spotting yesterday.  Your pregnancy test was positive, and your ultrasound shows that you are about 5 weeks and 3 days along, it does show some bleeding next to the pregnancy  1. Intrauterine gestational sac with yolk sac, consistent with gestational age  of [redacted] weeks and 3 days, without identified embryo. Findings likely reflect early  gestational age. Recommend follow-up imaging in 14 days along with serial  quantitative beta-HCG.  2. Moderate-sized subchorionic hematoma measuring approximately 3.4 cc.   You're feeling better after some fluids and antinausea medicine.  Your symptoms are likely combination of some morning sickness as well as a viral stomach infection.  We recommend plenty fluids, taking your prenatal vitamins.  We are prescribing you Diclegis , which is a antinausea medicine specifically for pregnancy, this 1 does not work for you, we also prescribing Zofran , which is another nausea medication that works for nausea and vomiting in general not just during pregnancy.  We have given you referral to follow-up with your OB/GYN for further management of your pregnancy.  You should have a repeat blood hormone level drawn in 2 to 5 days to check on the health of the pregnancy.  To-Do: 1. Please follow-up with your primary doctor within 1 - 2 weeks / as soon as possible.   Please return to the Emergency Department or call 911 if you experience have worsening of your symptoms, or do not get better, worsening vaginal bleeding, chest pain, shortness of breath, severe or significantly worsening pain, high fever, severe confusion, pass out or have any reason to think that you need emergency medical care.   We hope you feel better soon.   Mitzie Later, MD Department of Emergency  Medicine [facility]

## 2024-08-04 NOTE — ED Triage Notes (Addendum)
 Pt c/o generalized abdominal pain and n/v/d starting this morning.  Pain score 7/10.  Pt has not taken anything for symptoms.   After completion of triage, Pt requested a pregnancy test.  Pt made aware that one had already been ordered/was part of standing orders.

## 2024-08-04 NOTE — ED Provider Notes (Signed)
 [facility] AT [facility] Provider Note   CSN: [id number] Arrival date & time: 08/04/24  9246     History Chief Complaint  Patient presents with   Abdominal Pain   Emesis   Diarrhea    HPI: Sydney Woods is a 23 y.o. female with history pertinent for G5P1031, PID, who presents complaining of abdominal pain. Patient arrived via POV accompanied by father.  History provided by patient.  No interpreter required during this encounter.  Patient reports that she has had several hours of diffuse abdominal pain, nausea, vomiting, diarrhea, as well as chills.  Reports no known sick contacts.  Denies chest pain, shortness of breath, dysuria.  Reports that her LMP was approximately 4 weeks ago, and she began having spotting yesterday.  Patient's recorded medical, surgical, social, medication list and allergies were reviewed in the Snapshot window as part of the initial history.   Prior to Admission medications   Medication Sig Start Date End Date Taking? Authorizing Provider  acetaminophen  (TYLENOL ) 325 MG tablet Take 2 tablets (650 mg total) by mouth every 4 (four) hours as needed (for pain scale < 4). 09/28/23   Wouk, Devaughn Sayres, MD  Blood Pressure Monitoring (BLOOD PRESSURE KIT) DEVI 1 Device by Does not apply route as needed. Patient not taking: Reported on 05/25/2023 02/24/23   Cleatus Moccasin, MD  ferrous gluconate  [facility]) 324 MG tablet Take 1 tablet (324 mg total) by mouth daily with breakfast. 08/24/23   Delores Nidia CROME, FNP  ibuprofen  (ADVIL ) 600 MG tablet Take 1 tablet (600 mg total) by mouth every 6 (six) hours. 09/28/23   Wouk, Devaughn Sayres, MD  ondansetron  (ZOFRAN -ODT) 4 MG disintegrating tablet Take 1 tablet (4 mg total) by mouth every 8 (eight) hours as needed for nausea or vomiting. 12/16/23   Enedelia Dorna HERO, FNP  Prenatal Vit-Fe Fumarate-FA (PRENATAL VITAMIN PO) Take by mouth.    [provider]     Allergies: Tomato flavor  [flavoring agent (non-screening)] and Citrus   Review of Systems   ROS as per HPI  Physical Exam Updated Vital Signs BP (!) 131/91 (BP Location: Right Arm)   Pulse 78   Temp 97.8 F (36.6 C) (Oral)   Resp 15   Ht 5' 8 (1.727 m)   SpO2 100%   BMI 31.22 kg/m  Physical Exam Vitals and nursing note reviewed.  Constitutional:      General: She is not in acute distress.    Appearance: Normal appearance. She is well-developed.  HENT:     Head: Normocephalic and atraumatic.  Eyes:     Extraocular Movements: Extraocular movements intact.     Conjunctiva/sclera: Conjunctivae normal.  Cardiovascular:     Rate and Rhythm: Normal rate and regular rhythm.     Pulses: Normal pulses.     Heart sounds: No murmur heard. Pulmonary:     Effort: Pulmonary effort is normal. No respiratory distress.     Breath sounds: Normal breath sounds.  Abdominal:     Palpations: Abdomen is soft.     Tenderness: There is no abdominal tenderness.  Musculoskeletal:        General: No swelling.     Cervical back: Neck supple.  Skin:    General: Skin is warm and dry.     Capillary Refill: Capillary refill takes less than 2 seconds.  Neurological:     General: No focal deficit present.     Mental Status: She is alert and oriented to person,  place, and time.  Psychiatric:        Mood and Affect: Mood normal.     ED Course/ Medical Decision Making/ A&P    Procedures Procedures   Medications Ordered in ED Medications - No data to display  Medical Decision Making:   [name] is a 23 y.o. female who presents for abdominal pain, nausea, vomiting, diarrhea, vaginal bleeding as per above.  Physical exam is pertinent for no focal abnormalities.   The differential includes but is not limited to IUP, threatened miscarriage, inevitable miscarriage, ectopic pregnancy, gastroenteritis, AKI, emergent electrolyte derangement, intra-abdominal process.  Independent historian: None  External data  reviewed: Labs: reviewed prior labs for baseline.  Review of prior labs reveal that patient's blood type is O+, therefore do not feel that patient requires additional blood typing for RhoGAM today.  Initial Plan:  Screening labs including CBC and Metabolic panel to evaluate for infectious or metabolic etiology of disease.  Lipase in the setting of vomiting Screening hCG Serum quantitative hCG given screening hCG positive in the setting of vaginal bleeding Transvaginal ultrasound for pregnancy of unknown location Urinalysis with reflex culture ordered to evaluate for UTI or relevant urologic/nephrologic pathology.  Objective evaluation as below reviewed   Labs: Ordered, Independent interpretation, and Details: UA without UTI.  Serum pregnancy test negative.  CBC with mild nonspecific leukocytosis to 12.1, no anemia or thrombocytopenia.  CMP without AKI, emergent electrolyte derangement, emergent LFT abnormality  Radiology: Ordered, Independent interpretation, Details: ***, and All images reviewed independently. ***Agree with radiology report at this time.   No results found.  EKG/Medicine tests: Not indicated EKG Interpretation:                  Interventions: DeFrank, LR bolus, potassium repletion  See the EMR for full details regarding lab and imaging results.  Patient presents to the emergency [facility] for for nausea, vomiting, diarrhea, abdominal pain.  Patient's abdomen is soft and nontender on exam, given consolation of symptoms, do suspect gastroenteritis.  Doubt pancreatitis given lipase WNL.  No severe electrolyte derangements or AKI, patient does have mildly diminished bicarb indicative of early volume losses, therefore will give LR bolus.  CBC is mildly elevated, consistent with possible viral gastroenteritis or stress demargination in the setting of vomiting, however no significant leukocytosis to raise suspicion for acute intra-abdominal process particularly in the absence of  vital instability or fever.  Patient screening hCG was positive, therefore do feel that patient requires transvaginal ultrasound and serum quantitative hCG given she is having abdominal pain and vaginal bleeding.  Patient blood type has previously demonstrated to be O+, therefore RhoGAM and additional blood typing not indicated currently.  Doubt urologic source of pain given UA without UTI.  Will treat patient's symptoms with Zofran , LR bolus, additionally will provide potassium repletion  {LSCOPA:33420}  Discussion of management or test interpretations with external provider(s): ***  Risk Drugs:{LSDRUGS:33399} Treatment: {LSTREATMENT:33409} Surgery:{LSSURGERY:33410} Critical Care: ***  Disposition: {LSDISPO:33388}  MDM generated using voice dictation software and may contain dictation errors.  Please contact me for any clarification or with any questions.  Clinical Impression: No diagnosis found.   Data Unavailable   Final Clinical Impression(s) / ED Diagnoses Final diagnoses:  None    Rx / DC Orders ED Discharge Orders     None

## 2024-08-04 NOTE — ED Notes (Signed)
 Pt was given discharge instructions and verbalized understanding. Opportunity was given to ask questions.

## 2024-08-04 NOTE — ED Notes (Signed)
 Pt was given ginger ale and crackers, able to tolerate without Nausea and Vomiting.

## 2024-08-11 ENCOUNTER — Inpatient Hospital Stay (HOSPITAL_COMMUNITY)
Admission: AD | Admit: 2024-08-11 | Discharge: 2024-08-11 | Discharge status: Against Medical Advice | Attending: Obstetrics and Gynecology | Admitting: Obstetrics and Gynecology

## 2024-08-11 ENCOUNTER — Encounter (HOSPITAL_COMMUNITY): Payer: Self-pay | Admitting: *Deleted

## 2024-08-11 DIAGNOSIS — O4691 Antepartum hemorrhage, unspecified, first trimester: Secondary | ICD-10-CM | POA: Diagnosis present

## 2024-08-11 DIAGNOSIS — O418X10 Other specified disorders of amniotic fluid and membranes, first trimester, not applicable or unspecified: Secondary | ICD-10-CM

## 2024-08-11 DIAGNOSIS — Z3A01 Less than 8 weeks gestation of pregnancy: Secondary | ICD-10-CM | POA: Diagnosis not present

## 2024-08-11 DIAGNOSIS — O23591 Infection of other part of genital tract in pregnancy, first trimester: Secondary | ICD-10-CM | POA: Insufficient documentation

## 2024-08-11 DIAGNOSIS — O208 Other hemorrhage in early pregnancy: Secondary | ICD-10-CM | POA: Diagnosis not present

## 2024-08-11 DIAGNOSIS — B9689 Other specified bacterial agents as the cause of diseases classified elsewhere: Secondary | ICD-10-CM | POA: Insufficient documentation

## 2024-08-11 LAB — URINALYSIS, ROUTINE W REFLEX MICROSCOPIC
Bilirubin Urine: NEGATIVE
Glucose, UA: NEGATIVE mg/dL
Ketones, ur: NEGATIVE mg/dL
Leukocytes,Ua: NEGATIVE
Nitrite: NEGATIVE
Protein, ur: NEGATIVE mg/dL
Specific Gravity, Urine: 1.026 (ref 1.005–1.030)
pH: 6 (ref 5.0–8.0)

## 2024-08-11 LAB — WET PREP, GENITAL
Sperm: NONE SEEN
Trich, Wet Prep: NONE SEEN
WBC, Wet Prep HPF POC: <10 (ref <10)
Yeast Wet Prep HPF POC: NONE SEEN

## 2024-08-11 NOTE — MAU Provider Note (Cosign Needed Addendum)
 Chief Complaint:  Vaginal Bleeding   HPI   None     Sydney Woods is a 23 y.o. [id number] at [redacted]w[redacted]d who presents to maternity admissions reporting light vaginal bleeding. She reports that she has had light vaginal bleeding for about 1 week. She was evaluated at the ED for vaginal bleeding last week, US  showed IUGS and YS as well as subchorionic hematoma.  Pregnancy Course: Receives care at [facility] . Prenatal records reviewed.   Past Medical History:  Diagnosis Date   Anxiety    Chlamydia    Depression    Dyspnea    SOB WHEN GETS NERVOUS FOR 4 DAYS LAST TIME 08-08-2019   History of chlamydia 04/13/2017   03/2017 - treated in ED     PID (pelvic inflammatory disease)    Recurrent pregnancy loss    Syncope    NONE RECENT   OB History  Gravida Para Term Preterm AB Living  5 1 1  0 3 1  SAB IAB Ectopic Multiple Live Births  3 0 0 0 1    # Outcome Date GA Lbr Len/2nd Weight Sex Type Anes PTL Lv  5 Current           4 Term 09/26/23 [redacted]w[redacted]d  3750 g M Vag-Spont EPI  LIV  3 SAB 09/24/20 [redacted]w[redacted]d         2 SAB 02/04/20 [redacted]w[redacted]d         1 SAB 08/05/19 [redacted]w[redacted]d            Birth Comments: had D&C   Past Surgical History:  Procedure Laterality Date   DILATION AND EVACUATION N/A 08/09/2019   Procedure: DILATATION AND EVACUATION;  Surgeon: Cathlyn JAYSON Nikki Bobie FORBES, MD;  Location: [facility];  Service: Gynecology;  Laterality: N/A;   Family History  Problem Relation Age of Onset   Cancer Mother        pancreatic   Pancreatitis Mother    Heart disease Mother    Pancreatic cancer Mother    Cancer Father        leukemia   Diabetes Father    Leukemia Father    Healthy Brother    Alzheimer's disease Maternal Grandmother    Social History   Tobacco Use   Smoking status: Some Days    Types: Cigarettes    Passive exposure: Never   Smokeless tobacco: Never  Vaping Use   Vaping status: Never Used  Substance Use Topics   Alcohol use: Yes     Comment: socially   Drug use: Not Currently   Allergies  Allergen Reactions   Tomato Flavor [Flavoring Agent (Non-Screening)] Other (See Comments)    Bumps on tongue   Citrus Other (See Comments)    Bumps on tongue    Medications Prior to Admission  Medication Sig Dispense Refill Last Dose/Taking   Collagen-Vitamin C-Biotin (COLLAGEN PO) Take 2 tablets by mouth daily.      Doxylamine -Pyridoxine (DICLEGIS ) 10-10 MG TBEC Take 1 tablet by mouth 2 (two) times daily as needed (nausea). 60 tablet 0    ondansetron  (ZOFRAN ) 4 MG tablet Take 1 tablet (4 mg total) by mouth every 8 (eight) hours as needed for nausea or vomiting.       I have reviewed patient's Past Medical Hx, Surgical Hx, Family Hx, Social Hx, medications and allergies.   ROS  Pertinent items noted in HPI and remainder of comprehensive ROS otherwise negative.   PHYSICAL EXAM  Patient Vitals for the past 24 hrs:  BP Temp Pulse Resp Height Weight  08/11/24 1700 114/66 98.8 F (37.1 C) 72 (!) 183 5' 8 (1.727 m) 68.9 kg   Patient left AMA prior to examination by provider.  Labs: Results for orders placed or performed during the hospital encounter of 08/11/24 (from the past 24 hours)  Wet prep, genital     Status: Abnormal   Collection Time: 08/11/24  5:05 PM   Specimen: PATH Cytology Cervicovaginal Ancillary Only  Result Value Ref Range   Yeast Wet Prep HPF POC NONE SEEN NONE SEEN   Trich, Wet Prep NONE SEEN NONE SEEN   Clue Cells Wet Prep HPF POC PRESENT (A) NONE SEEN   WBC, Wet Prep HPF POC <10 <10   Sperm NONE SEEN   Urinalysis, Routine w reflex microscopic -Urine, Clean Catch     Status: Abnormal   Collection Time: 08/11/24  5:05 PM  Result Value Ref Range   Color, Urine YELLOW YELLOW   APPearance HAZY (A) CLEAR   Specific Gravity, Urine 1.026 1.005 - 1.030   pH 6.0 5.0 - 8.0   Glucose, UA NEGATIVE NEGATIVE mg/dL   Hgb urine dipstick SMALL (A) NEGATIVE   Bilirubin Urine NEGATIVE NEGATIVE   Ketones, ur  NEGATIVE NEGATIVE mg/dL   Protein, ur NEGATIVE NEGATIVE mg/dL   Nitrite NEGATIVE NEGATIVE   Leukocytes,Ua NEGATIVE NEGATIVE   RBC / HPF 0-5 0 - 5 RBC/hpf   WBC, UA 0-5 0 - 5 WBC/hpf   Bacteria, UA RARE (A) NONE SEEN   Squamous Epithelial / HPF 0-5 0 - 5 /HPF   Mucus PRESENT     Imaging:  No results found.  MDM & MAU COURSE  MDM: Moderate  MAU Course: -Vital signs within normal limits. -UA and wet prep for infection. -IUP previously confirmed on 11/21. It is too early for repeat US . -Wet prep positive for BV, will treat with metronidazole . -Urine dipstick shows positive for RBC's.  Negative for infection. -Patient would benefit from education regarding subchorionic hematoma and expected course.  Orders Placed This Encounter  Procedures   Wet prep, genital   Urinalysis, Routine w reflex microscopic -Urine, Clean Catch   Discharge patient   No orders of the defined types were placed in this encounter.   ASSESSMENT   1. Bacterial vaginosis in pregnancy   2. Subchorionic hematoma in first trimester, single or unspecified fetus   3. [redacted] weeks gestation of pregnancy     PLAN  Attempted to pull patient back x3 with no answer. Patient left AMA prior to results or examination by provider. Message sent to office to schedule viability scan ASAP.     Allergies as of 08/11/2024       Reactions   Tomato Flavor [flavoring Agent (non-screening)] Other (See Comments)   Bumps on tongue   Citrus Other (See Comments)   Bumps on tongue         Medication List     TAKE these medications    COLLAGEN PO Take 2 tablets by mouth daily.   Diclegis  10-10 MG Tbec Generic drug: Doxylamine -Pyridoxine Take 1 tablet by mouth 2 (two) times daily as needed (nausea).   ondansetron  4 MG tablet Commonly known as: ZOFRAN  Take 1 tablet (4 mg total) by mouth every 8 (eight) hours as needed for nausea or vomiting.        Joesph DELENA Sear, PA

## 2024-08-11 NOTE — MAU Note (Signed)
 Sydney Woods is a 23 y.o. at [redacted]w[redacted]d here in MAU reporting: has had some vag bleeding for a little over a week. Went last week to  Ed last week and they told her she was pregnant. U/S gave EDD of 04/03/25 by u/s. Still continues to have some light vag bleeding no pan or cramping.   LMP: unsure Onset of complaint: 1 week Pain score: 0  Vitals:   08/11/24 1700  BP: 114/66  Pulse: 72  Resp: (!) 183  Temp: 98.8 F (37.1 C)     FHT: n/a   Lab orders placed from triage: wet prep  gc, urine

## 2024-08-11 NOTE — Discharge Instructions (Addendum)
 The office will contact you Monday to get an ultrasound scheduled for sometime in the next week or two!  SUBCHORIONIC HEMATOMA: Your ultrasound shows a subchorionic hematoma, which is like a bruise between the wall of the uterus and the amniotic sac the baby is in. This can cause light vaginal bleeding. Most resolve on their own without causing any issues, but it is important that we monitor your symptoms.  We recommend the following: Watchful waiting. This is where your health care provider watches you closely for any changes in bleeding. Avoid anything in the vagina for the next 10-14 days, including vaginal intercourse. Avoid lifting anything heavier than 20 pounds. If you bleed, track and write down the number of pads you use each day and how soaked or full they are. Do not have sex until your provider says you can. Do not douche. Do not use tampons. Keep all follow-up visits. Your provider may ask you to have follow-up blood tests or ultrasound tests or both.

## 2024-08-13 ENCOUNTER — Inpatient Hospital Stay (HOSPITAL_COMMUNITY)
Admission: AD | Admit: 2024-08-13 | Discharge: 2024-08-13 | Disposition: A | Discharge status: Home / Self Care | Attending: Obstetrics and Gynecology | Admitting: Obstetrics and Gynecology

## 2024-08-13 ENCOUNTER — Inpatient Hospital Stay (HOSPITAL_COMMUNITY)

## 2024-08-13 DIAGNOSIS — Z3490 Encounter for supervision of normal pregnancy, unspecified, unspecified trimester: Secondary | ICD-10-CM

## 2024-08-13 DIAGNOSIS — B9689 Other specified bacterial agents as the cause of diseases classified elsewhere: Secondary | ICD-10-CM | POA: Diagnosis not present

## 2024-08-13 DIAGNOSIS — Z3A01 Less than 8 weeks gestation of pregnancy: Secondary | ICD-10-CM | POA: Insufficient documentation

## 2024-08-13 DIAGNOSIS — O208 Other hemorrhage in early pregnancy: Secondary | ICD-10-CM | POA: Diagnosis present

## 2024-08-13 DIAGNOSIS — O23591 Infection of other part of genital tract in pregnancy, first trimester: Secondary | ICD-10-CM | POA: Diagnosis not present

## 2024-08-13 LAB — CBC
HCT: 38.5 % (ref 36.0–46.0)
Hemoglobin: 12.8 g/dL (ref 12.0–15.0)
MCH: 29.5 pg (ref 26.0–34.0)
MCHC: 33.2 g/dL (ref 30.0–36.0)
MCV: 88.7 fL (ref 80.0–100.0)
Platelets: 359 K/uL (ref 150–400)
RBC: 4.34 MIL/uL (ref 3.87–5.11)
RDW: 12.8 % (ref 11.5–15.5)
WBC: 6.7 K/uL (ref 4.0–10.5)
nRBC: 0 % (ref 0.0–0.2)

## 2024-08-13 LAB — HCG, QUANTITATIVE, PREGNANCY: hCG, Beta Chain, Quant, S: 27135 m[IU]/mL — ABNORMAL HIGH (ref <5)

## 2024-08-13 MED ORDER — METRONIDAZOLE 500 MG PO TABS: 500.0000 mg | ORAL_TABLET | Freq: Two times a day (BID) | ORAL | 0 refills | Status: DC

## 2024-08-13 NOTE — Discharge Instructions (Signed)
 Prenatal Care Providers           Center for [facility] @ [facility]  [address] [phone]  Center for [facility] @ [facility]   [address]  [phone]  [facility] For [facility] @ [facility]       [address] [phone]            Center for [facility] @ [place]     [phone]          Center for [facility] @ [facility]   [address] [phone]  Center for [facility] @ [facility]  [address] [phone]     Center for [facility] @ [address] [place])  [address]   [phone]     [facility]  Phone: [phone]  [facility]  Phone: [phone]  [facility] Phone: [phone]  [facility] Phone: [phone]   [facility] Phone: [phone]  [facility]  Phone: [phone]

## 2024-08-13 NOTE — MAU Provider Note (Signed)
 Chief Complaint:  Vaginal Bleeding   HPI   Sydney Woods is a 23 y.o. H4E8968 at [redacted]w[redacted]d who presents to maternity admissions reporting light vaginal bleeding for about 10 days. Has had an ultrasound 11/21 showing a subchorionic hemorrhage. Was also diagnosed with BV but didn't know about the diagnosis so had not been taking medications. Doesn't want to continue with the pregnancy and would like to know if she is having a miscarriage. Denies abdominal pain, fever, chills, shortness of breath, chest pain. Notes increased stress in her life at this time due to issues with her partner.  Pregnancy Course: Recently in MAU on 11/28 but left AMA due to issues with partner.  Past Medical History:  Diagnosis Date   Anxiety    Chlamydia    Depression    Dyspnea    SOB WHEN GETS NERVOUS FOR 4 DAYS LAST TIME 08-08-2019   History of chlamydia 04/13/2017   03/2017 - treated in ED     PID (pelvic inflammatory disease)    Recurrent pregnancy loss    Syncope    NONE RECENT   OB History  Gravida Para Term Preterm AB Living  5 1 1  0 3 1  SAB IAB Ectopic Multiple Live Births  3 0 0 0 1    # Outcome Date GA Lbr Len/2nd Weight Sex Type Anes PTL Lv  5 Current           4 Term 09/26/23 [redacted]w[redacted]d  3750 g M Vag-Spont EPI  LIV  3 SAB 09/24/20 [redacted]w[redacted]d         2 SAB 02/04/20 [redacted]w[redacted]d         1 SAB 08/05/19 [redacted]w[redacted]d            Birth Comments: had D&C   Past Surgical History:  Procedure Laterality Date   DILATION AND EVACUATION N/A 08/09/2019   Procedure: DILATATION AND EVACUATION;  Surgeon: Cathlyn JAYSON Nikki Bobie FORBES, MD;  Location: [facility];  Service: Gynecology;  Laterality: N/A;   Family History  Problem Relation Age of Onset   Cancer Mother        pancreatic   Pancreatitis Mother    Heart disease Mother    Pancreatic cancer Mother    Cancer Father        leukemia   Diabetes Father    Leukemia Father    Healthy Brother    Alzheimer's disease Maternal Grandmother    Social History    Tobacco Use   Smoking status: Some Days    Types: Cigarettes    Passive exposure: Never   Smokeless tobacco: Never  Vaping Use   Vaping status: Never Used  Substance Use Topics   Alcohol use: Yes    Comment: socially   Drug use: Not Currently   Allergies  Allergen Reactions   Tomato Flavor [Flavoring Agent (Non-Screening)] Other (See Comments)    Bumps on tongue   Citrus Other (See Comments)    Bumps on tongue    Medications Prior to Admission  Medication Sig Dispense Refill Last Dose/Taking   Collagen-Vitamin C-Biotin (COLLAGEN PO) Take 2 tablets by mouth daily.      Doxylamine -Pyridoxine (DICLEGIS ) 10-10 MG TBEC Take 1 tablet by mouth 2 (two) times daily as needed (nausea). 60 tablet 0    ondansetron  (ZOFRAN ) 4 MG tablet Take 1 tablet (4 mg total) by mouth every 8 (eight) hours as needed for nausea or vomiting.       I have reviewed patient's Past  Medical Hx, Surgical Hx, Family Hx, Social Hx, medications and allergies.   ROS  Pertinent items noted in HPI and remainder of comprehensive ROS otherwise negative.   PHYSICAL EXAM  Patient Vitals for the past 24 hrs:  BP Temp Temp src Pulse Resp SpO2 Height Weight  08/13/24 1135 116/71 98.5 F (36.9 C) Oral 82 17 99 % 5' 8 (1.727 m) 68.9 kg    Constitutional: Well-developed, well-nourished female in no acute distress.  Cardiovascular: Warm and well-perfused Respiratory: normal effort, no problems with respiration noted GI: Abd soft, non-tender, non-distended MS: Extremities nontender, no edema, normal ROM Neurologic: Alert and oriented x 4.  GU: no CVA tenderness Pelvic: deferred       Labs: Results for orders placed or performed during the hospital encounter of 08/13/24 (from the past 24 hours)  hCG, quantitative, pregnancy     Status: Abnormal   Collection Time: 08/13/24 11:56 AM  Result Value Ref Range   hCG, Beta Chain, Quant, S 27,135 (H) <5 mIU/mL  CBC     Status: None   Collection Time: 08/13/24 11:56  AM  Result Value Ref Range   WBC 6.7 4.0 - 10.5 K/uL   RBC 4.34 3.87 - 5.11 MIL/uL   Hemoglobin 12.8 12.0 - 15.0 g/dL   HCT 61.4 63.9 - 53.9 %   MCV 88.7 80.0 - 100.0 fL   MCH 29.5 26.0 - 34.0 pg   MCHC 33.2 30.0 - 36.0 g/dL   RDW 87.1 88.4 - 84.4 %   Platelets 359 150 - 400 K/uL   nRBC 0.0 0.0 - 0.2 %    Imaging:  US  OB Transvaginal Result Date: 08/13/2024 CLINICAL DATA:  Vaginal bleeding. First trimester pregnancy with inconclusive fetal viability. EXAM: TRANSVAGINAL OB ULTRASOUND TECHNIQUE: Transvaginal ultrasound was performed for complete evaluation of the gestation as well as the maternal uterus, adnexal regions, and pelvic cul-de-sac. COMPARISON:  08/04/2024 FINDINGS: Intrauterine gestational sac: Single Yolk sac:  Visualized. Embryo:  Visualized. Cardiac Activity: Visualized. Heart Rate: 124 bpm CRL:   7 mm   6 w 4 d                  US  EDC: 04/04/2025 Subchorionic hemorrhage: Similar size but evolving hypoechoic subchorionic hemorrhage measuring 3.1 x 0.8 x 2.8 cm. Maternal uterus/adnexae: Normal appearance of both ovaries. 1.4 cm benign appearing left paraovarian cyst. No suspicious adnexal masses or abnormal free fluid identified. IMPRESSION: Single living IUP with estimated gestational age of [redacted] weeks 4 days. Moderate subchorionic hemorrhage, without significant change in size since prior study. Electronically Signed   By: Norleen DELENA Kil M.D.   On: 08/13/2024 13:55    MDM & MAU COURSE  MDM: Moderate  MAU Course: Orders Placed This Encounter  Procedures   US  OB Transvaginal   hCG, quantitative, pregnancy   CBC   No orders of the defined types were placed in this encounter.  VSS. Exam unremarkable. Will plan for bloodwork. Does have infant with her, will wait until another adult is present in the MAU prior to ordering ultrasound.  12:55pm - Was able to have someone pick up her child. Ultrasound ordered. CBC within normal limits, hCG pending. 2:15p - Ultrasound showing IUP  measuring [redacted]w[redacted]d GA with moderate subchorionic hemorrhage with no change since US  completed 11/21. Discussed these results, as well as previously diagnosed bacterial vaginosis with her.Is currently scheduled for new OB with this provider in January 2026. Also discussed termination options per patient request. Continues to be undecided,  would like time to consider options. All questions answered prior to discharge.  ASSESSMENT   1. [redacted] weeks gestation of pregnancy   2. Intrauterine pregnancy   3. Bacterial vaginosis   4. Subchorionic hemorrhage of placenta in first trimester     PLAN  Discharge home in stable condition with return precautions.      Allergies as of 08/13/2024       Reactions   Tomato Flavor [flavoring Agent (non-screening)] Other (See Comments)   Bumps on tongue   Citrus Other (See Comments)   Bumps on tongue         Medication List     TAKE these medications    COLLAGEN PO Take 2 tablets by mouth daily.   Diclegis  10-10 MG Tbec Generic drug: Doxylamine -Pyridoxine Take 1 tablet by mouth 2 (two) times daily as needed (nausea).   metroNIDAZOLE  500 MG tablet Commonly known as: FLAGYL  Take 1 tablet (500 mg total) by mouth 2 (two) times daily.   ondansetron  4 MG tablet Commonly known as: ZOFRAN  Take 1 tablet (4 mg total) by mouth every 8 (eight) hours as needed for nausea or vomiting.        Charlie Courts, MD  Family Medicine - Obstetrics Fellow

## 2024-08-13 NOTE — MAU Note (Signed)
 Sydney Woods is a 23 y.o. at [redacted]w[redacted]d here in MAU reporting: vaginal bleeding that has continued. Patient is not wanting to continue this pregnancy. Patient was not aware of +BV results or [facility] noted on US   Was seen in MAU on 11/28 and left AMA before receiving results.  MAU Course: 08/11/24 -Vital signs within normal limits. -UA and wet prep for infection. -IUP previously confirmed on 11/21. It is too early for repeat US . -Wet prep positive for BV, will treat with metronidazole . -Urine dipstick shows positive for RBC's.  Negative for infection. -Patient would benefit from education regarding subchorionic hematoma and expected course.  Onset of complaint: ongoing Pain score: 0 Vitals:   08/13/24 1135  BP: 116/71  Pulse: 82  Resp: 17  Temp: 98.5 F (36.9 C)  SpO2: 99%     FHT:n/a Lab orders placed from triage:

## 2024-08-14 LAB — GC/CHLAMYDIA PROBE AMP (~~LOC~~) NOT AT ARMC
Chlamydia: NEGATIVE
Comment: NEGATIVE
Comment: NORMAL
Neisseria Gonorrhea: NEGATIVE

## 2024-08-17 ENCOUNTER — Other Ambulatory Visit

## 2024-08-23 ENCOUNTER — Other Ambulatory Visit: Payer: Self-pay

## 2024-08-23 ENCOUNTER — Inpatient Hospital Stay (HOSPITAL_COMMUNITY)

## 2024-08-23 ENCOUNTER — Inpatient Hospital Stay (HOSPITAL_COMMUNITY)
Admission: AD | Admit: 2024-08-23 | Discharge: 2024-08-23 | Discharge status: Against Medical Advice | Payer: Self-pay | Attending: Obstetrics & Gynecology | Admitting: Obstetrics & Gynecology

## 2024-08-23 DIAGNOSIS — O209 Hemorrhage in early pregnancy, unspecified: Secondary | ICD-10-CM

## 2024-08-23 DIAGNOSIS — N939 Abnormal uterine and vaginal bleeding, unspecified: Secondary | ICD-10-CM

## 2024-08-23 DIAGNOSIS — Z3A08 8 weeks gestation of pregnancy: Secondary | ICD-10-CM

## 2024-08-23 DIAGNOSIS — Z3490 Encounter for supervision of normal pregnancy, unspecified, unspecified trimester: Secondary | ICD-10-CM

## 2024-08-23 DIAGNOSIS — O418X10 Other specified disorders of amniotic fluid and membranes, first trimester, not applicable or unspecified: Secondary | ICD-10-CM

## 2024-08-23 LAB — URINALYSIS, ROUTINE W REFLEX MICROSCOPIC
Bilirubin Urine: NEGATIVE
Glucose, UA: NEGATIVE mg/dL
Hgb urine dipstick: NEGATIVE
Ketones, ur: NEGATIVE mg/dL
Nitrite: NEGATIVE
Protein, ur: NEGATIVE mg/dL
Specific Gravity, Urine: 1.025 (ref 1.005–1.030)
pH: 5 (ref 5.0–8.0)

## 2024-08-23 LAB — WET PREP, GENITAL
Clue Cells Wet Prep HPF POC: NONE SEEN
Sperm: NONE SEEN
Trich, Wet Prep: NONE SEEN
WBC, Wet Prep HPF POC: <10 (ref <10)
Yeast Wet Prep HPF POC: NONE SEEN

## 2024-08-23 NOTE — Progress Notes (Signed)
 Left AMA, informed NT she's changed her mind regarding having ultrasound because she feels dismissed.

## 2024-08-23 NOTE — MAU Provider Note (Signed)
 None     S [name] is a 23 y.o. [id number] pregnant female at [redacted]w[redacted]d who presents to MAU today with complaint of vaginal bleeding, and nausea.  She reports that the bleeding has continued since her last MAU visit, she reports changing her pad about every 1-1.5 hours. Minimal to no clots noted.  She reports intermittent cramping, none currently.  Denies recent intercourse.  States she wants betaHcG.  Reports she called her OB, was not able to be seen and they encouraged her to come here.    Reports she has not been taking prescribed nausea medications at home.   Viable IUP previously identified on US  with [facility] noted.   Receives care at [facility], has not initiated care this pregnancy.   Pertinent items noted in HPI and remainder of comprehensive ROS otherwise negative.   O BP 120/61 (BP Location: Right Arm)   Pulse 76   Temp 98.1 F (36.7 C) (Oral)   Resp 20   Ht 5' 8 (1.727 m)   Wt 69.2 kg   LMP  (LMP Unknown)   SpO2 100%   BMI 23.20 kg/m  Physical Exam Vitals reviewed.  Constitutional:      Appearance: Normal appearance.  HENT:     Head: Normocephalic.  Cardiovascular:     Rate and Rhythm: Normal rate.     Pulses: Normal pulses.  Pulmonary:     Effort: Pulmonary effort is normal.  Skin:    General: Skin is warm and dry.     Capillary Refill: Capillary refill takes less than 2 seconds.  Neurological:     General: No focal deficit present.     Mental Status: She is alert and oriented to person, place, and time.  Psychiatric:     Comments: Expressing frustration      MDM:  Low  MAU Course:  Ddx: SCH vs. SAB.  - Education regarding lack of utility for HCG collection at this point in pregnancy provided to patient. HCG once viable IUP identified not useful.  - US  ordered to check progression of hematoma with continued vaginal bleeding, and for patient reassurance. - Wet prep negative for vaginal infection, BV previously noted no longer present. - UA reveals  patient well hydrated despite complaints of nausea.   A Intrauterine pregnancy  [redacted] weeks gestation of pregnancy  Vaginal bleeding  Subchorionic hematoma in first trimester, single or unspecified fetus  Medical screening exam complete  P Patient left AMA before completion of US .   Camie Rote, MSN, CNM 08/23/2024 9:59 AM  Certified Nurse Midwife, [facility]

## 2024-08-23 NOTE — MAU Note (Signed)
 Sydney Woods is a 23 y.o. at [redacted]w[redacted]d here in MAU reporting: she's continuing to have light VB for the past 2 weeks.  Denies current cramping, had cramping yesterday.  Denies recent intercourse.  States wants HCG level.  LMP: NA Onset of complaint: 2 weeks Pain score: 0 Vitals:   08/23/24 0852  BP: 120/61  Pulse: 76  Resp: 20  Temp: 98.1 F (36.7 C)  SpO2: 100%     FHT: NA  Lab orders placed from triage: UA

## 2024-08-24 LAB — GC/CHLAMYDIA PROBE AMP (~~LOC~~) NOT AT ARMC
Chlamydia: NEGATIVE
Comment: NEGATIVE
Comment: NORMAL
Neisseria Gonorrhea: NEGATIVE

## 2024-08-26 ENCOUNTER — Inpatient Hospital Stay (HOSPITAL_COMMUNITY)
Admission: AD | Admit: 2024-08-26 | Discharge: 2024-08-26 | Disposition: A | Discharge status: Home / Self Care | Attending: Obstetrics & Gynecology | Admitting: Obstetrics & Gynecology

## 2024-08-26 ENCOUNTER — Encounter (HOSPITAL_COMMUNITY): Payer: Self-pay | Admitting: Obstetrics & Gynecology

## 2024-08-26 DIAGNOSIS — O26891 Other specified pregnancy related conditions, first trimester: Secondary | ICD-10-CM

## 2024-08-26 DIAGNOSIS — Z3A08 8 weeks gestation of pregnancy: Secondary | ICD-10-CM

## 2024-08-26 DIAGNOSIS — O219 Vomiting of pregnancy, unspecified: Secondary | ICD-10-CM

## 2024-08-26 DIAGNOSIS — K529 Noninfective gastroenteritis and colitis, unspecified: Secondary | ICD-10-CM

## 2024-08-26 LAB — URINALYSIS, ROUTINE W REFLEX MICROSCOPIC
Bilirubin Urine: NEGATIVE
Glucose, UA: NEGATIVE mg/dL
Hgb urine dipstick: NEGATIVE
Ketones, ur: NEGATIVE mg/dL
Nitrite: NEGATIVE
Protein, ur: 30 mg/dL — AB
Specific Gravity, Urine: 1.031 — ABNORMAL HIGH (ref 1.005–1.030)
pH: 5 (ref 5.0–8.0)

## 2024-08-26 MED ORDER — LACTATED RINGERS IV BOLUS
1000.0000 mL | Freq: Once | INTRAVENOUS | Status: AC
  Administered 2024-08-26: 1000 mL via INTRAVENOUS

## 2024-08-26 MED ORDER — FAMOTIDINE 20 MG PO TABS: 20.0000 mg | ORAL_TABLET | Freq: Two times a day (BID) | ORAL | 0 refills | Status: AC

## 2024-08-26 MED ORDER — SODIUM CHLORIDE 0.9 % IV SOLN
25.0000 mg | Freq: Once | INTRAVENOUS | Status: AC
  Administered 2024-08-26: 25 mg via INTRAVENOUS
  Filled 2024-08-26: qty 1

## 2024-08-26 MED ORDER — FAMOTIDINE IN NACL 20-0.9 MG/50ML-% IV SOLN
20.0000 mg | Freq: Once | INTRAVENOUS | Status: AC
  Administered 2024-08-26: 20 mg via INTRAVENOUS
  Filled 2024-08-26: qty 50

## 2024-08-26 MED ORDER — LOPERAMIDE HCL 2 MG PO CAPS
4.0000 mg | ORAL_CAPSULE | Freq: Once | ORAL | Status: AC
  Administered 2024-08-26: 4 mg via ORAL
  Filled 2024-08-26: qty 2

## 2024-08-26 MED ORDER — METOCLOPRAMIDE HCL 10 MG PO TABS: 10.0000 mg | ORAL_TABLET | Freq: Three times a day (TID) | ORAL | 2 refills | Status: AC | PRN

## 2024-08-26 NOTE — MAU Note (Signed)
..  Sydney Woods is a 23 y.o. at [redacted]w[redacted]d here in MAU reporting: nausea and vomiting for the last two weeks, but reports since 0300 this morning she has had 9-10 episodes of diarrhea. States she feels very weak and lightheaded. Is also having a small amount of spotting that started x3 weeks ago on and off.   Pain score: 4 Vitals:   08/26/24 1549  BP: 115/62  Pulse: 86  Resp: 17  Temp: 98.6 F (37 C)  SpO2: 100%      Lab orders placed from triage:   UA

## 2024-08-26 NOTE — Discharge Instructions (Signed)

## 2024-08-26 NOTE — MAU Provider Note (Signed)
 History     CSN: [id number]  Arrival date and time: 08/26/24 1534   Event Date/Time   First Provider Initiated Contact with Patient 08/26/24 1646      Chief Complaint  Patient presents with   Nausea   Emesis   Vaginal Bleeding   HPI Sydney Woods is a 23 y.o. [id number] at [redacted]w[redacted]d who presents with n/v/d. Has had n/v throughout the pregnancy. Normally vomits a few times per day. Has been taking zofran  & another unknown medication her mother gave her, without relief. Since last night has had 10+ episodes of vomiting & diarrhea. Unsure of sick contacts. Denies abdominal pain or fever. Has had vaginal spotting in setting of known subchorionic hemorrhage.   OB History     Gravida  5   Para  1   Term  1   Preterm  0   AB  3   Living  1      SAB  3   IAB  0   Ectopic  0   Multiple  0   Live Births  1           Past Medical History:  Diagnosis Date   Anxiety    Chlamydia    Depression    Dyspnea    SOB WHEN GETS NERVOUS FOR 4 DAYS LAST TIME 08-08-2019   History of chlamydia 04/13/2017   03/2017 - treated in ED     PID (pelvic inflammatory disease)    Recurrent pregnancy loss    Syncope    NONE RECENT    Past Surgical History:  Procedure Laterality Date   DILATION AND EVACUATION N/A 08/09/2019   Procedure: DILATATION AND EVACUATION;  Surgeon: Cathlyn JAYSON Nikki Bobie FORBES, MD;  Location: [facility];  Service: Gynecology;  Laterality: N/A;    Family History  Problem Relation Age of Onset   Cancer Mother        pancreatic   Pancreatitis Mother    Heart disease Mother    Pancreatic cancer Mother    Cancer Father        leukemia   Diabetes Father    Leukemia Father    Healthy Brother    Alzheimer's disease Maternal Grandmother     Social History[1]  Allergies: Allergies[2]  Medications Prior to Admission  Medication Sig Dispense Refill Last Dose/Taking   Collagen-Vitamin C-Biotin (COLLAGEN PO) Take 2 tablets by mouth daily.       Doxylamine -Pyridoxine (DICLEGIS ) 10-10 MG TBEC Take 1 tablet by mouth 2 (two) times daily as needed (nausea). 60 tablet 0    metroNIDAZOLE  (FLAGYL ) 500 MG tablet Take 1 tablet (500 mg total) by mouth 2 (two) times daily. 14 tablet 0    ondansetron  (ZOFRAN ) 4 MG tablet Take 1 tablet (4 mg total) by mouth every 8 (eight) hours as needed for nausea or vomiting.      Prenatal Vit-Fe Fumarate-FA (MULTIVITAMIN-PRENATAL) 27-0.8 MG TABS tablet Take 1 tablet by mouth daily at 12 noon.       Review of Systems  All other systems reviewed and are negative.  Physical Exam   Blood pressure 134/61, pulse 76, temperature 98.6 F (37 C), temperature source Oral, resp. rate 17, weight 66.9 kg, SpO2 100%, currently breastfeeding.  Physical Exam Vitals and nursing note reviewed.  Constitutional:      General: She is not in acute distress.    Appearance: She is well-developed. She is not ill-appearing.  HENT:     Head: Normocephalic and  atraumatic.  Eyes:     General: No scleral icterus.       Right eye: No discharge.        Left eye: No discharge.     Conjunctiva/sclera: Conjunctivae normal.  Pulmonary:     Effort: Pulmonary effort is normal. No respiratory distress.  Neurological:     General: No focal deficit present.     Mental Status: She is alert.  Psychiatric:        Mood and Affect: Mood normal.        Behavior: Behavior normal.     MAU Course  Procedures Results for orders placed or performed during the hospital encounter of 08/26/24 (from the past 24 hours)  Urinalysis, Routine w reflex microscopic -Urine, Random     Status: Abnormal   Collection Time: 08/26/24  4:21 PM  Result Value Ref Range   Color, Urine AMBER (A) YELLOW   APPearance CLOUDY (A) CLEAR   Specific Gravity, Urine 1.031 (H) 1.005 - 1.030   pH 5.0 5.0 - 8.0   Glucose, UA NEGATIVE NEGATIVE mg/dL   Hgb urine dipstick NEGATIVE NEGATIVE   Bilirubin Urine NEGATIVE NEGATIVE   Ketones, ur NEGATIVE NEGATIVE mg/dL    Protein, ur 30 (A) NEGATIVE mg/dL   Nitrite NEGATIVE NEGATIVE   Leukocytes,Ua TRACE (A) NEGATIVE   RBC / HPF 0-5 0 - 5 RBC/hpf   WBC, UA 6-10 0 - 5 WBC/hpf   Bacteria, UA RARE (A) NONE SEEN   Squamous Epithelial / HPF 6-10 0 - 5 /HPF   Mucus PRESENT     MDM   Assessment and Plan   1. Nausea and vomiting during pregnancy   2. Gastroenteritis presumed infectious   3. [redacted] weeks gestation of pregnancy    -Suspected gastroenteritis. Reports improvement after IV fluids, imodium , pepcid , & reglan .  -BSUS performed for heart rate due to gestational age. +FCA, 182 bpm -Rx reglan  & pepcid . Discussed bland diet & fluids. Reviewed reasons to return to MAU  Rocky Satterfield 08/26/2024, 4:46 PM      [1]  Social History Tobacco Use   Smoking status: Former    Types: Cigars    Passive exposure: Never   Smokeless tobacco: Never  Vaping Use   Vaping status: Never Used  Substance Use Topics   Alcohol use: Not Currently    Comment: socially   Drug use: Not Currently    Types: Marijuana    Comment: 1 month ago  [2]  Allergies Allergen Reactions   Tomato Flavor [Flavoring Agent (Non-Screening)] Other (See Comments)    Bumps on tongue   Citrus Other (See Comments)    Bumps on tongue

## 2024-09-12 ENCOUNTER — Telehealth

## 2024-09-12 ENCOUNTER — Other Ambulatory Visit: Payer: Self-pay

## 2024-09-12 ENCOUNTER — Inpatient Hospital Stay (HOSPITAL_COMMUNITY)
Admission: AD | Admit: 2024-09-12 | Discharge: 2024-09-12 | Disposition: A | Discharge status: Home / Self Care | Attending: Obstetrics and Gynecology | Admitting: Obstetrics and Gynecology

## 2024-09-12 DIAGNOSIS — Z3490 Encounter for supervision of normal pregnancy, unspecified, unspecified trimester: Secondary | ICD-10-CM | POA: Insufficient documentation

## 2024-09-12 DIAGNOSIS — O0990 Supervision of high risk pregnancy, unspecified, unspecified trimester: Secondary | ICD-10-CM

## 2024-09-12 DIAGNOSIS — O210 Mild hyperemesis gravidarum: Secondary | ICD-10-CM | POA: Diagnosis present

## 2024-09-12 DIAGNOSIS — O0991 Supervision of high risk pregnancy, unspecified, first trimester: Secondary | ICD-10-CM | POA: Diagnosis not present

## 2024-09-12 DIAGNOSIS — O219 Vomiting of pregnancy, unspecified: Secondary | ICD-10-CM

## 2024-09-12 DIAGNOSIS — Z3A10 10 weeks gestation of pregnancy: Secondary | ICD-10-CM

## 2024-09-12 DIAGNOSIS — Z3491 Encounter for supervision of normal pregnancy, unspecified, first trimester: Secondary | ICD-10-CM

## 2024-09-12 LAB — URINALYSIS, ROUTINE W REFLEX MICROSCOPIC
Bilirubin Urine: NEGATIVE
Glucose, UA: NEGATIVE mg/dL
Hgb urine dipstick: NEGATIVE
Ketones, ur: NEGATIVE mg/dL
Leukocytes,Ua: NEGATIVE
Nitrite: NEGATIVE
Protein, ur: 100 mg/dL — AB
Specific Gravity, Urine: 1.027 (ref 1.005–1.030)
pH: 7 (ref 5.0–8.0)

## 2024-09-12 MED ORDER — ONDANSETRON 4 MG PO TBDP
8.0000 mg | ORAL_TABLET | Freq: Once | ORAL | Status: AC
  Administered 2024-09-12: 8 mg via ORAL
  Filled 2024-09-12: qty 2

## 2024-09-12 NOTE — Progress Notes (Unsigned)
 New OB Intake  I connected with Jordy Favila  on 09/12/2024 at 10:15 AM EST by {Contact:24193} Video Visit and verified that I am speaking with the correct person using two identifiers. Nurse is located at [facility] and pt is located at ***.  I discussed the limitations, risks, security and privacy concerns of performing an evaluation and management service by telephone and the availability of in person appointments. I also discussed with the patient that there may be a patient responsible charge related to this service. The patient expressed understanding and agreed to proceed.  I explained I am completing New OB Intake today. We discussed EDD of *** based on {EDD:33166}. Pt is G5P1031. I reviewed her allergies, medications and Medical/Surgical/OB history.    Patient Active Problem List   Diagnosis Date Noted   Carrier of spinal muscular atrophy 03/18/2023   Recurrent pregnancy loss 09/24/2020     Concerns addressed today  Delivery Plans Plans to deliver at [facility]. Discussed the nature of our practice with multiple providers including residents and students as well as female and female providers. Due to the size of the practice, the delivering provider may not be the same as those providing prenatal care.   Patient {Is/is not:9024} interested in water birth.  MyChart/Babyscripts MyChart access verified. I explained pt will have some visits in office and some virtually. Babyscripts instructions given and order placed. Patient verifies receipt of registration text/e-mail. Account successfully created and app downloaded. If patient is a candidate for Optimized scheduling, add to sticky note.   Blood Pressure Cuff/Weight Scale {blood pressure cuff:24241} Explained after first prenatal appt pt will check weekly and document in Babyscripts. Patient {weight scale:28336}.  Anatomy US  Explained first scheduled US  will be around 19 weeks. Anatomy US  scheduled for *** at ***.  Is patient a  CenteringPregnancy candidate?  {Accepted:19197::Accepted,Not a Candidate,Declined} Declined due to {Declined:19197::Schedule,Childcare,Group setting,Support person concern,Declined to say,Enrolled in [facility],***} Not a candidate due to {Not a Candidate:19197::DM,CHTN, medication controlled,Language barrier,>28 weeks,Multiple gestation (mono-mono or mono-di),Complex coordination of care needed,***} If accepted,    Is patient a Mom+Baby Combined Care candidate?  Not a candidate   If accepted, confirm patient does not intend to move from the area for at least 12 months, then notify Mom+Baby staff  Is patient a candidate for Babyscripts Optimization? {babyscripts:31704}   First visit review I reviewed new OB appt with patient. Explained pt will be seen by *** at first visit. Discussed [place] genetic screening with patient. *** Panorama and Horizon.. Routine prenatal labs {collected today/needed at new OB visit:9024}   Last Pap Diagnosis  Date Value Ref Range Status  03/01/2023   Final   - Negative for intraepithelial lesion or malignancy (NILM)    B'Aisha T Shaunak Kreis, CMA 09/12/2024  10:10 AM

## 2024-09-12 NOTE — Progress Notes (Signed)
 Dr. Alvester Morin into see pt and discuss POC.

## 2024-09-12 NOTE — Discharge Instructions (Addendum)
 You were seen for nausea and vomiting You were not severely dehydrated on exam Your pregnancy was normally progressing with a normal fetal heart rate on doppler.   I recommend small frequent meal to help your symptoms  Please use your prescribed medications-- reglan , pepcid , and zofran  PRN to help your symptoms.   Return to the MAU if you vomit more than 3 times in a 24 hour period.   You can try the following over the counter medications to help your symptoms.   Safe Medications in Pregnancy   Nausea/Vomiting:  Bonine  Dramamine  Emetrol  Ginger extract  Sea bands  Meclizine  Nausea medication to take during pregnancy:  Unisom  (doxylamine  succinate 25 mg tablets) Take one tablet daily at bedtime. If symptoms are not adequately controlled, the dose can be increased to a maximum recommended dose of two tablets daily (1/2 tablet in the morning, 1/2 tablet mid-afternoon and one at bedtime).  Vitamin B6 100mg  tablets. Take one tablet twice a day (up to 200 mg per day).

## 2024-09-12 NOTE — MAU Note (Signed)
 Sydney Woods is a 23 y.o. at [redacted]w[redacted]d here in MAU reporting: she hasn't been able to keep anything down for the past two days.  Reports she's vomited three times this morning.  Has Rx's for Pepcid , Reglan  and Zofran , not taking any of them.  States Regan & Pepcid  don't work and read Zofran  causes cleft lip. Denies VB, had some cramping earlier but none now.  States she currently has left sided lateral pain that's tense and constant.  LMP:  Onset of complaint: 2 days ago Pain score: 6 Vitals:   09/12/24 1331  BP: 94/70  Pulse: 75  Resp: 18  Temp: 99.2 F (37.3 C)  SpO2: 100%     FHT: 166 bpm  Lab orders placed from triage: UA

## 2024-09-12 NOTE — Progress Notes (Signed)
 New OB Intake  I connected with Sydney Woods  on 09/12/2024 at  2:15 PM EST by MyChart Video Visit and verified that I am speaking with the correct person using two identifiers. Nurse is located at [facility] and pt is located at MAU.  I discussed the limitations, risks, security and privacy concerns of performing an evaluation and management service by telephone and the availability of in person appointments. I also discussed with the patient that there may be a patient responsible charge related to this service. The patient expressed understanding and agreed to proceed.  I explained I am completing New OB Intake today. We discussed EDD of 04/04/2025 based on US  at 6.4 weeks. Pt is G5P1031. I reviewed her allergies, medications and Medical/Surgical/OB history.    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 Discussed the nature of our practice with multiple providers including residents and students as well as female and female providers. Due to the size of the practice, the delivering provider may not be the same as those providing prenatal care.   Patient is not interested in water birth.  MyChart/Babyscripts MyChart access verified. I explained pt will have some visits in office and some virtually. Babyscripts instructions given and order placed. Patient verifies receipt of registration text/e-mail. Account successfully created and app downloaded. If patient is a candidate for Optimized scheduling, add to sticky note.   Blood Pressure Cuff/Weight Scale Patient has blood pressure cuff at home. Explained after first prenatal appt pt will check weekly and document in Babyscripts.   Anatomy US  Explained first scheduled US  will be around 19 weeks. Patient requested to have her anatomy US  schedule at her new ob appointment due to  her being in school and she needs to work with her schedule.  Is patient a CenteringPregnancy candidate?  Declined Declined due to Declined to say (patient tried centering before and doesn't want to do it again)   Is patient a Mom+Baby Combined Care candidate?  Not a candidate   If accepted, confirm patient does not intend to move from the area for at least 12 months, then notify Mom+Baby staff  Is patient a candidate for Babyscripts Optimization?   First visit review I reviewed new OB appt with patient. Explained pt will be seen by Dr. Jomarie at first visit. Discussed [place] genetic screening with patient.  Panorama wanted at nob. Routine prenatal labs is needed at nob  Last Pap Diagnosis  Date Value Ref Range Status  03/01/2023   Final   - Negative for intraepithelial lesion or malignancy (NILM)    B'Aisha T Zenon Leaf, CMA 09/12/2024  11:33 AM

## 2024-09-12 NOTE — MAU Provider Note (Addendum)
 " History     CSN: [id number]  Arrival date and time: 09/12/24 1224   Event Date/Time   First Provider Initiated Contact with Patient 09/12/24 1347      Chief Complaint  Patient presents with   Morning Sickness   Sydney Woods is at G5P1031 at [redacted]w[redacted]d presenting with nausea and vomiting. Reports inability to keep down food. She is able to drink. She reports she has not taken her prescribed zofran  (due to concerns about cleft lip/palate), reglan  or pepcid . She felt like the medications were not helping much after they were prescribed on 12/13. She reports she only took a few doses. She is worried that she is dehydrated and is just over this.  No hx of HG in previous pregnancy. Has live IUP confirmed on US  11/30. She is worried HG will cause anomalies in baby.   Denies bleeding Reports mild cramping associated with dehydration per patient Denies dysuria Reports left sided pain constant Plans to got to [facility] for care Has new OB intake today    OB History     Gravida  5   Para  1   Term  1   Preterm  0   AB  3   Living  1      SAB  3   IAB  0   Ectopic  0   Multiple  0   Live Births  1           Past Medical History:  Diagnosis Date   Anxiety    Anxiety 02/24/2023   Chlamydia    Depression    Depression 02/24/2023   Dyspnea    SOB WHEN GETS NERVOUS FOR 4 DAYS LAST TIME 08-08-2019   Hearing loss 11/14/2021   History of chlamydia 04/13/2017   03/2017 - treated in ED     PID (pelvic inflammatory disease)    Recurrent pregnancy loss    Supervision of high risk pregnancy, antepartum 02/24/2023              NURSING     PROVIDER      Office Location    [facility]    Dating by    LMP       [facility] Model    Centering    Anatomy U/S    Complete      Initiated care at     [employer]     English                     LAB RESULTS       Support Person    FOB-Devon Thompson    Genetics    NIPS: LR female  AFP: DNKA                NT/IT (FT  only)                     Ca   Syncope    NONE RECENT    Past Surgical History:  Procedure Laterality Date   DILATION AND EVACUATION N/A 08/09/2019   Procedure: DILATATION AND EVACUATION;  Surgeon: Cathlyn JAYSON Nikki Bobie FORBES, MD;  Location: [facility];  Service: Gynecology;  Laterality: N/A;    Family History  Problem Relation Age of Onset   Cancer Mother  pancreatic   Pancreatitis Mother    Heart disease Mother    Pancreatic cancer Mother    Cancer Father        leukemia   Diabetes Father    Leukemia Father    Healthy Brother    Alzheimer's disease Maternal Grandmother     Social History[1]  Allergies: Allergies[2]  Medications Prior to Admission  Medication Sig Dispense Refill Last Dose/Taking   Prenatal Vit-Fe Fumarate-FA (MULTIVITAMIN-PRENATAL) 27-0.8 MG TABS tablet Take 1 tablet by mouth daily at 12 noon.   Past Month   Collagen-Vitamin C-Biotin (COLLAGEN PO) Take 2 tablets by mouth daily. (Patient not taking: Reported on 09/12/2024)      famotidine  (PEPCID ) 20 MG tablet Take 1 tablet (20 mg total) by mouth 2 (two) times daily. (Patient not taking: Reported on 09/12/2024) 60 tablet 0    metoCLOPramide  (REGLAN ) 10 MG tablet Take 1 tablet (10 mg total) by mouth every 8 (eight) hours as needed. (Patient not taking: Reported on 09/12/2024) 30 tablet 2    ondansetron  (ZOFRAN ) 4 MG tablet Take 1 tablet (4 mg total) by mouth every 8 (eight) hours as needed for nausea or vomiting. (Patient not taking: Reported on 09/12/2024)       Review of Systems  Constitutional:  Negative for chills and fever.  HENT:  Negative for congestion and sore throat.   Eyes:  Negative for pain and visual disturbance.  Respiratory:  Negative for cough, chest tightness and shortness of breath.   Cardiovascular:  Negative for chest pain.  Gastrointestinal:  Positive for nausea and vomiting. Negative for abdominal pain and diarrhea.  Endocrine: Negative for cold intolerance and  heat intolerance.  Genitourinary:  Negative for dysuria and flank pain.  Musculoskeletal:  Negative for back pain.  Skin:  Negative for rash.  Allergic/Immunologic: Negative for food allergies.  Neurological:  Negative for dizziness and light-headedness.  Psychiatric/Behavioral:  Negative for agitation.    Physical Exam   Blood pressure 94/63, pulse 67, temperature 99.2 F (37.3 C), temperature source Oral, resp. rate 18, SpO2 100%, currently breastfeeding.  Wt Readings from Last 3 Encounters:  08/26/24 66.9 kg  08/23/24 69.2 kg  08/13/24 68.9 kg   FHR in triage 167  Physical Exam Vitals and nursing note reviewed.  Constitutional:      General: She is not in acute distress.    Appearance: She is well-developed.  HENT:     Head: Normocephalic and atraumatic.  Eyes:     General: No scleral icterus.    Conjunctiva/sclera: Conjunctivae normal.  Cardiovascular:     Rate and Rhythm: Normal rate.  Pulmonary:     Effort: Pulmonary effort is normal.  Chest:     Chest wall: No tenderness.  Abdominal:     Palpations: Abdomen is soft.     Tenderness: There is no abdominal tenderness. There is no guarding or rebound.  Genitourinary:    Vagina: Normal.  Musculoskeletal:        General: Normal range of motion.     Cervical back: Normal range of motion and neck supple.     Comments: Moving well without pain, no TTP over the left side  Skin:    General: Skin is warm and dry.     Findings: No rash.  Neurological:     Mental Status: She is alert and oriented to person, place, and time.     MAU Course  Procedures  MDM: moderate  This patient presents to the ED for concern of  Chief Complaint  Patient presents with   Morning Sickness     These complains involves an extensive number of treatment options, and is a complaint that carries with it a high risk of complications and morbidity.  The differential diagnosis for  1. Vomiting in pregnancy INCLUDES infectious causes  (less likely due to lack of infectious sx like fever/chills and otherwise normal vital signs, most likely normal variant. No signs of profound dehydration on exam.   Co morbidities that complicate the patient evaluation:  Patient Active Problem List   Diagnosis Date Noted   Supervision of low-risk pregnancy 09/12/2024   Carrier of spinal muscular atrophy 03/18/2023   Recurrent pregnancy loss 09/24/2020   External records from outside source obtained and reviewed including Scanned media records, CareEverywhere, and Prenatal care records  I ordered, and personally interpreted labs.  The pertinent results include:   Results for orders placed or performed during the hospital encounter of 09/12/24 (from the past 24 hours)  Urinalysis, Routine w reflex microscopic -Urine, Clean Catch     Status: Abnormal   Collection Time: 09/12/24 12:58 PM  Result Value Ref Range   Color, Urine AMBER (A) YELLOW   APPearance CLOUDY (A) CLEAR   Specific Gravity, Urine 1.027 1.005 - 1.030   pH 7.0 5.0 - 8.0   Glucose, UA NEGATIVE NEGATIVE mg/dL   Hgb urine dipstick NEGATIVE NEGATIVE   Bilirubin Urine NEGATIVE NEGATIVE   Ketones, ur NEGATIVE NEGATIVE mg/dL   Protein, ur 899 (A) NEGATIVE mg/dL   Nitrite NEGATIVE NEGATIVE   Leukocytes,Ua NEGATIVE NEGATIVE   RBC / HPF 0-5 0 - 5 RBC/hpf   WBC, UA 0-5 0 - 5 WBC/hpf   Bacteria, UA FEW (A) NONE SEEN   Squamous Epithelial / HPF 0-5 0 - 5 /HPF   Mucus PRESENT    Amorphous Crystal PRESENT     Medicines ordered and prescription drug management:  Medications:   Meds ordered this encounter  Medications   ondansetron  (ZOFRAN -ODT) disintegrating tablet 8 mg    MAU Course:  1:55 PM Reviewed with client trial of ODT Zofran . She is willing accept this and trial PO intake. Reviewed r/b of zofran  and using already prescribed medications to help sx. Discussed typical peaking of sx at 10wks with resolution by 15-20 weeks for HG.   2:42 PM improved nausea with zofran   and was able to keep down fluids and crackers in MAU prior to discharge.   After the interventions noted above, I reevaluated the patient and found that they have :improved  Dispostion: discharged   Assessment and Plan   1. Nausea/vomiting in pregnancy   2. Viable pregnancy in first trimester   3. [redacted] weeks gestation of pregnancy    - Responded well to zofran   - Take reglan  and pepcid  at home - Small frequent meals recommended.  - Follow up for prenatal care  Sydney Woods 09/12/2024, 2:58 PM      [1]  Social History Tobacco Use   Smoking status: Never    Passive exposure: Never   Smokeless tobacco: Never  Vaping Use   Vaping status: Never Used  Substance Use Topics   Alcohol use: Not Currently    Comment: socially   Drug use: Not Currently    Comment: 1 month ago  [2]  Allergies Allergen Reactions   Tomato Flavor [Flavoring Agent (Non-Screening)] Other (See Comments)    Bumps on tongue   Citrus Other (See Comments)    Bumps on tongue    "

## 2024-09-12 NOTE — Patient Instructions (Signed)

## 2024-09-13 NOTE — Progress Notes (Signed)
No showed this appt.

## 2024-09-18 ENCOUNTER — Other Ambulatory Visit: Payer: Self-pay

## 2024-09-18 ENCOUNTER — Other Ambulatory Visit (HOSPITAL_COMMUNITY)
Admission: RE | Admit: 2024-09-18 | Discharge: 2024-09-18 | Disposition: A | Discharge status: Home / Self Care | Source: Ambulatory Visit

## 2024-09-18 ENCOUNTER — Ambulatory Visit (INDEPENDENT_AMBULATORY_CARE_PROVIDER_SITE_OTHER)

## 2024-09-18 VITALS — BP 107/68 | HR 88 | Wt 149.5 lb

## 2024-09-18 DIAGNOSIS — Z3491 Encounter for supervision of normal pregnancy, unspecified, first trimester: Secondary | ICD-10-CM | POA: Diagnosis present

## 2024-09-18 DIAGNOSIS — Z3A11 11 weeks gestation of pregnancy: Secondary | ICD-10-CM

## 2024-09-18 NOTE — Progress Notes (Signed)
 "    Subjective:   Sydney Woods is a 24 y.o. H4E8968 at [redacted]w[redacted]d by early ultrasound being seen today for her first obstetrical visit.  Her obstetrical history is significant for has Recurrent pregnancy loss; Carrier of spinal muscular atrophy; and Supervision of low-risk pregnancy on their problem list.. Patient does intend to breast feed. Pregnancy history fully reviewed.  Patient reports nausea and vomiting.  HISTORY: OB History  Gravida Para Term Preterm AB Living  5 1 1  0 3 1  SAB IAB Ectopic Multiple Live Births  3 0 0 0 1    # Outcome Date GA Lbr Len/2nd Weight Sex Type Anes PTL Lv  5 Current           4 Term 09/26/23 106w1d  8 lb 4.3 oz (3.75 kg) M Vag-Spont EPI  LIV     Name: Roscoe Starr Hummer     Apgar1: 7  Apgar5: 9  3 SAB 09/24/20 [redacted]w[redacted]d         2 SAB 02/04/20 [redacted]w[redacted]d         1 SAB 08/05/19 [redacted]w[redacted]d            Birth Comments: had D&C   Past Medical History:  Diagnosis Date   Anxiety    Anxiety 02/24/2023   Chlamydia    Depression    Depression 02/24/2023   Dyspnea    SOB WHEN GETS NERVOUS FOR 4 DAYS LAST TIME 08-08-2019   Hearing loss 11/14/2021   History of chlamydia 04/13/2017   03/2017 - treated in ED     PID (pelvic inflammatory disease)    Recurrent pregnancy loss    Supervision of high risk pregnancy, antepartum 02/24/2023              NURSING     PROVIDER      Office Location    [facility]    Dating by    LMP       [facility] Model    Centering    Anatomy U/S    Complete      Initiated care at     [employer]     English                     LAB RESULTS       Support Person    FOB-Devon Thompson    Genetics    NIPS: LR female  AFP: DNKA                NT/IT (FT only)                     Ca   Syncope    NONE RECENT   Past Surgical History:  Procedure Laterality Date   DILATION AND EVACUATION N/A 08/09/2019   Procedure: DILATATION AND EVACUATION;  Surgeon: Cathlyn JAYSON Nikki Bobie FORBES, MD;  Location: [facility];   Service: Gynecology;  Laterality: N/A;   Family History  Problem Relation Age of Onset   Cancer Mother        pancreatic   Pancreatitis Mother    Heart disease Mother    Pancreatic cancer Mother    Cancer Father        leukemia   Diabetes Father    Leukemia Father    Healthy Brother  Alzheimer's disease Maternal Grandmother    Social History[1] Allergies[2] Medications Ordered Prior to Encounter[3]  ASA and 1 hr GTT not indicated  Exam   Vitals:   09/18/24 1410  BP: 107/68  Pulse: 88  Weight: 149 lb 8 oz (67.8 kg)   Fetal Heart Rate (bpm): 165  Uterus:     Pelvic Exam: Perineum: no hemorrhoids, normal perineum   Vulva: normal external genitalia, no lesions   Vagina:  normal mucosa, normal discharge   Cervix: no lesions and normal, pap smear done.    Adnexa: normal adnexa and no mass, fullness, tenderness   Bony Pelvis: average  System: General: well-developed, well-nourished female in no acute distress   Breast:  normal appearance, no masses or tenderness   Skin: normal coloration and turgor, no rashes   Neurologic: oriented, normal, negative, normal mood   Extremities: normal strength, tone, and muscle mass, ROM of all joints is normal   HEENT PERRLA, extraocular movement intact and sclera clear, anicteric   Mouth/Teeth mucous membranes moist, pharynx normal without lesions and dental hygiene good   Neck supple and no masses   Cardiovascular: regular rate and rhythm   Respiratory:  no respiratory distress, normal breath sounds   Abdomen: soft, non-tender; bowel sounds normal; no masses,  no organomegaly     Assessment:   Pregnancy: H4E8968 Patient Active Problem List   Diagnosis Date Noted   Supervision of low-risk pregnancy 09/12/2024   Carrier of spinal muscular atrophy 03/18/2023   Recurrent pregnancy loss 09/24/2020     Plan:  1. Encounter for supervision of low-risk pregnancy in first trimester (Primary) Does have prenatal vitamins but hasn't  been taking them d/t nausea and vomiting - Culture, OB Urine - CBC/D/Plt+RPR+Rh+ABO+RubIgG... - Hemoglobin A1c - GC/Chlamydia probe amp ([place])not at [facility]  2. Pregnancy Discussed testing today, is interested in Panorama at this time, ordered. - PANORAMA PRENATAL TEST    Initial labs drawn. Continue prenatal vitamins. Discussed and offered genetic screening options, including Quad screen/AFP, NIPS testing, and option to decline testing. Benefits/risks/alternatives reviewed. Pt aware that anatomy US  is form of genetic screening with lower accuracy in detecting trisomies than blood work.  Pt chooses genetic screening today. First trimester screen, Quad screen, and NIPS: ordered. Ultrasound discussed; fetal anatomic survey: scheduled. Problem list reviewed and updated. The nature of [facility] with multiple MDs and other Advanced Practice Providers was explained to patient; also emphasized that residents, students are part of our team. Routine obstetric precautions reviewed.  Return in about 4 weeks (around 10/16/2024) for Routine prenatal visit, Female provider preferred.   Charlie DELENA Courts, MD 09/18/2024 3:06 PM     [1]  Social History Tobacco Use   Smoking status: Never    Passive exposure: Never   Smokeless tobacco: Never  Vaping Use   Vaping status: Never Used  Substance Use Topics   Alcohol use: Not Currently    Comment: socially   Drug use: Not Currently    Comment: 1 month ago  [2]  Allergies Allergen Reactions   Tomato Flavor [Flavoring Agent (Non-Screening)] Other (See Comments)    Bumps on tongue   Citrus Other (See Comments)    Bumps on tongue   [3]  Current Outpatient Medications on File Prior to Visit  Medication Sig Dispense Refill   Collagen-Vitamin C-Biotin (COLLAGEN PO) Take 2 tablets by mouth daily. (Patient not taking: Reported on 09/18/2024)     famotidine  (PEPCID ) 20 MG tablet Take  1 tablet (20 mg total) by  mouth 2 (two) times daily. (Patient not taking: Reported on 09/18/2024) 60 tablet 0   metoCLOPramide  (REGLAN ) 10 MG tablet Take 1 tablet (10 mg total) by mouth every 8 (eight) hours as needed. (Patient not taking: Reported on 09/18/2024) 30 tablet 2   ondansetron  (ZOFRAN ) 4 MG tablet Take 1 tablet (4 mg total) by mouth every 8 (eight) hours as needed for nausea or vomiting. (Patient not taking: Reported on 09/18/2024)     Prenatal Vit-Fe Fumarate-FA (MULTIVITAMIN-PRENATAL) 27-0.8 MG TABS tablet Take 1 tablet by mouth daily at 12 noon. (Patient not taking: Reported on 09/18/2024)     No current facility-administered medications on file prior to visit.   "

## 2024-09-19 LAB — CBC/D/PLT+RPR+RH+ABO+RUBIGG...
Antibody Screen: NEGATIVE
Basophils Absolute: 0 x10E3/uL (ref 0.0–0.2)
Basos: 0 %
EOS (ABSOLUTE): 0.1 x10E3/uL (ref 0.0–0.4)
Eos: 1 %
HCV Ab: NONREACTIVE
HIV Screen 4th Generation wRfx: NONREACTIVE
Hematocrit: 40.5 % (ref 34.0–46.6)
Hemoglobin: 12.8 g/dL (ref 11.1–15.9)
Hepatitis B Surface Ag: NEGATIVE
Immature Grans (Abs): 0 x10E3/uL (ref 0.0–0.1)
Immature Granulocytes: 0 %
Lymphocytes Absolute: 2.3 x10E3/uL (ref 0.7–3.1)
Lymphs: 29 %
MCH: 29.2 pg (ref 26.6–33.0)
MCHC: 31.6 g/dL (ref 31.5–35.7)
MCV: 92 fL (ref 79–97)
Monocytes Absolute: 0.5 x10E3/uL (ref 0.1–0.9)
Monocytes: 6 %
Neutrophils Absolute: 5.1 x10E3/uL (ref 1.4–7.0)
Neutrophils: 64 %
Platelets: 324 x10E3/uL (ref 150–450)
RBC: 4.39 x10E6/uL (ref 3.77–5.28)
RDW: 12.9 % (ref 11.7–15.4)
RPR Ser Ql: NONREACTIVE
Rh Factor: POSITIVE
Rubella Antibodies, IGG: 1.54 {index}
WBC: 7.9 x10E3/uL (ref 3.4–10.8)

## 2024-09-19 LAB — GC/CHLAMYDIA PROBE AMP (~~LOC~~) NOT AT ARMC
Chlamydia: NEGATIVE
Comment: NEGATIVE
Comment: NORMAL
Neisseria Gonorrhea: NEGATIVE

## 2024-09-19 LAB — HEMOGLOBIN A1C
Est. average glucose Bld gHb Est-mCnc: 108 mg/dL
Hgb A1c MFr Bld: 5.4 % (ref 4.8–5.6)

## 2024-09-19 LAB — HCV INTERPRETATION

## 2024-09-20 ENCOUNTER — Ambulatory Visit: Payer: Self-pay

## 2024-09-20 LAB — URINE CULTURE, OB REFLEX

## 2024-09-20 LAB — CULTURE, OB URINE

## 2024-09-25 LAB — PANORAMA PRENATAL TEST FULL PANEL:PANORAMA TEST PLUS 5 ADDITIONAL MICRODELETIONS: FETAL FRACTION: 7.6

## 2024-10-18 ENCOUNTER — Other Ambulatory Visit: Payer: Self-pay

## 2024-10-18 ENCOUNTER — Ambulatory Visit: Payer: Self-pay | Admitting: Family Medicine

## 2024-10-18 VITALS — BP 115/76 | HR 77 | Wt 151.5 lb

## 2024-10-18 DIAGNOSIS — Z3A16 16 weeks gestation of pregnancy: Secondary | ICD-10-CM

## 2024-10-18 DIAGNOSIS — O219 Vomiting of pregnancy, unspecified: Secondary | ICD-10-CM

## 2024-10-18 DIAGNOSIS — N96 Recurrent pregnancy loss: Secondary | ICD-10-CM

## 2024-10-18 DIAGNOSIS — Z148 Genetic carrier of other disease: Secondary | ICD-10-CM

## 2024-10-18 DIAGNOSIS — K117 Disturbances of salivary secretion: Secondary | ICD-10-CM

## 2024-10-18 DIAGNOSIS — Z3491 Encounter for supervision of normal pregnancy, unspecified, first trimester: Secondary | ICD-10-CM

## 2024-10-18 MED ORDER — SCOPOLAMINE 1 MG/3DAYS TD PT72: 1.0000 | MEDICATED_PATCH | TRANSDERMAL | 1 refills | Status: AC

## 2024-10-18 NOTE — Progress Notes (Deleted)
 "  PRENATAL VISIT NOTE  Subjective:  Sydney Woods is a 24 y.o. H4E8968 at [redacted]w[redacted]d being seen today for ongoing prenatal care.  She is currently monitored for the following issues for this low-risk pregnancy and has Recurrent pregnancy loss; Carrier of spinal muscular atrophy; and Supervision of low-risk pregnancy on their problem list.  Patient reports vomiting- about once per day-- using meds. Has lots of spitting sx.    . Vag. Bleeding: None.   . Denies leaking of fluid.   The following portions of the patient's history were reviewed and updated as appropriate: allergies, current medications, past family history, past medical history, past social history, past surgical history and problem list.   Objective:   Vitals:   10/18/24 1624  BP: 115/76  Pulse: 77  Weight: 151 lb 8 oz (68.7 kg)    Fetal Status:  Fetal Heart Rate (bpm): 151        General: Alert, oriented and cooperative. Patient is in no acute distress.  Skin: Skin is warm and dry. No rash noted.   Cardiovascular: Normal heart rate noted  Respiratory: Normal respiratory effort, no problems with respiration noted  Abdomen: Soft, gravid, appropriate for gestational age.  Pain/Pressure: Absent     Pelvic: {Blank single:19197::Cervical exam performed in the presence of a chaperone,Cervical exam deferred}        Extremities: Normal range of motion.     Mental Status: Normal mood and affect. Normal behavior. Normal judgment and thought content.      09/18/2024    3:02 PM 05/25/2023    3:53 PM 03/01/2023   10:27 AM  Depression screen PHQ 2/9  Decreased Interest 2 2 1   Down, Depressed, Hopeless 1 0 1  PHQ - 2 Score 3 2 2   Altered sleeping 1 0 2  Tired, decreased energy 2 1 3   Change in appetite 3 1 3   Feeling bad or failure about yourself  1 0 0  Trouble concentrating 1 0 0  Moving slowly or fidgety/restless 1 0 0  Suicidal thoughts 0 0 0  PHQ-9 Score 12 4  10       Data saved with a previous flowsheet row definition         09/18/2024    3:03 PM 05/25/2023    3:53 PM 03/01/2023   10:27 AM 09/09/2020    4:01 PM  GAD 7 : Generalized Anxiety Score  Nervous, Anxious, on Edge 1  1  3  1    Control/stop worrying 2  1  1  2    Worry too much - different things 2  1  1  2    Trouble relaxing 1  1  1  1    Restless 0  0  0  0   Easily annoyed or irritable 2  1  1  2    Afraid - awful might happen 0  1  1  1    Total GAD 7 Score 8 6 8 9      Data saved with a previous flowsheet row definition    Assessment and Plan:  Pregnancy: G5P1031 at [redacted]w[redacted]d 1. Encounter for supervision of low-risk pregnancy in first trimester (Primary) *** - AFP, Serum, Open Spina Bifida  2. Recurrent pregnancy loss ***  3. Carrier of spinal muscular atrophy ***  4. [redacted] weeks gestation of pregnancy ***  Preterm labor symptoms and general obstetric precautions including but not limited to vaginal bleeding, contractions, leaking of fluid and fetal movement were reviewed in detail with the patient.  Please refer to After Visit Summary for other counseling recommendations.   No follow-ups on file.  Future Appointments  Date Time Provider Department Center  11/09/2024  2:00 PM [facility] PROVIDER 1 [facility]  11/09/2024  2:30 PM [facility] US2 [facility]  11/15/2024  2:35 PM Sydney Moccasin, MD [facility]  12/14/2024  3:55 PM Sydney Burnard HERO, MD [facility]  01/11/2025  8:20 AM [facility] LAB [facility]  01/11/2025  9:15 AM [facility]GENERAL 2 [facility]    Sydney Maryan Masters, MD "

## 2024-10-18 NOTE — Progress Notes (Signed)
 "  PRENATAL VISIT NOTE  Subjective:  Sydney Woods is a 24 y.o. H4E8968 at [redacted]w[redacted]d being seen today for ongoing prenatal care.  She is currently monitored for the following issues for this low-risk pregnancy and has Recurrent pregnancy loss; Carrier of spinal muscular atrophy; and Supervision of low-risk pregnancy on their problem list.  Patient reports no complaints.   . Vag. Bleeding: None.  Movement: Present. Denies leaking of fluid.   The following portions of the patient's history were reviewed and updated as appropriate: allergies, current medications, past family history, past medical history, past social history, past surgical history and problem list.   Objective:   Vitals:   10/18/24 1624  BP: 115/76  Pulse: 77  Weight: 151 lb 8 oz (68.7 kg)    Fetal Status:  Fetal Heart Rate (bpm): 151 Fundal Height: 17 cm Movement: Present    General: Alert, oriented and cooperative. Patient is in no acute distress.  Skin: Skin is warm and dry. No rash noted.   Cardiovascular: Normal heart rate noted  Respiratory: Normal respiratory effort, no problems with respiration noted  Abdomen: Soft, gravid, appropriate for gestational age.  Pain/Pressure: Absent     Pelvic: Cervical exam deferred        Extremities: Normal range of motion.     Mental Status: Normal mood and affect. Normal behavior. Normal judgment and thought content.      09/18/2024    3:02 PM 05/25/2023    3:53 PM 03/01/2023   10:27 AM  Depression screen PHQ 2/9  Decreased Interest 2 2 1   Down, Depressed, Hopeless 1 0 1  PHQ - 2 Score 3 2 2   Altered sleeping 1 0 2  Tired, decreased energy 2 1 3   Change in appetite 3 1 3   Feeling bad or failure about yourself  1 0 0  Trouble concentrating 1 0 0  Moving slowly or fidgety/restless 1 0 0  Suicidal thoughts 0 0 0  PHQ-9 Score 12 4  10       Data saved with a previous flowsheet row definition        09/18/2024    3:03 PM 05/25/2023    3:53 PM 03/01/2023   10:27 AM  09/09/2020    4:01 PM  GAD 7 : Generalized Anxiety Score  Nervous, Anxious, on Edge 1  1  3  1    Control/stop worrying 2  1  1  2    Worry too much - different things 2  1  1  2    Trouble relaxing 1  1  1  1    Restless 0  0  0  0   Easily annoyed or irritable 2  1  1  2    Afraid - awful might happen 0  1  1  1    Total GAD 7 Score 8 6 8 9      Data saved with a previous flowsheet row definition    Assessment and Plan:  Pregnancy: G5P1031 at [redacted]w[redacted]d  1. Encounter for supervision of low-risk pregnancy in first trimester (Primary) TWG=6 lb 8 oz (2.948 kg)  Throws up every other day-- using meds well.   Feeling flutters FH appropriate  - AFP, Serum, Open Spina Bifida - scopolamine  (TRANSDERM-SCOP) 1 MG/3DAYS; Place 1 patch (1 mg total) onto the skin every 3 (three) days.  Dispense: 10 patch; Refill: 1  2. Recurrent pregnancy loss  3. Carrier of spinal muscular atrophy  4. [redacted] weeks gestation of pregnancy  5. Nausea and  vomiting during pregnancy prior to [redacted] weeks gestation Not losing weight - scopolamine  (TRANSDERM-SCOP) 1 MG/3DAYS; Place 1 patch (1 mg total) onto the skin every 3 (three) days.  Dispense: 10 patch; Refill: 1  6. Ptyalism - scopolamine  (TRANSDERM-SCOP) 1 MG/3DAYS; Place 1 patch (1 mg total) onto the skin every 3 (three) days.  Dispense: 10 patch; Refill: 1   Preterm labor symptoms and general obstetric precautions including but not limited to vaginal bleeding, contractions, leaking of fluid and fetal movement were reviewed in detail with the patient. Please refer to After Visit Summary for other counseling recommendations.   Return in about 4 weeks (around 11/15/2024) for Routine prenatal care, scheduled visit.  Future Appointments  Date Time Provider Department Center  11/09/2024  2:00 PM [facility] PROVIDER 1 [facility]  11/09/2024  2:30 PM [facility] US2 [facility]  11/15/2024  2:35 PM Cleatus Moccasin, MD [facility]  12/14/2024  3:55 PM Nicholaus Burnard HERO, MD [facility]   01/11/2025  8:20 AM [facility] LAB [facility]  01/11/2025  9:15 AM [facility]GENERAL 2 [facility]    Suzen Maryan Masters, MD  "

## 2024-10-20 LAB — AFP, SERUM, OPEN SPINA BIFIDA
AFP MoM: 1.38
AFP Value: 49.2 ng/mL
Gest. Age on Collection Date: 16 wk
Maternal Age At EDD: 26.4 a
OSBR Risk 1 IN: 7620
Test Results:: NEGATIVE
Weight: 152 [lb_av]

## 2024-11-09 ENCOUNTER — Other Ambulatory Visit

## 2024-11-09 ENCOUNTER — Ambulatory Visit

## 2024-11-15 ENCOUNTER — Encounter: Payer: Self-pay | Admitting: Obstetrics and Gynecology

## 2024-12-14 ENCOUNTER — Encounter: Payer: Self-pay | Admitting: Obstetrics and Gynecology

## 2025-01-11 ENCOUNTER — Other Ambulatory Visit: Payer: Self-pay
